# Patient Record
Sex: Female | Born: 1935 | Race: White | Hispanic: No | Smoking: Never smoker
Health system: Southern US, Community
[De-identification: ages and names within clinical notes are randomized; demographics above are authoritative.]

## PROBLEM LIST (undated history)

## (undated) DIAGNOSIS — C50919 Malignant neoplasm of unspecified site of unspecified female breast: Secondary | ICD-10-CM

## (undated) DIAGNOSIS — Z923 Personal history of irradiation: Secondary | ICD-10-CM

## (undated) DIAGNOSIS — L570 Actinic keratosis: Secondary | ICD-10-CM

## (undated) DIAGNOSIS — B0239 Other herpes zoster eye disease: Secondary | ICD-10-CM

## (undated) DIAGNOSIS — C801 Malignant (primary) neoplasm, unspecified: Secondary | ICD-10-CM

## (undated) DIAGNOSIS — I2699 Other pulmonary embolism without acute cor pulmonale: Secondary | ICD-10-CM

## (undated) DIAGNOSIS — E785 Hyperlipidemia, unspecified: Secondary | ICD-10-CM

## (undated) HISTORY — PX: COLPOSCOPY: SHX161

## (undated) HISTORY — DX: Other herpes zoster eye disease: B02.39

## (undated) HISTORY — DX: Actinic keratosis: L57.0

## (undated) HISTORY — DX: Hyperlipidemia, unspecified: E78.5

## (undated) HISTORY — DX: Malignant (primary) neoplasm, unspecified: C80.1

---
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
## 2000-09-08 DIAGNOSIS — C50919 Malignant neoplasm of unspecified site of unspecified female breast: Secondary | ICD-10-CM

## 2000-09-08 DIAGNOSIS — C801 Malignant (primary) neoplasm, unspecified: Secondary | ICD-10-CM

## 2000-09-08 DIAGNOSIS — Z923 Personal history of irradiation: Secondary | ICD-10-CM

## 2000-09-08 HISTORY — PX: BREAST BIOPSY: SHX20

## 2000-09-08 HISTORY — DX: Malignant neoplasm of unspecified site of unspecified female breast: C50.919

## 2000-09-08 HISTORY — DX: Personal history of irradiation: Z92.3

## 2000-09-08 HISTORY — PX: BREAST SURGERY: SHX581

## 2000-09-08 HISTORY — DX: Malignant (primary) neoplasm, unspecified: C80.1

## 2004-07-02 ENCOUNTER — Ambulatory Visit: Payer: Self-pay | Admitting: Oncology

## 2004-07-09 ENCOUNTER — Ambulatory Visit: Payer: Self-pay | Admitting: Oncology

## 2004-12-31 ENCOUNTER — Ambulatory Visit: Payer: Self-pay | Admitting: Oncology

## 2005-01-06 ENCOUNTER — Ambulatory Visit: Payer: Self-pay | Admitting: Oncology

## 2005-02-26 ENCOUNTER — Ambulatory Visit: Payer: Self-pay | Admitting: Oncology

## 2005-04-28 ENCOUNTER — Ambulatory Visit: Payer: Self-pay | Admitting: Radiation Oncology

## 2005-07-01 ENCOUNTER — Ambulatory Visit: Payer: Self-pay | Admitting: Oncology

## 2005-12-29 ENCOUNTER — Ambulatory Visit: Payer: Self-pay | Admitting: Oncology

## 2006-01-06 ENCOUNTER — Ambulatory Visit: Payer: Self-pay | Admitting: Oncology

## 2006-03-02 ENCOUNTER — Ambulatory Visit: Payer: Self-pay | Admitting: Oncology

## 2006-03-26 ENCOUNTER — Ambulatory Visit: Payer: Self-pay | Admitting: Oncology

## 2006-04-08 ENCOUNTER — Ambulatory Visit: Payer: Self-pay | Admitting: Oncology

## 2006-06-29 ENCOUNTER — Ambulatory Visit: Payer: Self-pay | Admitting: Oncology

## 2006-07-09 ENCOUNTER — Ambulatory Visit: Payer: Self-pay | Admitting: Oncology

## 2006-09-09 ENCOUNTER — Ambulatory Visit: Payer: Self-pay | Admitting: Oncology

## 2007-01-07 ENCOUNTER — Ambulatory Visit: Payer: Self-pay | Admitting: Oncology

## 2007-01-11 ENCOUNTER — Ambulatory Visit: Payer: Self-pay | Admitting: Oncology

## 2007-02-07 ENCOUNTER — Ambulatory Visit: Payer: Self-pay | Admitting: Oncology

## 2007-04-07 ENCOUNTER — Ambulatory Visit: Payer: Self-pay | Admitting: Oncology

## 2007-07-10 ENCOUNTER — Ambulatory Visit: Payer: Self-pay | Admitting: Oncology

## 2007-07-12 ENCOUNTER — Ambulatory Visit: Payer: Self-pay | Admitting: Oncology

## 2007-08-09 ENCOUNTER — Ambulatory Visit: Payer: Self-pay | Admitting: Oncology

## 2008-01-07 ENCOUNTER — Ambulatory Visit: Payer: Self-pay | Admitting: Oncology

## 2008-01-10 ENCOUNTER — Ambulatory Visit: Payer: Self-pay | Admitting: Oncology

## 2008-02-07 ENCOUNTER — Ambulatory Visit: Payer: Self-pay | Admitting: Oncology

## 2008-04-08 ENCOUNTER — Ambulatory Visit: Payer: Self-pay | Admitting: Oncology

## 2008-04-10 ENCOUNTER — Ambulatory Visit: Payer: Self-pay | Admitting: Oncology

## 2008-04-19 ENCOUNTER — Ambulatory Visit: Payer: Self-pay | Admitting: Oncology

## 2008-06-02 LAB — BASIC METABOLIC PANEL
BUN: 19 mg/dL (ref 4–21)
Creatinine: 1 mg/dL (ref 0.5–1.1)
GLUCOSE: 92 mg/dL
Potassium: 4.4 mmol/L (ref 3.4–5.3)
Sodium: 142 mmol/L (ref 137–147)

## 2008-06-02 LAB — CBC AND DIFFERENTIAL
HEMATOCRIT: 40 % (ref 36–46)
Hemoglobin: 13.3 g/dL (ref 12.0–16.0)
NEUTROS ABS: 4 /uL
Platelets: 221 10*3/uL (ref 150–399)
WBC: 5.9 10*3/mL

## 2008-06-02 LAB — HEPATIC FUNCTION PANEL
ALK PHOS: 68 U/L (ref 25–125)
ALT: 15 U/L (ref 7–35)
AST: 18 U/L (ref 13–35)
Bilirubin, Total: 0.5 mg/dL

## 2008-06-14 ENCOUNTER — Ambulatory Visit: Payer: Self-pay | Admitting: Oncology

## 2008-07-09 ENCOUNTER — Ambulatory Visit: Payer: Self-pay | Admitting: Oncology

## 2008-07-25 ENCOUNTER — Ambulatory Visit: Payer: Self-pay | Admitting: Oncology

## 2008-08-08 ENCOUNTER — Ambulatory Visit: Payer: Self-pay | Admitting: Oncology

## 2009-02-06 ENCOUNTER — Ambulatory Visit: Payer: Self-pay | Admitting: Oncology

## 2009-02-07 ENCOUNTER — Ambulatory Visit: Payer: Self-pay | Admitting: Oncology

## 2009-03-08 ENCOUNTER — Ambulatory Visit: Payer: Self-pay | Admitting: Oncology

## 2009-04-23 ENCOUNTER — Ambulatory Visit: Payer: Self-pay | Admitting: Oncology

## 2009-05-07 ENCOUNTER — Ambulatory Visit: Payer: Self-pay | Admitting: Oncology

## 2009-05-09 ENCOUNTER — Ambulatory Visit: Payer: Self-pay | Admitting: Oncology

## 2009-05-17 ENCOUNTER — Ambulatory Visit: Payer: Self-pay | Admitting: Oncology

## 2009-06-08 ENCOUNTER — Ambulatory Visit: Payer: Self-pay | Admitting: Oncology

## 2009-08-08 ENCOUNTER — Ambulatory Visit: Payer: Self-pay | Admitting: Oncology

## 2009-08-15 ENCOUNTER — Ambulatory Visit: Payer: Self-pay | Admitting: Oncology

## 2009-08-21 LAB — LIPID PANEL
CHOLESTEROL: 211 mg/dL — AB (ref 0–200)
HDL: 58 mg/dL (ref 35–70)
LDL Cholesterol: 141 mg/dL
LDl/HDL Ratio: 2.4
Triglycerides: 60 mg/dL (ref 40–160)

## 2009-09-08 ENCOUNTER — Ambulatory Visit: Payer: Self-pay | Admitting: Oncology

## 2009-11-06 ENCOUNTER — Ambulatory Visit: Payer: Self-pay | Admitting: General Surgery

## 2010-02-06 ENCOUNTER — Ambulatory Visit: Payer: Self-pay | Admitting: Oncology

## 2010-02-13 ENCOUNTER — Ambulatory Visit: Payer: Self-pay | Admitting: Oncology

## 2010-03-08 ENCOUNTER — Ambulatory Visit: Payer: Self-pay | Admitting: Oncology

## 2010-08-15 ENCOUNTER — Ambulatory Visit: Payer: Self-pay | Admitting: Oncology

## 2010-08-16 LAB — CANCER ANTIGEN 27.29: CA 27.29: 11.2 U/mL (ref 0.0–38.6)

## 2010-09-08 ENCOUNTER — Ambulatory Visit: Payer: Self-pay | Admitting: Oncology

## 2010-11-11 ENCOUNTER — Ambulatory Visit: Payer: Self-pay | Admitting: Oncology

## 2011-02-17 ENCOUNTER — Ambulatory Visit: Payer: Self-pay | Admitting: Oncology

## 2011-02-18 LAB — CANCER ANTIGEN 27.29: CA 27.29: 7.1 U/mL (ref 0.0–38.6)

## 2011-03-09 ENCOUNTER — Ambulatory Visit: Payer: Self-pay | Admitting: Oncology

## 2011-08-19 ENCOUNTER — Ambulatory Visit: Payer: Self-pay | Admitting: Oncology

## 2011-09-09 ENCOUNTER — Ambulatory Visit: Payer: Self-pay | Admitting: Oncology

## 2011-11-12 ENCOUNTER — Ambulatory Visit: Payer: Self-pay | Admitting: Oncology

## 2012-02-17 ENCOUNTER — Ambulatory Visit: Payer: Self-pay | Admitting: Oncology

## 2012-02-18 LAB — CANCER ANTIGEN 27.29: CA 27.29: 12.2 U/mL (ref 0.0–38.6)

## 2012-03-08 ENCOUNTER — Ambulatory Visit: Payer: Self-pay | Admitting: Oncology

## 2012-11-15 ENCOUNTER — Ambulatory Visit: Payer: Self-pay | Admitting: Oncology

## 2013-02-15 ENCOUNTER — Ambulatory Visit: Payer: Self-pay | Admitting: Oncology

## 2013-03-08 ENCOUNTER — Ambulatory Visit: Payer: Self-pay | Admitting: Oncology

## 2013-06-02 ENCOUNTER — Ambulatory Visit: Payer: Self-pay | Admitting: Ophthalmology

## 2013-06-02 DIAGNOSIS — I10 Essential (primary) hypertension: Secondary | ICD-10-CM

## 2013-06-15 ENCOUNTER — Ambulatory Visit: Payer: Self-pay | Admitting: Ophthalmology

## 2013-07-20 ENCOUNTER — Ambulatory Visit: Payer: Self-pay | Admitting: Ophthalmology

## 2013-11-16 ENCOUNTER — Ambulatory Visit: Payer: Self-pay | Admitting: Oncology

## 2014-02-20 ENCOUNTER — Ambulatory Visit: Payer: Self-pay | Admitting: Oncology

## 2014-02-21 LAB — CANCER ANTIGEN 27.29: CA 27.29: 13.5 U/mL (ref 0.0–38.6)

## 2014-03-08 ENCOUNTER — Ambulatory Visit: Payer: Self-pay | Admitting: Oncology

## 2014-11-21 ENCOUNTER — Ambulatory Visit: Payer: Self-pay | Admitting: Oncology

## 2014-12-11 ENCOUNTER — Encounter: Payer: Self-pay | Admitting: General Surgery

## 2014-12-13 ENCOUNTER — Ambulatory Visit (INDEPENDENT_AMBULATORY_CARE_PROVIDER_SITE_OTHER): Payer: Medicare Other | Admitting: General Surgery

## 2014-12-13 ENCOUNTER — Encounter: Payer: Self-pay | Admitting: General Surgery

## 2014-12-13 VITALS — BP 122/68 | HR 64 | Resp 14 | Ht 62.0 in | Wt 170.0 lb

## 2014-12-13 DIAGNOSIS — K6289 Other specified diseases of anus and rectum: Secondary | ICD-10-CM | POA: Insufficient documentation

## 2014-12-13 DIAGNOSIS — R198 Other specified symptoms and signs involving the digestive system and abdomen: Secondary | ICD-10-CM | POA: Diagnosis not present

## 2014-12-13 LAB — POC HEMOCCULT BLD/STL (OFFICE/1-CARD/DIAGNOSTIC): Fecal Occult Blood, POC: NEGATIVE

## 2014-12-13 MED ORDER — DOXYCYCLINE HYCLATE 100 MG PO CAPS: 100.0000 mg | ORAL_CAPSULE | Freq: Two times a day (BID) | ORAL | Status: DC

## 2014-12-13 NOTE — Patient Instructions (Signed)
The patient is aware to call back for any questions or concerns.  

## 2014-12-13 NOTE — Progress Notes (Signed)
Patient ID: JAVAEH Powell, female   DOB: 08-19-1934, 81 y.o.   MRN: [id number]  Chief Complaint  Patient presents with  . Other    hemorrhoids    HPI Renee Powell is a 81 y.o. female here today for a evaluation of possible hemorrhoids. Patient states she noticed this about a month ago. She states there is "something protruding from rectum". She states it has not changed in size. No bleeding. Bowels move generally every other day. She states she would have a BM and then about one hour later she would notice more BM that would seep out. She has been using some cream for about 2 weeks which has not helped.  She has never had a colonoscopy.   HPI  Past Medical History  Diagnosis Date  . Cancer 2002    breast    Past Surgical History  Procedure Laterality Date  . Breast surgery Right 2002    lumpectomy    Family History  Problem Relation Age of Onset  . Cancer Father     Social History History  Substance Use Topics  . Smoking status: Never Smoker   . Smokeless tobacco: Never Used  . Alcohol Use: No    No Known Allergies  Current Outpatient Prescriptions  Medication Sig Dispense Refill  . PROCTOSOL HC 2.5 % rectal cream   0  . SYNTHROID 50 MCG tablet   11  . doxycycline (VIBRAMYCIN) 100 MG capsule Take 1 capsule (100 mg total) by mouth 2 (two) times daily. 20 capsule 0   No current facility-administered medications for this visit.    Review of Systems Review of Systems  Constitutional: Negative.   Respiratory: Negative.   Cardiovascular: Negative.   Gastrointestinal: Negative.     Blood pressure 122/68, pulse 64, resp. rate 14, height 5\' 2"  (1.575 m), weight 170 lb (77.111 kg).  Physical Exam Physical Exam  Constitutional: She is oriented to person, place, and time. She appears well-developed and well-nourished.  Cardiovascular: Normal rate, regular rhythm and normal heart sounds.   Pulses:      Femoral pulses are 2+ on the right side, and 2+ on the left  side. Pulmonary/Chest: Effort normal and breath sounds normal.  Abdominal: Soft. Normal appearance. There is no tenderness.  Genitourinary: Rectal exam shows no external hemorrhoid, no fissure, no mass and anal tone normal. Guaiac negative stool.     Tiny skin cyst at left anterior perianal skin.  With vigorous straining no prolapsing tissue through the sphincter. Normal sphincter tone. No spasm. No rectal tenderness. No rectal masses within reach of the index finger. Stool was brown, Hemoccult negative.  Anoscopy lower rectal mucosa. No internal hemorrhoids. No fissure.  Neurological: She is alert and oriented to person, place, and time.  Skin: Skin is warm and dry.    Data Reviewed Face-to-face discussion with PCP.  Assessment    Possible dermal skin cyst with local inflammation. No explanation for reported intermittent stool seepage.    Plan    The patient will be placed on doxycycline 100 mg by mouth twice a day for a ten-day course. We'll reassess in 2 weeks. She'll continue to use her hydrocortisone cream as previously instructed.     PCP:  Cranford Mon, Federico Flake 12/13/2014, 4:40 PM

## 2014-12-20 ENCOUNTER — Ambulatory Visit: Payer: Self-pay | Admitting: General Surgery

## 2014-12-26 ENCOUNTER — Encounter: Payer: Self-pay | Admitting: General Surgery

## 2014-12-26 ENCOUNTER — Ambulatory Visit (INDEPENDENT_AMBULATORY_CARE_PROVIDER_SITE_OTHER): Payer: Medicare Other | Admitting: General Surgery

## 2014-12-26 VITALS — BP 110/64 | HR 64 | Resp 12 | Ht 62.0 in | Wt 170.0 lb

## 2014-12-26 DIAGNOSIS — R198 Other specified symptoms and signs involving the digestive system and abdomen: Secondary | ICD-10-CM | POA: Diagnosis not present

## 2014-12-26 DIAGNOSIS — K6289 Other specified diseases of anus and rectum: Secondary | ICD-10-CM

## 2014-12-26 NOTE — Progress Notes (Signed)
Patient ID: Renee Powell, female   DOB: [birth year], 81 y.o.   MRN: [id number]  Chief Complaint  Patient presents with  . Follow-up    perianal cyst    HPI Renee Powell is a 81 y.o. female.  Patient here for two week follow up perianal cyst. Patient states she is doing well. She tolerated the trial of doxycycline with only mild nausea, no vomiting. She was able to complete the prescription. She reports no longer sensing anything protruding from the anus and much less local anal discomfort. HPI  Past Medical History  Diagnosis Date  . Cancer 2002    breast    Past Surgical History  Procedure Laterality Date  . Breast surgery Right 2002    lumpectomy    Family History  Problem Relation Age of Onset  . Cancer Father     Social History History  Substance Use Topics  . Smoking status: Never Smoker   . Smokeless tobacco: Never Used  . Alcohol Use: No    No Known Allergies  Current Outpatient Prescriptions  Medication Sig Dispense Refill  . PROCTOSOL HC 2.5 % rectal cream   0  . SYNTHROID 50 MCG tablet   11   No current facility-administered medications for this visit.    Review of Systems Review of Systems  Constitutional: Negative.   Respiratory: Negative.   Cardiovascular: Negative.     Blood pressure 110/64, pulse 64, resp. rate 12, height 5\' 2"  (1.575 m), weight 170 lb (77.111 kg).  Physical Exam Physical Exam  Constitutional: She is oriented to person, place, and time. She appears well-developed and well-nourished.  Eyes: Conjunctivae are normal. Pupils are equal, round, and reactive to light.  Cardiovascular: Normal rate, regular rhythm and normal heart sounds.   Pulmonary/Chest: Effort normal and breath sounds normal.  Genitourinary:     Neurological: She is alert and oriented to person, place, and time.  Skin: Skin is warm and dry.      Assessment    Resolution of perianal pain.    Plan    The patient will report if she has any  recurrent symptoms. Follow up otherwise will be on an as-needed basis.  PCP: Philemon Kingdom 12/27/2014, 7:30 AM

## 2014-12-29 NOTE — Op Note (Signed)
PATIENT NAME:  Renee Powell, Renee Powell MR#:  [id number] DATE OF BIRTH:  01/23/1934  DATE OF PROCEDURE:  07/20/2013  PREOPERATIVE DIAGNOSIS:  Senile cataract left eye.  POSTOPERATIVE DIAGNOSIS:  Senile cataract left eye.  PROCEDURE:  Phacoemulsification with posterior chamber intraocular lens implantation of the left eye.  LENS IMPLANT:  ZCBOO 12.5-diopter posterior chamber intraocular lens.  ULTRASOUND TIME:  13% of  55 seconds.  CDE 7.0.  SURGEON:  Mali Deontrey Massi, MD  ANESTHESIA:  Topical with tetracaine drops and 2% Xylocaine jelly.  COMPLICATIONS:  None.  DESCRIPTION OF PROCEDURE:  The patient was identified in the holding room and transported to the operating room and placed in the supine position under the operating microscope.  The left eye was identified as the operative eye and it was prepped and draped in the usual sterile ophthalmic fashion.  A 1 millimeter clear-corneal paracentesis was made at the 1:30 position.  The anterior chamber was filled with Viscoat viscoelastic.  A 2.4 millimeter keratome was used to make a near-clear corneal incision at the 10:30 position.  A curvilinear capsulorrhexis was made with a cystotome and capsulorrhexis forceps.  Balanced salt solution was used to hydrodissect and hydrodelineate the nucleus.  Phacoemulsification was then used in stop and chop fashion to remove the lens nucleus and epinucleus.  The remaining cortex was then removed using the irrigation and aspiration handpiece. Provisc was then placed into the capsular bag to distend it for lens placement.  A ZCBOO 12.5-diopter lens was then injected into the capsular bag.  The remaining viscoelastic was aspirated.  Wounds were hydrated with balanced salt solution.  The anterior chamber was inflated to a physiologic pressure with balanced salt solution.  0.1 mL of cefuroxime 10 mg/mL were injected into the anterior chamber for a dose of 1 mg of intracameral antibiotic at the completion of the case.  Miostat was placed into the anterior chamber to constrict the pupil.  No wound leaks were noted.  Topical Vigamox drops and Maxitrol ointment were applied to the eye.    The patient was taken to the recovery room in stable condition without complications of anesthesia or surgery.   ____________________________ Wyonia Hough, MD crb:cs D: 07/20/2013 14:45:09 ET T: 07/20/2013 16:06:38 ET JOB#: [id number]  cc: Wyonia Hough, MD, <Dictator> Leandrew Koyanagi MD ELECTRONICALLY SIGNED 07/27/2013 12:22

## 2014-12-29 NOTE — Op Note (Signed)
PATIENT NAME:  Renee Powell, GAL MR#:  [id number] DATE OF BIRTH:  [birth year]  DATE OF PROCEDURE:  06/15/2013  PREOPERATIVE DIAGNOSIS:  Senile cataract, right eye.  POSTOPERATIVE DIAGNOSIS:  Senile cataract, right eye.  PROCEDURE:  Phacoemulsification with posterior chamber intraocular lens implantation of the right eye.  LENS:  ZCB00 13.0-diopter posterior chamber intraocular lens.  ULTRASOUND TIME:  13% of 1 minute, 12 seconds.  CDE 9.2.  SURGEON:  Mali Angila Wombles, MD  ANESTHESIA:  Topical with tetracaine drops and 2% Xylocaine jelly.  COMPLICATIONS:  None.  DESCRIPTION OF PROCEDURE:  The patient was identified in the holding room and transported to the operating room and placed in the supine position under the operating microscope.  The right eye was identified as the operative eye and it was prepped and draped in the usual sterile ophthalmic fashion.  A 1 millimeter clear-corneal paracentesis was made at the 12 o'clock  position.  The anterior chamber was filled with Viscoat viscoelastic.  A 2.4 millimeter keratome was used to make a near-clear corneal incision at the 9 o'clock  position.  A curvilinear capsulorrhexis was made with a cystotome and capsulorrhexis forceps.  Balanced salt solution was used to hydrodissect and hydrodelineate the nucleus.  Phacoemulsification was then used in stop and chop fashion to remove the lens nucleus and epinucleus.  The remaining cortex was then removed using the irrigation and aspiration handpiece. Provisc was then placed into the capsular bag to distend it for lens placement.  A ZCB00 13.5-diopter lens was then injected into the capsular bag.  The remaining viscoelastic was aspirated.  Wounds were hydrated with balanced salt solution.  The anterior chamber was inflated to a physiologic pressure with balanced salt solution.  0.1 mL of cefuroxime 10 mg/mL were injected into the anterior chamber for a dose of 1 mg of intracameral antibiotic at the  completion of the case. Miostat was placed into the anterior chamber to constrict the pupil.  No wound leaks were noted.  Topical Vigamox drops and Maxitrol ointment were applied to the eye.  The patient was taken to the recovery room in stable condition without complications of anesthesia or surgery.    ____________________________ Wyonia Hough, MD crb:dmm D: 06/15/2013 15:24:00 ET T: 06/15/2013 16:30:21 ET JOB#: [id number]  cc: Wyonia Hough, MD, <Dictator>   Leandrew Koyanagi MD ELECTRONICALLY SIGNED 06/22/2013 12:48

## 2015-02-22 ENCOUNTER — Ambulatory Visit: Payer: Self-pay | Admitting: Oncology

## 2015-03-22 ENCOUNTER — Inpatient Hospital Stay: Payer: Medicare Other | Attending: Oncology | Admitting: Oncology

## 2015-03-22 ENCOUNTER — Encounter: Payer: Self-pay | Admitting: Oncology

## 2015-03-22 VITALS — BP 146/85 | HR 61 | Temp 95.6°F | Resp 18 | Ht 65.0 in | Wt 164.2 lb

## 2015-03-22 DIAGNOSIS — Z79899 Other long term (current) drug therapy: Secondary | ICD-10-CM | POA: Insufficient documentation

## 2015-03-22 DIAGNOSIS — Z853 Personal history of malignant neoplasm of breast: Secondary | ICD-10-CM | POA: Diagnosis present

## 2015-03-22 DIAGNOSIS — M8580 Other specified disorders of bone density and structure, unspecified site: Secondary | ICD-10-CM | POA: Diagnosis not present

## 2015-03-22 DIAGNOSIS — Z809 Family history of malignant neoplasm, unspecified: Secondary | ICD-10-CM | POA: Diagnosis not present

## 2015-03-22 DIAGNOSIS — C50911 Malignant neoplasm of unspecified site of right female breast: Secondary | ICD-10-CM

## 2015-04-07 DIAGNOSIS — C50911 Malignant neoplasm of unspecified site of right female breast: Secondary | ICD-10-CM | POA: Insufficient documentation

## 2015-04-07 NOTE — Progress Notes (Signed)
Renee Powell  Telephone:([phone]) [phone] Fax:(336) [phone]  ID: Renee Powell OB: [birth year]  MR#: [id number]  SEG#:[id number]  Patient Care Team: Jerrol Banana., MD as PCP - General (Family Medicine) Jerrol Banana., MD (Family Medicine) Robert Bellow, MD (General Surgery)  CHIEF COMPLAINT:  Chief Complaint  Patient presents with  . Follow-up    Breast Cancer    INTERVAL HISTORY: Patient returns to clinic today for routine yearly followup and evaluation.  She continues to feel well and remains asymptomatic.  She denies any neurologic complaints.  She has no fevers, chills, night sweats, or weight loss. She has a good appetite. She denies any chest pain, shortness of breath, or cough.  She denies any nausea, vomiting, constipation, or diarrhea.  She has no bony pain.  Patient feels at her baseline today and offers no specific complaints today.  REVIEW OF SYSTEMS:   Review of Systems  Constitutional: Negative.   Respiratory: Negative.   Cardiovascular: Negative.   Musculoskeletal: Negative.     As per HPI. Otherwise, a complete review of systems is negatve.  PAST MEDICAL HISTORY: Past Medical History  Diagnosis Date  . Cancer 2002    breast    PAST SURGICAL HISTORY: Past Surgical History  Procedure Laterality Date  . Breast surgery Right 2002    lumpectomy    FAMILY HISTORY Family History  Problem Relation Age of Onset  . Cancer Father   . Cancer Maternal Uncle   . Cancer Paternal Aunt     Breast  . Cancer Paternal Uncle     Kidney  . Cancer Paternal Grandmother     Breast  . Cancer Paternal Uncle     Lung       ADVANCED DIRECTIVES:    HEALTH MAINTENANCE: History  Substance Use Topics  . Smoking status: Never Smoker   . Smokeless tobacco: Never Used  . Alcohol Use: No     Colonoscopy:  PAP:  Bone density:  Lipid panel:  No Known Allergies  Current Outpatient Prescriptions  Medication Sig Dispense Refill    . SYNTHROID 50 MCG tablet   11   No current facility-administered medications for this visit.    OBJECTIVE: Filed Vitals:   03/22/15 1124  BP: 146/85  Pulse: 61  Temp: 95.6 F (35.3 C)  Resp: 18     Body mass index is 27.33 kg/(m^2).    ECOG FS:0 - Asymptomatic  General: Well-developed, well-nourished, no acute distress. Eyes: Pink conjunctiva, anicteric sclera. Breasts: Bilateral breast and axilla without lumps or masses. Lungs: Clear to auscultation bilaterally. Heart: Regular rate and rhythm. No rubs, murmurs, or gallops. Abdomen: Soft, nontender, nondistended. No organomegaly noted, normoactive bowel sounds. Musculoskeletal: No edema, cyanosis, or clubbing. Neuro: Alert, answering all questions appropriately. Cranial nerves grossly intact. Skin: No rashes or petechiae noted. Psych: Normal affect.   LAB RESULTS:  No results found for: NA, K, CL, CO2, GLUCOSE, BUN, CREATININE, CALCIUM, PROT, ALBUMIN, AST, ALT, ALKPHOS, BILITOT, GFRNONAA, GFRAA  No results found for: WBC, NEUTROABS, HGB, HCT, MCV, PLT   STUDIES: No results found.  ASSESSMENT: Stage IIa adenocarcinoma the right breast status post lumpectomy and XRT.  PLAN:    1. Breast cancer: No evidence of disease. Patient's most recent mammogram on November 21, 2014 was reported as BI-RADS 2.  Repeat in March 2017. Return to clinic in one year for routine evaluation.  Patient has now completed her NSABP protocol and will be in yearly followup until  2017, at which time she can be discharged from clinic.  She understands she can to return to clinic at any time if she has any questions, concerns, or complaints. 2.  Osteopenia: Previously, patient discontinued her Fosamax and does not wish to restart.  Continue calcium and vitamin D. Patient refuses any further bone mineral density examinations.   Patient expressed understanding and was in agreement with this plan. She also understands that She can call clinic at any time  with any questions, concerns, or complaints.   Breast cancer   Staging form: Breast, AJCC 7th Edition     Clinical stage from 04/07/2015: Stage IIA (T2, N0, M0) - Signed by Lloyd Huger, MD on 04/07/2015   Lloyd Huger, MD   04/07/2015 2:39 PM

## 2015-06-05 ENCOUNTER — Ambulatory Visit: Payer: Self-pay | Admitting: Family Medicine

## 2015-07-04 DIAGNOSIS — E785 Hyperlipidemia, unspecified: Secondary | ICD-10-CM | POA: Insufficient documentation

## 2015-07-04 DIAGNOSIS — L720 Epidermal cyst: Secondary | ICD-10-CM | POA: Insufficient documentation

## 2015-07-04 DIAGNOSIS — E079 Disorder of thyroid, unspecified: Secondary | ICD-10-CM | POA: Insufficient documentation

## 2015-07-11 ENCOUNTER — Encounter: Payer: Self-pay | Admitting: Family Medicine

## 2015-07-11 ENCOUNTER — Ambulatory Visit (INDEPENDENT_AMBULATORY_CARE_PROVIDER_SITE_OTHER): Payer: Medicare Other | Admitting: Family Medicine

## 2015-07-11 VITALS — BP 122/62 | HR 68 | Temp 97.5°F | Resp 16 | Wt 165.0 lb

## 2015-07-11 DIAGNOSIS — E079 Disorder of thyroid, unspecified: Secondary | ICD-10-CM

## 2015-07-11 DIAGNOSIS — E785 Hyperlipidemia, unspecified: Secondary | ICD-10-CM

## 2015-07-11 NOTE — Progress Notes (Signed)
Patient ID: Renee Powell, female   DOB: November 16, 1933, 81 y.o.   MRN: [id number]    Subjective:  HPI Pt is here for a 6 month follow up on her thyroid. According to our records she has not had labs since 2012. She will need labs today. Pt reports that she is feeling fine and is doing well.   Pt declines flu vaccine today.  Prior to Admission medications   Medication Sig Start Date End Date Taking? Authorizing Provider  SYNTHROID 50 MCG tablet  10/09/14  Yes Historical Provider, MD  fenofibrate micronized (LOFIBRA) 134 MG capsule Take by mouth.    Historical Provider, MD    Patient Active Problem List   Diagnosis Date Noted  . HLD (hyperlipidemia) 07/04/2015  . Dermoid inclusion cyst 07/04/2015  . Disease of thyroid gland 07/04/2015  . Breast cancer ([place]) 04/07/2015  . Perianal cyst 12/13/2014    Past Medical History  Diagnosis Date  . Cancer [facility]) 2002    breast    Social History   Social History  . Marital Status: Married    Spouse Name: N/A  . Number of Children: N/A  . Years of Education: N/A   Occupational History  . Not on file.   Social History Main Topics  . Smoking status: Never Smoker   . Smokeless tobacco: Never Used  . Alcohol Use: No  . Drug Use: No  . Sexual Activity: Not on file   Other Topics Concern  . Not on file   Social History Narrative    No Known Allergies  Review of Systems  Constitutional: Negative.   HENT: Negative.   Eyes: Negative.   Respiratory: Negative.   Cardiovascular: Negative.   Gastrointestinal: Negative.   Genitourinary: Negative.   Musculoskeletal: Negative.   Skin: Negative.   Neurological: Negative.   Endo/Heme/Allergies: Negative.   Psychiatric/Behavioral: Negative.      There is no immunization history on file for this patient. Objective:  BP 122/62 mmHg  Pulse 68  Temp(Src) 97.5 F (36.4 C) (Oral)  Resp 16  Wt 165 lb (74.844 kg)  Physical Exam  Constitutional: She is oriented to person, place, and  time and well-developed, well-nourished, and in no distress.  HENT:  Head: Normocephalic and atraumatic.  Right Ear: External ear normal.  Nose: Nose normal.  Eyes: Conjunctivae and EOM are normal. Pupils are equal, round, and reactive to light.  Neck: Normal range of motion. Neck supple.  Cardiovascular: Normal rate, regular rhythm, normal heart sounds and intact distal pulses.   Pulmonary/Chest: Effort normal and breath sounds normal.  Abdominal: Soft. Bowel sounds are normal.  Musculoskeletal: Normal range of motion.  Neurological: She is alert and oriented to person, place, and time. She has normal reflexes. Gait normal. GCS score is 15.  Skin: Skin is warm and dry.  Psychiatric: Mood, memory, affect and judgment normal.    Lab Results  Component Value Date   WBC 5.9 06/02/2008   HGB 13.3 06/02/2008   HCT 40 06/02/2008   PLT 221 06/02/2008   CHOL 211* 08/21/2009   TRIG 60 08/21/2009   HDL 58 08/21/2009   LDLCALC 141 08/21/2009    CMP     Component Value Date/Time   NA 142 06/02/2008   K 4.4 06/02/2008   BUN 19 06/02/2008   CREATININE 1.0 06/02/2008   AST 18 06/02/2008   ALT 15 06/02/2008   ALKPHOS 68 06/02/2008    Assessment and Plan :  1. Disease of thyroid gland  Followed by Endocrinology.   2. HLD (hyperlipidemia)  - Comprehensive metabolic panel - CBC with Differential/Platelet  Declines Prevnar and Flu vaccine 3. Health maintenance Patient has declined health maintenance for years I have done the exam and reviewed the above chart and it is accurate to the best of my knowledge.  Miguel Aschoff MD [facility] 07/11/2015 1:38 PM

## 2015-07-12 LAB — COMPREHENSIVE METABOLIC PANEL
A/G RATIO: 2 (ref 1.1–2.5)
ALT: 10 IU/L (ref 0–32)
AST: 10 IU/L (ref 0–40)
Albumin: 4.3 g/dL (ref 3.5–4.8)
Alkaline Phosphatase: 81 IU/L (ref 39–117)
BILIRUBIN TOTAL: 0.4 mg/dL (ref 0.0–1.2)
BUN / CREAT RATIO: 19 (ref 11–26)
BUN: 16 mg/dL (ref 8–27)
CHLORIDE: 103 mmol/L (ref 97–106)
CO2: 26 mmol/L (ref 18–29)
Calcium: 9.4 mg/dL (ref 8.7–10.3)
Creatinine, Ser: 0.85 mg/dL (ref 0.57–1.00)
GFR calc Af Amer: 75 mL/min/{1.73_m2} (ref >59)
GFR, EST NON AFRICAN AMERICAN: 65 mL/min/{1.73_m2} (ref >59)
Globulin, Total: 2.1 g/dL (ref 1.5–4.5)
Glucose: 83 mg/dL (ref 65–99)
Potassium: 4 mmol/L (ref 3.5–5.2)
Sodium: 144 mmol/L (ref 136–144)
TOTAL PROTEIN: 6.4 g/dL (ref 6.0–8.5)

## 2015-07-12 LAB — CBC WITH DIFFERENTIAL/PLATELET
BASOS: 1 %
Basophils Absolute: 0.1 10*3/uL (ref 0.0–0.2)
EOS (ABSOLUTE): 0.2 10*3/uL (ref 0.0–0.4)
Eos: 2 %
Hematocrit: 40.9 % (ref 34.0–46.6)
Hemoglobin: 13.6 g/dL (ref 11.1–15.9)
IMMATURE GRANS (ABS): 0 10*3/uL (ref 0.0–0.1)
IMMATURE GRANULOCYTES: 0 %
LYMPHS ABS: 2.1 10*3/uL (ref 0.7–3.1)
Lymphs: 28 %
MCH: 29.6 pg (ref 26.6–33.0)
MCHC: 33.3 g/dL (ref 31.5–35.7)
MCV: 89 fL (ref 79–97)
Monocytes Absolute: 0.7 10*3/uL (ref 0.1–0.9)
Monocytes: 9 %
Neutrophils Absolute: 4.5 10*3/uL (ref 1.4–7.0)
Neutrophils: 60 %
PLATELETS: 235 10*3/uL (ref 150–379)
RBC: 4.6 x10E6/uL (ref 3.77–5.28)
RDW: 13.9 % (ref 12.3–15.4)
WBC: 7.6 10*3/uL (ref 3.4–10.8)

## 2015-11-22 ENCOUNTER — Other Ambulatory Visit: Payer: Self-pay | Admitting: Oncology

## 2015-11-22 ENCOUNTER — Ambulatory Visit
Admission: RE | Admit: 2015-11-22 | Discharge: 2015-11-22 | Disposition: A | Discharge status: Home / Self Care | Payer: Medicare Other | Source: Ambulatory Visit | Attending: Oncology | Admitting: Oncology

## 2015-11-22 DIAGNOSIS — Z1239 Encounter for other screening for malignant neoplasm of breast: Secondary | ICD-10-CM

## 2015-11-22 DIAGNOSIS — Z1231 Encounter for screening mammogram for malignant neoplasm of breast: Secondary | ICD-10-CM | POA: Diagnosis not present

## 2015-11-22 DIAGNOSIS — C50911 Malignant neoplasm of unspecified site of right female breast: Secondary | ICD-10-CM

## 2015-11-22 HISTORY — DX: Malignant neoplasm of unspecified site of unspecified female breast: C50.919

## 2016-01-15 ENCOUNTER — Encounter: Payer: Medicare Other | Admitting: Family Medicine

## 2016-02-13 ENCOUNTER — Ambulatory Visit (INDEPENDENT_AMBULATORY_CARE_PROVIDER_SITE_OTHER): Payer: Medicare Other | Admitting: Family Medicine

## 2016-02-13 ENCOUNTER — Encounter: Payer: Self-pay | Admitting: Family Medicine

## 2016-02-13 VITALS — BP 118/64 | HR 62 | Temp 97.8°F | Resp 18 | Ht 62.0 in | Wt 165.0 lb

## 2016-02-13 DIAGNOSIS — Z0000 Encounter for general adult medical examination without abnormal findings: Secondary | ICD-10-CM

## 2016-02-13 NOTE — Progress Notes (Signed)
Patient ID: Renee Powell, female   DOB: 1934/06/25, 82 y.o.   MRN: [id number] Visit Date: 02/13/2016  Today's Provider: Wilhemena Durie, MD   Chief Complaint  Patient presents with  . Medicare Wellness   Subjective:   Renee Powell is a 82 y.o. female who presents today for her Subsequent Annual Wellness Visit. She feels well. She reports she is not exercising. She reports she is sleeping well.  Colonsocopy- Never, pt declines BMD- 10/14/00 ok Pap- 04/07/07 normal Mammogram- 11/22/15 normal, has another scheduled for 03/21/16 due to breast cancer  Pt declines pneumonia vaccines.   Review of Systems  Constitutional: Negative.   HENT: Positive for drooling and hearing loss.   Eyes: Negative.   Respiratory: Negative.   Cardiovascular: Negative.   Gastrointestinal: Negative.   Endocrine: Negative.   Genitourinary: Negative.   Musculoskeletal: Negative.   Skin: Negative.   Allergic/Immunologic: Negative.   Neurological: Negative.   Hematological: Negative.   Psychiatric/Behavioral: Negative.     Patient Active Problem List   Diagnosis Date Noted  . HLD (hyperlipidemia) 07/04/2015  . Dermoid inclusion cyst 07/04/2015  . Disease of thyroid gland 07/04/2015  . Breast cancer ([place]) 04/07/2015  . Perianal cyst 12/13/2014    Social History   Social History  . Marital Status: Married    Spouse Name: N/A  . Number of Children: N/A  . Years of Education: N/A   Occupational History  . Not on file.   Social History Main Topics  . Smoking status: Never Smoker   . Smokeless tobacco: Never Used  . Alcohol Use: No  . Drug Use: No  . Sexual Activity: Not on file   Other Topics Concern  . Not on file   Social History Narrative    Past Surgical History  Procedure Laterality Date  . Breast surgery Right 2002    lumpectomy    Her family history includes Breast cancer (age of onset: 71) in her maternal aunt and maternal grandmother; Cancer in her father, maternal uncle,  paternal aunt, paternal grandmother, paternal uncle, and paternal uncle; Heart attack in her mother.    Outpatient Prescriptions Prior to Visit  Medication Sig Dispense Refill  . SYNTHROID 50 MCG tablet   11  . fenofibrate micronized (LOFIBRA) 134 MG capsule Take by mouth. Reported on 02/13/2016     No facility-administered medications prior to visit.    No Known Allergies  Patient Care Team: Jerrol Banana., MD as PCP - General (Family Medicine) Jerrol Banana., MD (Family Medicine) Robert Bellow, MD (General Surgery)  Objective:   Vitals:  Filed Vitals:   02/13/16 0956  BP: 118/64  Pulse: 62  Temp: 97.8 F (36.6 C)  TempSrc: Oral  Resp: 18  Height: 5\' 2"  (1.575 m)  Weight: 165 lb (74.844 kg)    Physical Exam  Constitutional: She is oriented to person, place, and time. She appears well-developed and well-nourished.  HENT:  Head: Normocephalic and atraumatic.  Right Ear: External ear normal.  Left Ear: External ear normal.  Nose: Nose normal.  Mouth/Throat: Oropharynx is clear and moist.  Eyes: Conjunctivae and EOM are normal. Pupils are equal, round, and reactive to light.  Neck: Normal range of motion. Neck supple.  Cardiovascular: Normal rate, regular rhythm, normal heart sounds and intact distal pulses.   Pulmonary/Chest: Effort normal and breath sounds normal.  Abdominal: Soft. Bowel sounds are normal.  Musculoskeletal: Normal range of motion.  Neurological: She is alert and oriented  to person, place, and time. She has normal reflexes.  Skin: Skin is warm and dry.  Psychiatric: She has a normal mood and affect. Her behavior is normal. Judgment and thought content normal.    Activities of Daily Living In your present state of health, do you have any difficulty performing the following activities: 02/13/2016 02/13/2016  Hearing? Y N  Vision? N N  Difficulty concentrating or making decisions? N N  Walking or climbing stairs? N N  Dressing or  bathing? N N  Doing errands, shopping? N N    Fall Risk Assessment Fall Risk  02/13/2016 02/13/2016 07/11/2015  Falls in the past year? Yes No No  Number falls in past yr: 1 - -  Injury with Fall? No - -     Depression Screen PHQ 2/9 Scores 02/13/2016 02/13/2016 07/11/2015  PHQ - 2 Score 0 0 0    Cognitive Testing - 6-CIT    Year: 0 points  Month: 0 points  Memorize "Pia Mau, [address] [place]"  Time (within 1 hour:) 0 points  Count backwards from 20: 0 points  Name months of year: 0 points  Repeat Address: 6 points   Total Score: 6/28  Interpretation : Normal (0-7) Abnormal (8-28)    Assessment & Plan:     Annual Wellness Visit  Reviewed patient's Family Medical History Reviewed and updated list of patient's medical providers Assessment of cognitive impairment was done Assessed patient's functional ability Established a written schedule for health screening [place] Completed and Reviewed  Exercise Activities and Dietary recommendations Goals    None       There is no immunization history on file for this patient.  Health Maintenance  Topic Date Due  . TETANUS/TDAP  12/13/1954  . ZOSTAVAX  12/13/1995  . PNA vac Low Risk Adult (1 of 2 - PCV13) 12/12/2000  . INFLUENZA VACCINE  07/10/2016 (Originally 04/08/2016)  . DEXA SCAN  Completed     patient has not been in for several years. She is very clear that she wishes to come in very infrequently. She says she will be back when she needs her services. Discussed health benefits of physical activity, and encouraged her to engage in regular exercise appropriate for her age and condition.  Breast Cancer Pt sees Dr Maryjane Hurter yearly and feels well. Hypothyroid She has been followed by Dr. Carmela Rima  for years. Miguel Aschoff MD [facility] 02/13/2016 10:00  AM  ------------------------------------------------------------------------------------------------------------

## 2016-03-24 ENCOUNTER — Other Ambulatory Visit: Payer: Self-pay | Admitting: Oncology

## 2016-03-24 ENCOUNTER — Inpatient Hospital Stay: Payer: Medicare Other | Attending: Oncology | Admitting: Oncology

## 2016-03-24 VITALS — BP 140/94 | HR 60 | Temp 96.2°F | Wt 164.0 lb

## 2016-03-24 DIAGNOSIS — Z8051 Family history of malignant neoplasm of kidney: Secondary | ICD-10-CM | POA: Insufficient documentation

## 2016-03-24 DIAGNOSIS — Z1231 Encounter for screening mammogram for malignant neoplasm of breast: Secondary | ICD-10-CM

## 2016-03-24 DIAGNOSIS — Z801 Family history of malignant neoplasm of trachea, bronchus and lung: Secondary | ICD-10-CM

## 2016-03-24 DIAGNOSIS — Z803 Family history of malignant neoplasm of breast: Secondary | ICD-10-CM | POA: Insufficient documentation

## 2016-03-24 DIAGNOSIS — Z923 Personal history of irradiation: Secondary | ICD-10-CM | POA: Diagnosis not present

## 2016-03-24 DIAGNOSIS — M8580 Other specified disorders of bone density and structure, unspecified site: Secondary | ICD-10-CM | POA: Diagnosis not present

## 2016-03-24 DIAGNOSIS — Z853 Personal history of malignant neoplasm of breast: Secondary | ICD-10-CM | POA: Diagnosis not present

## 2016-03-24 DIAGNOSIS — C50911 Malignant neoplasm of unspecified site of right female breast: Secondary | ICD-10-CM

## 2016-03-24 NOTE — Progress Notes (Signed)
States is feeling well.

## 2016-03-29 NOTE — Progress Notes (Signed)
[place]  Telephone:(336) [phone] Fax:(336) [phone]  ID: Renee Powell OB: [birth year]  MR#: [id number]  [id number]  Patient Care Team: Jerrol Banana., MD as PCP - General (Family Medicine) Jerrol Banana., MD (Family Medicine) Robert Bellow, MD (General Surgery)  CHIEF COMPLAINT: Stage IIa adenocarcinoma the right breast status post lumpectomy and XRT.  INTERVAL HISTORY: Patient returns to clinic today for routine yearly followup and evaluation.  She continues to feel well and remains asymptomatic. She denies any neurologic complaints.  She has no fevers, chills, night sweats, or weight loss. She has a good appetite. She denies any chest pain, shortness of breath, or cough.  She denies any nausea, vomiting, constipation, or diarrhea.  She has no bony pain.  Patient feels at her baseline today and offers no specific complaints today.  REVIEW OF SYSTEMS:   Review of Systems  Constitutional: Negative.  Negative for fever, weight loss and malaise/fatigue.  Respiratory: Negative.  Negative for cough and shortness of breath.   Cardiovascular: Negative.  Negative for chest pain.  Gastrointestinal: Negative.  Negative for abdominal pain.  Genitourinary: Negative.   Musculoskeletal: Negative.   Neurological: Negative.  Negative for sensory change and weakness.  Psychiatric/Behavioral: Negative.     As per HPI. Otherwise, a complete review of systems is negatve.  PAST MEDICAL HISTORY: Past Medical History  Diagnosis Date  . Cancer [facility]) 2002    breast  . Breast cancer [facility]) 2002    right lumpectomy with radiation    PAST SURGICAL HISTORY: Past Surgical History  Procedure Laterality Date  . Breast surgery Right 2002    lumpectomy    FAMILY HISTORY Family History  Problem Relation Age of Onset  . Cancer Father     lung cancer  . Cancer Maternal Uncle   . Cancer Paternal Aunt     Breast  . Cancer Paternal Uncle     Kidney  .  Cancer Paternal Grandmother     Breast  . Cancer Paternal Uncle     Lung  . Heart attack Mother   . Breast cancer Maternal Aunt 60  . Breast cancer Maternal Grandmother 30       ADVANCED DIRECTIVES:    HEALTH MAINTENANCE: Social History  Substance Use Topics  . Smoking status: Never Smoker   . Smokeless tobacco: Never Used  . Alcohol Use: No     Colonoscopy:  PAP:  Bone density:  Lipid panel:  No Known Allergies  Current Outpatient Prescriptions  Medication Sig Dispense Refill  . SYNTHROID 50 MCG tablet Take 50 mcg by mouth daily before breakfast.   11   No current facility-administered medications for this visit.    OBJECTIVE: Filed Vitals:   03/24/16 1053  BP: 140/94  Pulse: 60  Temp: 96.2 F (35.7 C)     Body mass index is 29.99 kg/(m^2).    ECOG FS:0 - Asymptomatic  General: Well-developed, well-nourished, no acute distress. Eyes: Pink conjunctiva, anicteric sclera. Breasts: Bilateral breast and axilla without lumps or masses. Lungs: Clear to auscultation bilaterally. Heart: Regular rate and rhythm. No rubs, murmurs, or gallops. Abdomen: Soft, nontender, nondistended. No organomegaly noted, normoactive bowel sounds. Musculoskeletal: No edema, cyanosis, or clubbing. Neuro: Alert, answering all questions appropriately. Cranial nerves grossly intact. Skin: No rashes or petechiae noted. Psych: Normal affect.   LAB RESULTS:  Lab Results  Component Value Date   NA 144 07/11/2015   K 4.0 07/11/2015   CL 103 07/11/2015  CO2 26 07/11/2015   GLUCOSE 83 07/11/2015   BUN 16 07/11/2015   CREATININE 0.85 07/11/2015   CALCIUM 9.4 07/11/2015   PROT 6.4 07/11/2015   ALBUMIN 4.3 07/11/2015   AST 10 07/11/2015   ALT 10 07/11/2015   ALKPHOS 81 07/11/2015   BILITOT 0.4 07/11/2015   GFRNONAA 65 07/11/2015   GFRAA 75 07/11/2015    Lab Results  Component Value Date   WBC 7.6 07/11/2015   NEUTROABS 4.5 07/11/2015   HGB 13.3 06/02/2008   HCT 40.9  07/11/2015   MCV 89 07/11/2015   PLT 235 07/11/2015     STUDIES: No results found.  ASSESSMENT: Stage IIa adenocarcinoma the right breast status post lumpectomy and XRT.  PLAN:    1. Stage IIa adenocarcinoma the right breast status post lumpectomy and XRT: No evidence of disease. Patient's most recent mammogram on November 22, 2015 was reported as BI-RADS 2.  Repeat in March 2018. Discussed with patient discharge for clinic and continued monitoring by her PCP, but she has refused. Return to clinic in one year for routine evaluation.   2.  Osteopenia: Previously, patient discontinued her Fosamax and does not wish to restart. Continue calcium and vitamin D. Patient refuses any further bone mineral density examinations.  Patient expressed understanding and was in agreement with this plan. She also understands that She can call clinic at any time with any questions, concerns, or complaints.   Breast cancer   Staging form: Breast, AJCC 7th Edition     Clinical stage from 04/07/2015: Stage IIA (T2, N0, M0) - Signed by Lloyd Huger, MD on 04/07/2015   Lloyd Huger, MD   03/29/2016 9:43 AM

## 2016-06-24 ENCOUNTER — Telehealth: Payer: Self-pay | Admitting: Family Medicine

## 2016-06-24 NOTE — Telephone Encounter (Signed)
LM for pt to call and sched AWV Oct 26 if convenient, js

## 2016-08-14 ENCOUNTER — Ambulatory Visit: Payer: Medicare Other | Admitting: Family Medicine

## 2016-11-01 IMAGING — MG MM SCREENING BREAST TOMO BILATERAL
9 of 12 series · 9 of 28 positions shown · non-contrast
Comparison: Previous exam(s).

CLINICAL DATA: Screening. History of right breast cancer and right
lumpectomy in 8118.

EXAM:
DIGITAL SCREENING BILATERAL MAMMOGRAM WITH 3D TOMO WITH CAD

[L CC]
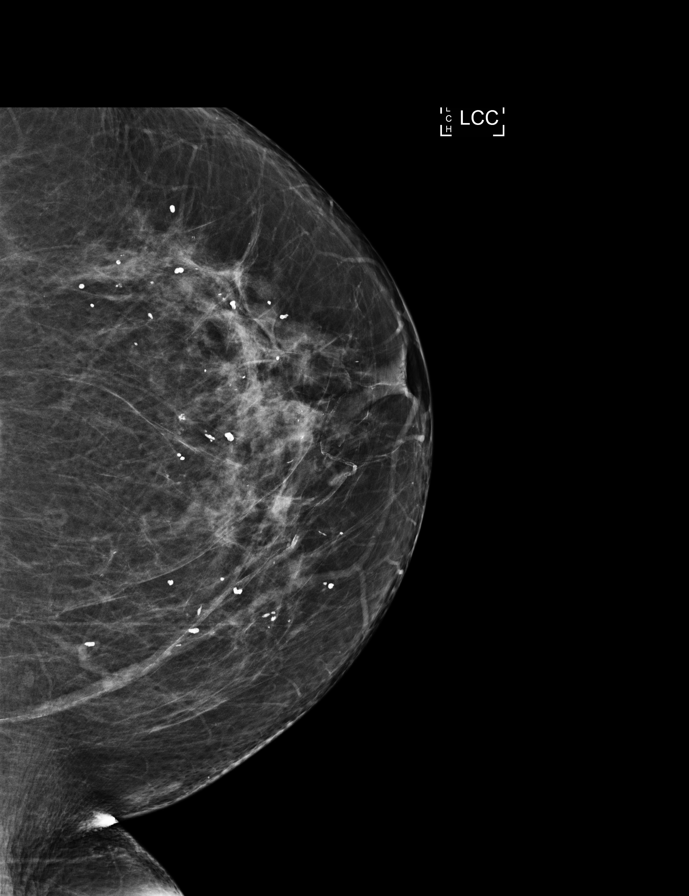

[R MLO]
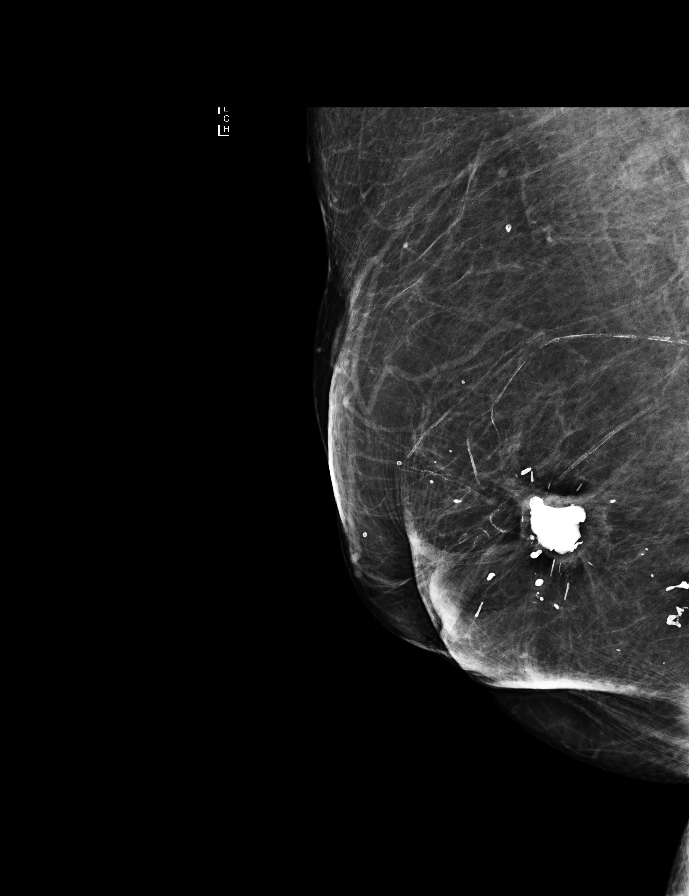

[R MLO synth-2D]
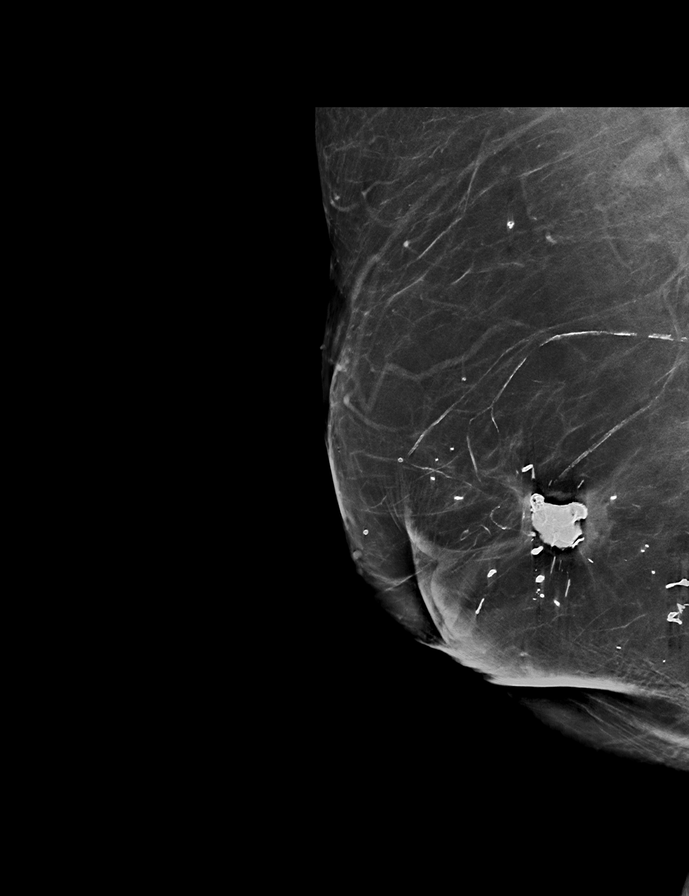

[L MLO]
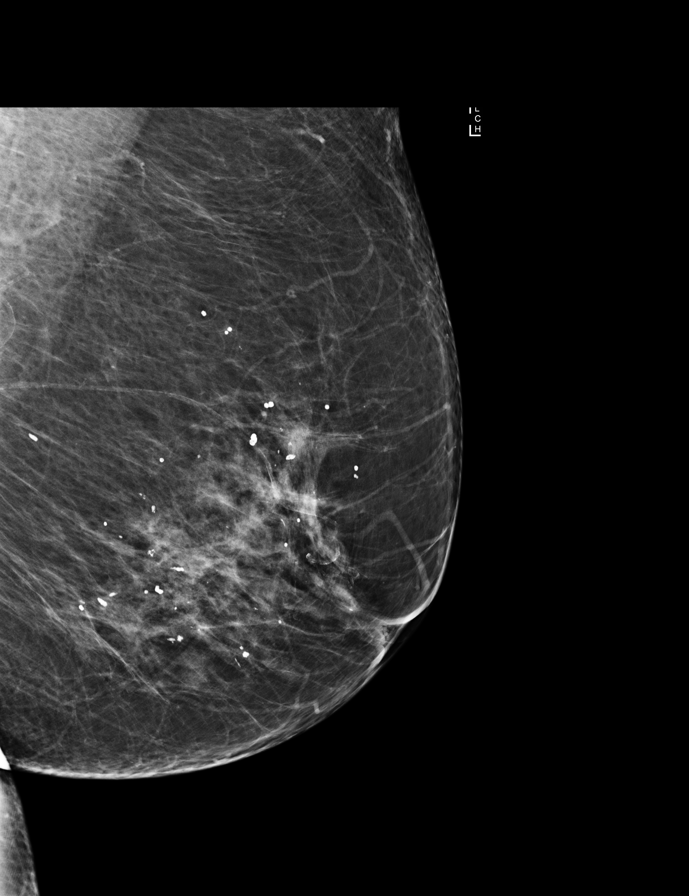

[R CC synth-2D]
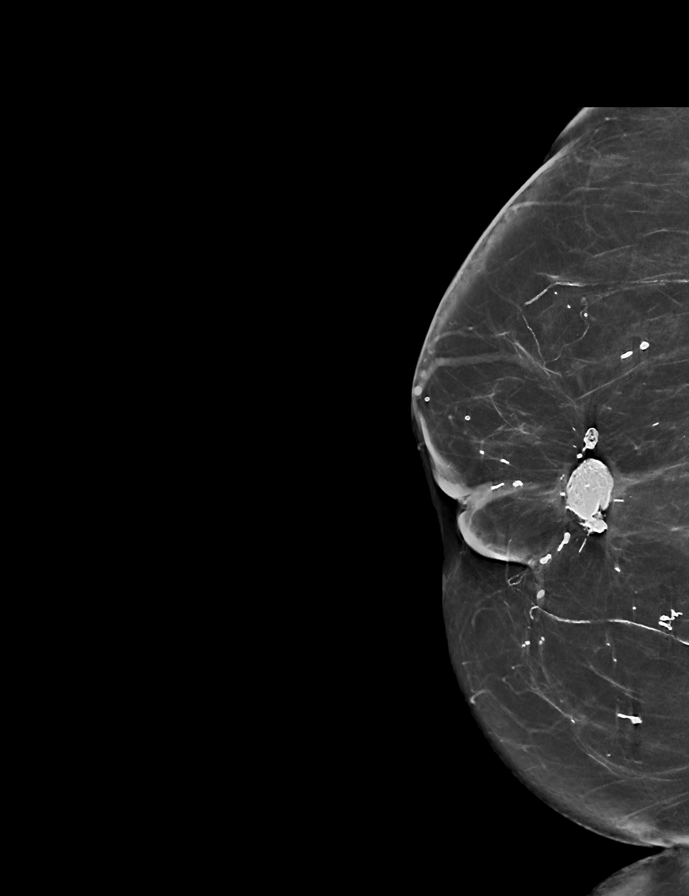

[L CC synth-2D]
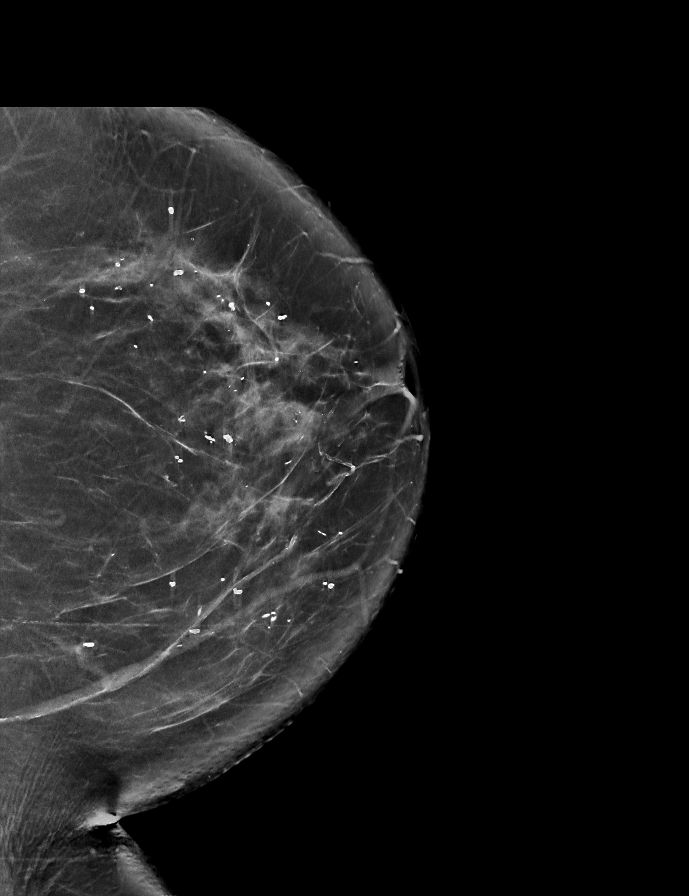

[L MLO synth-2D]
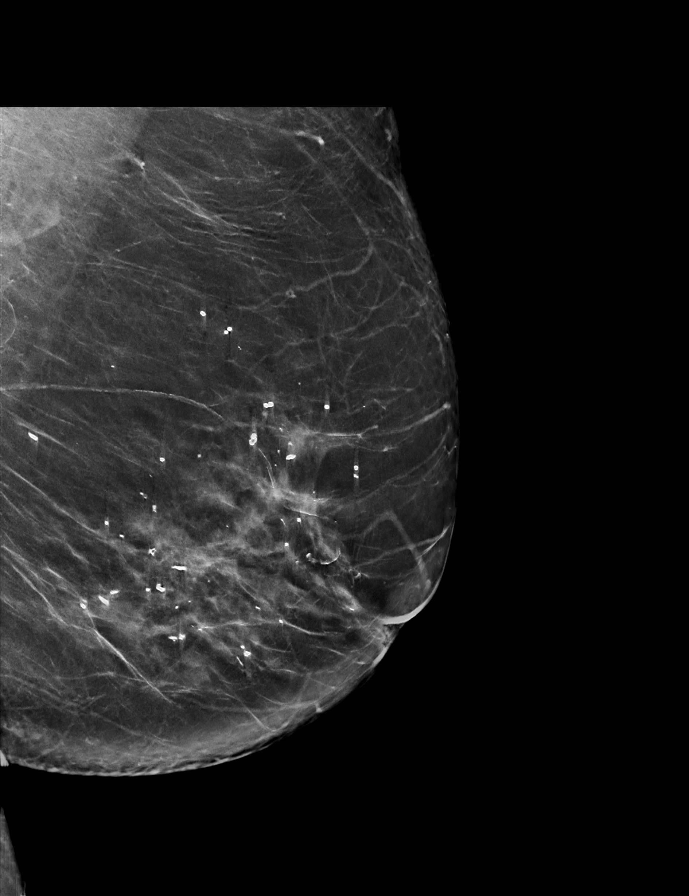

[R CC]
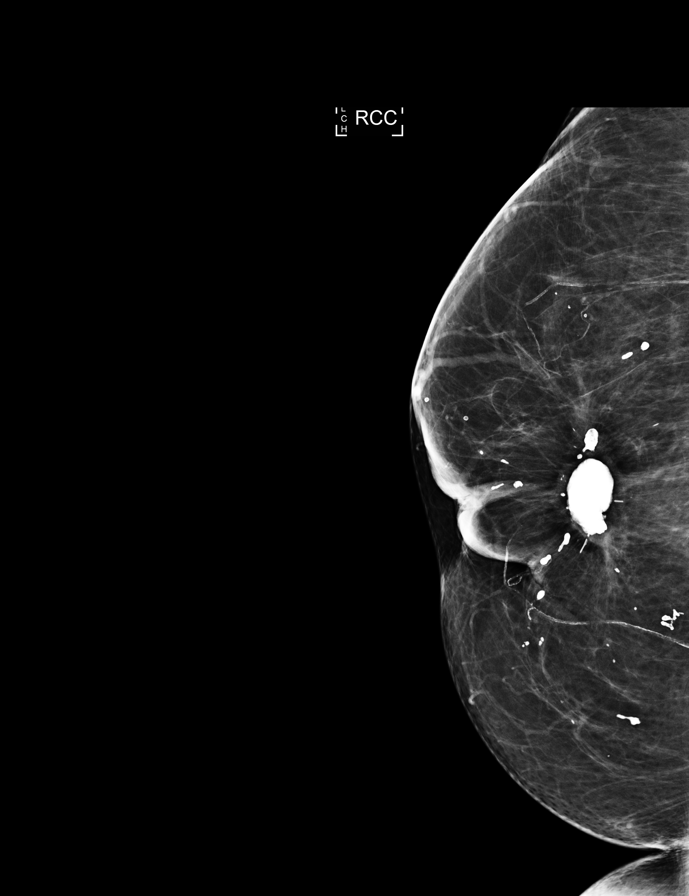

[L MLO tomo · tomo slice 40/79.0]
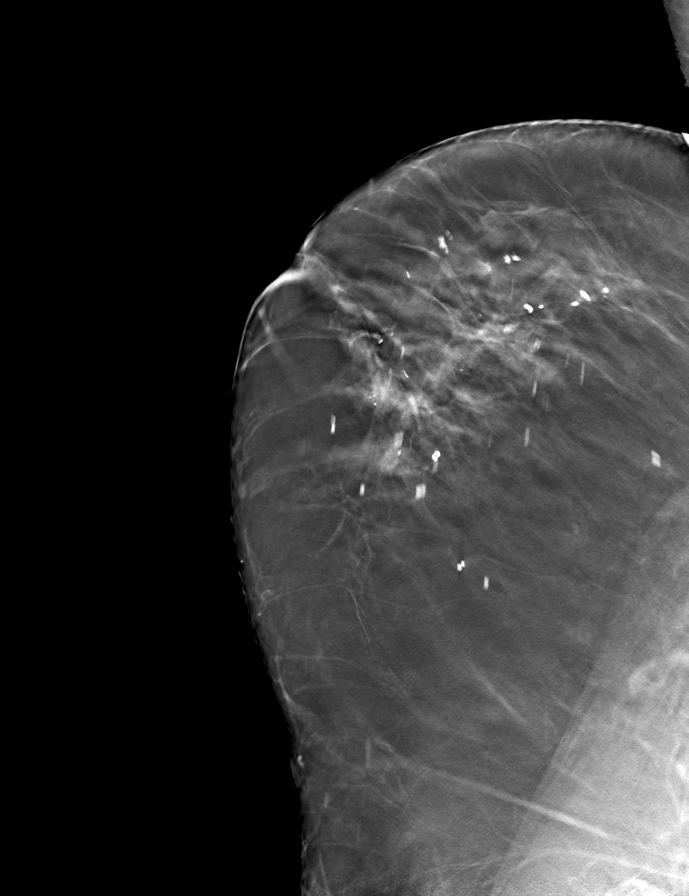

[9 of 28 positions shown; findings below may reference images not displayed]

ACR Breast Density Category b: There are scattered areas of
fibroglandular density.
FINDINGS: There are no findings suspicious for malignancy.

Right breast scarring is again identified.

Images were processed with CAD.
IMPRESSION: No mammographic evidence of malignancy. A result letter of this
screening mammogram will be mailed directly to the patient.

RECOMMENDATION:
Screening mammogram in one year. (Code:91-1-ZTL)

BI-RADS CATEGORY  2: Benign.

## 2016-11-24 ENCOUNTER — Ambulatory Visit: Payer: Medicare Other

## 2016-11-25 ENCOUNTER — Telehealth: Payer: Self-pay

## 2016-11-25 ENCOUNTER — Ambulatory Visit
Admission: RE | Admit: 2016-11-25 | Discharge: 2016-11-25 | Disposition: A | Discharge status: Home / Self Care | Payer: Medicare Other | Source: Ambulatory Visit | Attending: Physician Assistant | Admitting: Physician Assistant

## 2016-11-25 ENCOUNTER — Ambulatory Visit (INDEPENDENT_AMBULATORY_CARE_PROVIDER_SITE_OTHER): Payer: Medicare Other | Admitting: Physician Assistant

## 2016-11-25 ENCOUNTER — Encounter: Payer: Self-pay | Admitting: Physician Assistant

## 2016-11-25 VITALS — BP 124/70 | HR 68 | Temp 98.5°F | Resp 16 | Wt 167.0 lb

## 2016-11-25 DIAGNOSIS — M7989 Other specified soft tissue disorders: Secondary | ICD-10-CM | POA: Diagnosis not present

## 2016-11-25 DIAGNOSIS — M79662 Pain in left lower leg: Secondary | ICD-10-CM | POA: Diagnosis not present

## 2016-11-25 DIAGNOSIS — I8392 Asymptomatic varicose veins of left lower extremity: Secondary | ICD-10-CM | POA: Diagnosis not present

## 2016-11-25 DIAGNOSIS — I8002 Phlebitis and thrombophlebitis of superficial vessels of left lower extremity: Secondary | ICD-10-CM | POA: Insufficient documentation

## 2016-11-25 NOTE — Telephone Encounter (Signed)
Pt advised. Agrees with treatment plan. Refuses vascular referral for now. Renaldo Fiddler, CMA

## 2016-11-25 NOTE — Patient Instructions (Signed)
Phlebitis Phlebitis is soreness and puffiness (swelling) in a vein. Follow these instructions at home:  Only take medicine as told by your doctor.  Raise (elevate) the affected limb on a pillow as told by your doctor.  Keep a warm [place] on the affected vein as told by your doctor. Do not sleep with a heating pad.  Use special stockings or bandages around the area of the affected vein as told by your doctor. These will speed healing and keep the condition from coming back.  Talk to your doctor about all the medicines you take.  Get follow-up blood tests as told by your doctor.  If the phlebitis is in your legs: ? Avoid standing or resting for long periods. ? Keep your legs moving. Raise your legs when you sit or lie.  Do not smoke.  Follow-up with your doctor as told. Contact a doctor if:  You have strange bruises or bleeding.  Your puffiness or pain in the affected area is not getting better.  You are taking medicine to lessen puffiness (anti-inflammatory medicine), and you get belly pain.  You have a fever. Get help right away if:  The phlebitis gets worse and you have more pain, puffiness (swelling), or redness.  You have trouble breathing or have chest pain. This information is not intended to replace advice given to you by your health care provider. Make sure you discuss any questions you have with your health care provider. Document Released: 08/13/2009 Document Revised: 01/31/2016 Document Reviewed: 05/02/2013 Elsevier Interactive Patient Education  2017 Elsevier Inc.  

## 2016-11-25 NOTE — Progress Notes (Signed)
Patient: Renee Powell Female    DOB: [birth year]   83 y.o.   MRN: [id number] Visit Date: 11/25/2016  Today's Provider: Trinna Post, PA-C   Chief Complaint  Patient presents with  . Leg Swelling    Pain and redness.  Pain started about a month ago; redness in the last few days.    Subjective:    HPI  Pt is an 83 year old woman with hypothryoidism on Synthroid and history if breast cancer who comes in today complaining of left leg pain/burning, swelling.  She states the pain started about a month ago.  She says the pain is worse especially in the last few days.  (Now with "Burning"). She has also noticed redness as well.  She has not tired anything to treat it she states "the pain is not to that point yet".  She has no chest pain, SOB, nausea, vomiting, fevers, chills.    No Known Allergies   Current Outpatient Prescriptions:  .  SYNTHROID 100 MCG tablet, Take 100 mcg by mouth daily before breakfast., Disp: , Rfl:   Review of Systems  Constitutional: Negative.   Respiratory: Negative.   Cardiovascular: Positive for leg swelling. Negative for chest pain and palpitations.  Gastrointestinal: Negative.   Musculoskeletal: Positive for gait problem and myalgias. Negative for arthralgias, back pain, joint swelling, neck pain and neck stiffness.       Bilateral leg pain, swelling, redness.     Neurological: Negative for dizziness, tremors, weakness, light-headedness, numbness and headaches.    Social History  Substance Use Topics  . Smoking status: Never Smoker  . Smokeless tobacco: Never Used  . Alcohol use No   Objective:   BP 124/70 (BP Location: Right Arm, Patient Position: Sitting, Cuff Size: Large)   Pulse 68   Temp 98.5 F (36.9 C) (Oral)   Resp 16   Wt 167 lb (75.8 kg)   BMI 30.54 kg/m  Vitals:   11/25/16 0911  BP: 124/70  Pulse: 68  Resp: 16  Temp: 98.5 F (36.9 C)  TempSrc: Oral  Weight: 167 lb (75.8 kg)     Physical Exam  Constitutional:  She appears well-developed and well-nourished.  Cardiovascular: Normal rate, regular rhythm and normal heart sounds.   Pulmonary/Chest: Effort normal and breath sounds normal.  Skin: Skin is warm and dry. No rash noted. There is erythema.     Extensive varicose veins on bilateral lower extremities. Distal pulses in tact. On her left lower extremity, there is palpable nodularity in the popliteal fossa. There is visible erythema and slight edema. Area is tender to palpation. There is also another area of erythema on her anterior thigh, just superior to knee. No edema of lower extremity.         Assessment & Plan:     1. Left leg swelling  Do suspect superficial thrombophlebitis but with patient's age and history of malignancy, will get ultrasound to further evaluate for concmittant DVT.  Ultrasound revealed superficial thrombophlebitis. Will advise treatment with NSAIDs, warm compression, elevation when possible, and maintaining ambulation.  - US Venous Img Lower Unilateral Left; Future  2. Pain and swelling of left lower leg  - US Venous Img Lower Unilateral Left; Future  The entirety of the information documented in the History of Present Illness, Review of Systems and Physical Exam were personally obtained by me. Portions of this information were initially documented by Ashley Royalty, CMA and reviewed by me for thoroughness  and accuracy.   Return if symptoms worsen or fail to improve.        Trinna Post, PA-C  [facility]

## 2016-11-25 NOTE — Telephone Encounter (Signed)
-----   Message from Trinna Post, [place] sent at 11/25/2016 12:09 PM EDT ----- Ultrasound revealed superficial thrombophlebitis and NOT a DVT. Can treat this with NSAIDS, like 200 mg ibuprofen every 6 hours for one week, and warm compression therapy. If she wishes, can refer to vascular surgery to address varicose veins. Call back if not improving. Thanks.

## 2016-12-19 ENCOUNTER — Ambulatory Visit
Admission: RE | Admit: 2016-12-19 | Discharge: 2016-12-19 | Disposition: A | Discharge status: Home / Self Care | Payer: Medicare Other | Source: Ambulatory Visit | Attending: Oncology | Admitting: Oncology

## 2016-12-19 DIAGNOSIS — Z1231 Encounter for screening mammogram for malignant neoplasm of breast: Secondary | ICD-10-CM | POA: Diagnosis not present

## 2017-01-28 ENCOUNTER — Telehealth: Payer: Self-pay | Admitting: Family Medicine

## 2017-01-28 NOTE — Telephone Encounter (Signed)
Patient contacted to schedule AWV

## 2017-03-24 ENCOUNTER — Ambulatory Visit: Payer: Medicare Other | Admitting: Oncology

## 2017-03-29 NOTE — Progress Notes (Signed)
[place]  Telephone:(336) [phone] Fax:(336) [phone]  ID: Renee Powell OB: 1934/04/17  MR#: [id number]  CWC#:[id number]  Patient Care Team: Jerrol Banana., MD as PCP - General (Family Medicine) Jerrol Banana., MD (Family Medicine) Bary Castilla, Forest Gleason, MD (General Surgery)  CHIEF COMPLAINT: Stage IIa adenocarcinoma the right breast status post lumpectomy and XRT.  INTERVAL HISTORY: Patient returns to clinic today for routine yearly follow up and evaluation.  She continues to feel well and remains asymptomatic. She denies any neurologic complaints.  She has no fevers, chills, night sweats, or weight loss. She has a good appetite. She denies any chest pain, shortness of breath, or cough.  She denies any nausea, vomiting, constipation, or diarrhea.  She has no bony pain.  Patient feels at her baseline today and offers no specific complaints today.  REVIEW OF SYSTEMS:   Review of Systems  Constitutional: Negative.  Negative for fever, malaise/fatigue and weight loss.  Respiratory: Negative.  Negative for cough and shortness of breath.   Cardiovascular: Negative.  Negative for chest pain.  Gastrointestinal: Negative.  Negative for abdominal pain.  Genitourinary: Negative.   Musculoskeletal: Negative.   Neurological: Negative.  Negative for sensory change and weakness.  Psychiatric/Behavioral: Negative.     As per HPI. Otherwise, a complete review of systems is negatve.  PAST MEDICAL HISTORY: Past Medical History:  Diagnosis Date  . Breast cancer [facility]) 2002   right lumpectomy with radiation  . Cancer [facility]) 2002   breast    PAST SURGICAL HISTORY: Past Surgical History:  Procedure Laterality Date  . BREAST SURGERY Right 2002   lumpectomy    FAMILY HISTORY Family History  Problem Relation Age of Onset  . Cancer Father        lung cancer  . Cancer Maternal Uncle   . Cancer Paternal Aunt        Breast  . Cancer Paternal Uncle    Kidney  . Cancer Paternal Uncle        Lung  . Heart attack Mother   . Cancer Paternal Grandmother        Breast  . Breast cancer Maternal Aunt 60  . Breast cancer Maternal Grandmother 16       ADVANCED DIRECTIVES:    HEALTH MAINTENANCE: Social History  Substance Use Topics  . Smoking status: Never Smoker  . Smokeless tobacco: Never Used  . Alcohol use No     Colonoscopy:  PAP:  Bone density:  Lipid panel:  No Known Allergies  Current Outpatient Prescriptions  Medication Sig Dispense Refill  . SYNTHROID 75 MCG tablet Take 75 mcg by mouth daily.  11   No current facility-administered medications for this visit.     OBJECTIVE: Vitals:   03/31/17 0913  BP: 131/70  Pulse: 63  Resp: 18  Temp: (!) 96.3 F (35.7 C)     Body mass index is 30.12 kg/m.    ECOG FS:0 - Asymptomatic  General: Well-developed, well-nourished, no acute distress. Eyes: Pink conjunctiva, anicteric sclera. Breasts: Bilateral breast and axilla without lumps or masses. Lungs: Clear to auscultation bilaterally. Heart: Regular rate and rhythm. No rubs, murmurs, or gallops. Abdomen: Soft, nontender, nondistended. No organomegaly noted, normoactive bowel sounds. Musculoskeletal: No edema, cyanosis, or clubbing. Neuro: Alert, answering all questions appropriately. Cranial nerves grossly intact. Skin: No rashes or petechiae noted. Psych: Normal affect.   LAB RESULTS:  Lab Results  Component Value Date   NA 144 07/11/2015   K  4.0 07/11/2015   CL 103 07/11/2015   CO2 26 07/11/2015   GLUCOSE 83 07/11/2015   BUN 16 07/11/2015   CREATININE 0.85 07/11/2015   CALCIUM 9.4 07/11/2015   PROT 6.4 07/11/2015   ALBUMIN 4.3 07/11/2015   AST 10 07/11/2015   ALT 10 07/11/2015   ALKPHOS 81 07/11/2015   BILITOT 0.4 07/11/2015   GFRNONAA 65 07/11/2015   GFRAA 75 07/11/2015    Lab Results  Component Value Date   WBC 7.6 07/11/2015   NEUTROABS 4.5 07/11/2015   HGB 13.6 07/11/2015   HCT 40.9  07/11/2015   MCV 89 07/11/2015   PLT 235 07/11/2015     STUDIES: No results found.  ASSESSMENT: Stage IIa adenocarcinoma the right breast status post lumpectomy and XRT.  PLAN:    1. Stage IIa adenocarcinoma the right breast status post lumpectomy and XRT: No evidence of disease. Patient's most recent mammogram on December 19, 2016 was reported as BI-RADS 1. Repeat in April 2019 which can be ordered by her PCP. No further follow up is necessary.   2. Osteopenia: Previously, patient discontinued her Fosamax and does not wish to restart. Continue calcium and vitamin D. Patient refuses any further bone mineral density examinations.  Patient expressed understanding and was in agreement with this plan. She also understands that She can call clinic at any time with any questions, concerns, or complaints.   Breast cancer   Staging form: Breast, AJCC 7th Edition     Clinical stage from 04/07/2015: Stage IIA (T2, N0, M0) - Signed by Lloyd Huger, MD on 04/07/2015   Lloyd Huger, MD   03/31/2017 9:58 PM

## 2017-03-31 ENCOUNTER — Inpatient Hospital Stay: Payer: Medicare Other | Attending: Oncology | Admitting: Oncology

## 2017-03-31 VITALS — BP 131/70 | HR 63 | Temp 96.3°F | Resp 18 | Wt 164.7 lb

## 2017-03-31 DIAGNOSIS — M8580 Other specified disorders of bone density and structure, unspecified site: Secondary | ICD-10-CM | POA: Diagnosis not present

## 2017-03-31 DIAGNOSIS — C50911 Malignant neoplasm of unspecified site of right female breast: Secondary | ICD-10-CM

## 2017-03-31 DIAGNOSIS — Z923 Personal history of irradiation: Secondary | ICD-10-CM | POA: Insufficient documentation

## 2017-03-31 DIAGNOSIS — Z803 Family history of malignant neoplasm of breast: Secondary | ICD-10-CM | POA: Insufficient documentation

## 2017-03-31 DIAGNOSIS — Z8051 Family history of malignant neoplasm of kidney: Secondary | ICD-10-CM

## 2017-03-31 DIAGNOSIS — Z170 Estrogen receptor positive status [ER+]: Secondary | ICD-10-CM

## 2017-03-31 DIAGNOSIS — Z801 Family history of malignant neoplasm of trachea, bronchus and lung: Secondary | ICD-10-CM | POA: Diagnosis not present

## 2017-03-31 DIAGNOSIS — Z79899 Other long term (current) drug therapy: Secondary | ICD-10-CM | POA: Insufficient documentation

## 2017-03-31 DIAGNOSIS — Z853 Personal history of malignant neoplasm of breast: Secondary | ICD-10-CM | POA: Insufficient documentation

## 2017-03-31 NOTE — Progress Notes (Signed)
Patient denies any concerns today.  

## 2017-05-01 ENCOUNTER — Emergency Department: Payer: Medicare Other

## 2017-05-01 ENCOUNTER — Encounter: Payer: Self-pay | Admitting: Emergency Medicine

## 2017-05-01 ENCOUNTER — Emergency Department
Admission: EM | Admit: 2017-05-01 | Discharge: 2017-05-01 | Disposition: A | Discharge status: Home / Self Care | Payer: Medicare Other | Attending: Emergency Medicine | Admitting: Emergency Medicine

## 2017-05-01 DIAGNOSIS — Z853 Personal history of malignant neoplasm of breast: Secondary | ICD-10-CM | POA: Diagnosis not present

## 2017-05-01 DIAGNOSIS — R197 Diarrhea, unspecified: Secondary | ICD-10-CM | POA: Diagnosis present

## 2017-05-01 DIAGNOSIS — Z79899 Other long term (current) drug therapy: Secondary | ICD-10-CM | POA: Diagnosis not present

## 2017-05-01 LAB — URINALYSIS, COMPLETE (UACMP) WITH MICROSCOPIC
Bacteria, UA: NONE SEEN
Bilirubin Urine: NEGATIVE
GLUCOSE, UA: NEGATIVE mg/dL
Hgb urine dipstick: NEGATIVE
KETONES UR: 5 mg/dL — AB
Leukocytes, UA: NEGATIVE
Nitrite: NEGATIVE
PH: 5 (ref 5.0–8.0)
Protein, ur: NEGATIVE mg/dL
RBC / HPF: NONE SEEN RBC/hpf (ref 0–5)
SPECIFIC GRAVITY, URINE: 1.026 (ref 1.005–1.030)

## 2017-05-01 LAB — COMPREHENSIVE METABOLIC PANEL
ALK PHOS: 73 U/L (ref 38–126)
ALT: 13 U/L — AB (ref 14–54)
AST: 19 U/L (ref 15–41)
Albumin: 4.4 g/dL (ref 3.5–5.0)
Anion gap: 8 (ref 5–15)
BUN: 23 mg/dL — ABNORMAL HIGH (ref 6–20)
CALCIUM: 9.2 mg/dL (ref 8.9–10.3)
CO2: 26 mmol/L (ref 22–32)
CREATININE: 1.08 mg/dL — AB (ref 0.44–1.00)
Chloride: 104 mmol/L (ref 101–111)
GFR, EST AFRICAN AMERICAN: 54 mL/min — AB (ref >60)
GFR, EST NON AFRICAN AMERICAN: 47 mL/min — AB (ref >60)
Glucose, Bld: 97 mg/dL (ref 65–99)
Potassium: 3.6 mmol/L (ref 3.5–5.1)
Sodium: 138 mmol/L (ref 135–145)
Total Bilirubin: 1.6 mg/dL — ABNORMAL HIGH (ref 0.3–1.2)
Total Protein: 7.4 g/dL (ref 6.5–8.1)

## 2017-05-01 LAB — GASTROINTESTINAL PANEL BY PCR, STOOL (REPLACES STOOL CULTURE)

## 2017-05-01 LAB — CBC
HCT: 39.8 % (ref 35.0–47.0)
Hemoglobin: 13.3 g/dL (ref 12.0–16.0)
MCH: 29.2 pg (ref 26.0–34.0)
MCHC: 33.4 g/dL (ref 32.0–36.0)
MCV: 87.2 fL (ref 80.0–100.0)
PLATELETS: 216 10*3/uL (ref 150–440)
RBC: 4.56 MIL/uL (ref 3.80–5.20)
RDW: 13.8 % (ref 11.5–14.5)
WBC: 10.7 10*3/uL (ref 3.6–11.0)

## 2017-05-01 LAB — LIPASE, BLOOD: Lipase: 19 U/L (ref 11–51)

## 2017-05-01 LAB — C DIFFICILE QUICK SCREEN W PCR REFLEX
C DIFFICILE (CDIFF) INTERP: NOT DETECTED
C DIFFICLE (CDIFF) ANTIGEN: NEGATIVE
C Diff toxin: NEGATIVE

## 2017-05-01 MED ORDER — DICYCLOMINE HCL 10 MG PO CAPS
10.0000 mg | ORAL_CAPSULE | Freq: Once | ORAL | Status: AC
  Administered 2017-05-01: 10 mg via ORAL
  Filled 2017-05-01: qty 1

## 2017-05-01 MED ORDER — LOPERAMIDE HCL 2 MG PO CAPS
4.0000 mg | ORAL_CAPSULE | Freq: Once | ORAL | Status: AC
  Administered 2017-05-01: 4 mg via ORAL
  Filled 2017-05-01: qty 2

## 2017-05-01 NOTE — ED Notes (Signed)
Patient reports an episode of diarrhea where she was incontinent of stool.  Patient given wipes and was able to clean herself and put on new pants.

## 2017-05-01 NOTE — ED Triage Notes (Signed)
C/O diarrhea x 4 days.

## 2017-05-01 NOTE — ED Notes (Signed)
Patient transported to X-ray 

## 2017-05-01 NOTE — ED Notes (Signed)
Pt provided cup of water upon request for fluid challenge.

## 2017-05-01 NOTE — ED Notes (Signed)
Pt with episode of bowel incontinence.  Pt cleaned up per this RN and ED tech.  Full linen change and brief placed on pt at this time.

## 2017-05-01 NOTE — ED Provider Notes (Signed)
[facility] Emergency Department Provider Note  ____________________________________________  Time seen: Approximately 10:21 PM  I have reviewed the triage vital signs and the nursing notes.   HISTORY  Chief Complaint Diarrhea    HPI Renee Powell is a 83 y.o. female who complains of diarrhea for the past 4 days. Denies nausea vomiting or abdominal pain. No urinary symptoms. No issues with chronic diarrhea. No sick contacts that she knows of. No abnormal food or other ingestion. Stool was not black or bloody just watery and brown. When she eats it provokes a bowel movement quickly. Better with Imodium, but then it comes back again several hours later.     Past Medical History:  Diagnosis Date  . Breast cancer [facility]) 2002   right lumpectomy with radiation  . Cancer [facility]) 2002   breast     Patient Active Problem List   Diagnosis Date Noted  . HLD (hyperlipidemia) 07/04/2015  . Dermoid inclusion cyst 07/04/2015  . Disease of thyroid gland 07/04/2015  . Cancer of right female breast ([place]) 04/07/2015  . Perianal cyst 12/13/2014     Past Surgical History:  Procedure Laterality Date  . BREAST SURGERY Right 2002   lumpectomy     Prior to Admission medications   Medication Sig Start Date End Date Taking? Authorizing Provider  SYNTHROID 75 MCG tablet Take 75 mcg by mouth daily. 01/24/17   [provider]     Allergies Patient has no known allergies.   Family History  Problem Relation Age of Onset  . Cancer Father        lung cancer  . Cancer Maternal Uncle   . Cancer Paternal Aunt        Breast  . Cancer Paternal Uncle        Kidney  . Cancer Paternal Uncle        Lung  . Heart attack Mother   . Cancer Paternal Grandmother        Breast  . Breast cancer Maternal Aunt 60  . Breast cancer Maternal Grandmother 60    Social History Social History  Substance Use Topics  . Smoking status: Never Smoker  . Smokeless  tobacco: Never Used  . Alcohol use No    Review of Systems  Constitutional:   No fever or chills.  ENT:   No sore throat. No rhinorrhea. Cardiovascular:   No chest pain or syncope. Respiratory:   No dyspnea or cough. Gastrointestinal:   Negative for abdominal pain Or vomiting. Positive as above diarrhea Musculoskeletal:   Negative for focal pain or swelling All other systems reviewed and are negative except as documented above in ROS and HPI.  ____________________________________________   PHYSICAL EXAM:  VITAL SIGNS: ED Triage Vitals  Enc Vitals Group     BP 05/01/17 1459 (!) 122/97     Pulse Rate 05/01/17 1459 90     Resp 05/01/17 1459 16     Temp 05/01/17 1459 97.9 F (36.6 C)     Temp Source 05/01/17 1459 Oral     SpO2 05/01/17 1459 98 %     Weight 05/01/17 1457 158 lb (71.7 kg)     Height 05/01/17 1457 5\' 5"  (1.651 m)     Head Circumference --      Peak Flow --      Pain Score 05/01/17 1927 3     Pain Loc --      Pain Edu? --      Excl. in  [place]? --     Vital signs reviewed, nursing assessments reviewed.   Constitutional:   Alert and oriented. Well appearing and in no distress. Eyes:   No scleral icterus.  EOMI. No nystagmus. No conjunctival pallor. PERRL. ENT   Head:   Normocephalic and atraumatic.   Nose:   No congestion/rhinnorhea.    Mouth/Throat:   MMM, no pharyngeal erythema. No peritonsillar mass.    Neck:   No meningismus. Full ROM Hematological/Lymphatic/Immunilogical:   No cervical lymphadenopathy. Cardiovascular:   RRR. Symmetric bilateral radial and DP pulses.  No murmurs.  Respiratory:   Normal respiratory effort without tachypnea/retractions. Breath sounds are clear and equal bilaterally. No wheezes/rales/rhonchi. Gastrointestinal:   Soft and nontender. Non distended. There is no CVA tenderness.  No rebound, rigidity, or guarding. Genitourinary:   deferred Musculoskeletal:   Normal range of motion in all extremities. No joint  effusions.  No lower extremity tenderness.  No edema. Neurologic:   Normal speech and language.  Motor grossly intact. No gross focal neurologic deficits are appreciated.  Skin:    Skin is warm, dry and intact. No rash noted.  No petechiae, purpura, or bullae.  ____________________________________________    LABS (pertinent positives/negatives) (all labs ordered are listed, but only abnormal results are displayed) Labs Reviewed  COMPREHENSIVE METABOLIC PANEL - Abnormal; Notable for the following:       Result Value   BUN 23 (*)    Creatinine, Ser 1.08 (*)    ALT 13 (*)    Total Bilirubin 1.6 (*)    GFR calc non Af Amer 47 (*)    GFR calc Af Amer 54 (*)    All other components within normal limits  URINALYSIS, COMPLETE (UACMP) WITH MICROSCOPIC - Abnormal; Notable for the following:    Color, Urine AMBER (*)    APPearance HAZY (*)    Ketones, ur 5 (*)    Squamous Epithelial / LPF 6-30 (*)    All other components within normal limits  GASTROINTESTINAL PANEL BY PCR, STOOL (REPLACES STOOL CULTURE)  C DIFFICILE QUICK SCREEN W PCR REFLEX  LIPASE, BLOOD  CBC   ____________________________________________   EKG    ____________________________________________    RADIOLOGY  Dg Abdomen Acute W/chest  Result Date: 05/01/2017 CLINICAL DATA:  Abdominal distention, diarrhea EXAM: DG ABDOMEN ACUTE W/ 1V CHEST COMPARISON:  None. FINDINGS: Lungs are clear.  No pleural effusion or pneumothorax. The heart is normal in size. Small hiatal hernia. Nonobstructive bowel gas pattern. No evidence of free air under the diaphragm on the upright view. Visualized osseous structures are within normal limits. IMPRESSION: No evidence of acute cardiopulmonary disease. No evidence of small bowel obstruction or free air. Small hiatal hernia. Electronically Signed   By: Julian Hy M.D.   On: 05/01/2017 20:20     ____________________________________________   PROCEDURES Procedures  ____________________________________________   INITIAL IMPRESSION / ASSESSMENT AND PLAN / ED COURSE  Pertinent labs & imaging results that were available during my care of the patient were reviewed by me and considered in my medical decision making (see chart for details).  Patient well appearing no acute distress, normal vital signs, calm and comfortable, presents with isolated diarrhea. C. difficile and GI pathogen panel all negative. Labs negative. X-rays negative. Patient tolerating oral intake.Considering the patient's symptoms, medical history, and physical examination today, I have low suspicion for cholecystitis or biliary pathology, pancreatitis, perforation or bowel obstruction, hernia, intra-abdominal abscess, AAA or dissection, volvulus or intussusception, mesenteric ischemia, or appendicitis.  Advanced imaging not warranted at this time. Continue loperamide as needed, follow up with primary care.     ____________________________________________   FINAL CLINICAL IMPRESSION(S) / ED DIAGNOSES  Final diagnoses:  Diarrhea, unspecified type      New Prescriptions   No medications on file     Portions of this note were generated with dragon dictation software. Dictation errors may occur despite best attempts at proofreading.    Carrie Mew, MD 05/01/17 2226

## 2017-05-01 NOTE — Discharge Instructions (Signed)
Results for orders placed or performed during the hospital encounter of 05/01/17  Gastrointestinal Panel by PCR , Stool  Result Value Ref Range   Campylobacter species NOT DETECTED NOT DETECTED   Plesimonas shigelloides NOT DETECTED NOT DETECTED   Salmonella species NOT DETECTED NOT DETECTED   Yersinia enterocolitica NOT DETECTED NOT DETECTED   Vibrio species NOT DETECTED NOT DETECTED   Vibrio cholerae NOT DETECTED NOT DETECTED   Enteroaggregative E coli (EAEC) NOT DETECTED NOT DETECTED   Enteropathogenic E coli (EPEC) NOT DETECTED NOT DETECTED   Enterotoxigenic E coli (ETEC) NOT DETECTED NOT DETECTED   Shiga like toxin producing E coli (STEC) NOT DETECTED NOT DETECTED   Shigella/Enteroinvasive E coli (EIEC) NOT DETECTED NOT DETECTED   Cryptosporidium NOT DETECTED NOT DETECTED   Cyclospora cayetanensis NOT DETECTED NOT DETECTED   Entamoeba histolytica NOT DETECTED NOT DETECTED   Giardia lamblia NOT DETECTED NOT DETECTED   Adenovirus F40/41 NOT DETECTED NOT DETECTED   Astrovirus NOT DETECTED NOT DETECTED   Norovirus GI/GII NOT DETECTED NOT DETECTED   Rotavirus A NOT DETECTED NOT DETECTED   Sapovirus (I, II, IV, and V) NOT DETECTED NOT DETECTED  C difficile quick scan w PCR reflex  Result Value Ref Range   C Diff antigen NEGATIVE NEGATIVE   C Diff toxin NEGATIVE NEGATIVE   C Diff interpretation No C. difficile detected.   Lipase, blood  Result Value Ref Range   Lipase 19 11 - 51 U/L  Comprehensive metabolic panel  Result Value Ref Range   Sodium 138 135 - 145 mmol/L   Potassium 3.6 3.5 - 5.1 mmol/L   Chloride 104 101 - 111 mmol/L   CO2 26 22 - 32 mmol/L   Glucose, Bld 97 65 - 99 mg/dL   BUN 23 (H) 6 - 20 mg/dL   Creatinine, Ser 1.08 (H) 0.44 - 1.00 mg/dL   Calcium 9.2 8.9 - 10.3 mg/dL   Total Protein 7.4 6.5 - 8.1 g/dL   Albumin 4.4 3.5 - 5.0 g/dL   AST 19 15 - 41 U/L   ALT 13 (L) 14 - 54 U/L   Alkaline Phosphatase 73 38 - 126 U/L   Total Bilirubin 1.6 (H) 0.3 - 1.2 mg/dL     GFR calc non Af Amer 47 (L) >60 mL/min   GFR calc Af Amer 54 (L) >60 mL/min   Anion gap 8 5 - 15  CBC  Result Value Ref Range   WBC 10.7 3.6 - 11.0 K/uL   RBC 4.56 3.80 - 5.20 MIL/uL   Hemoglobin 13.3 12.0 - 16.0 g/dL   HCT 39.8 35.0 - 47.0 %   MCV 87.2 80.0 - 100.0 fL   MCH 29.2 26.0 - 34.0 pg   MCHC 33.4 32.0 - 36.0 g/dL   RDW 13.8 11.5 - 14.5 %   Platelets 216 150 - 440 K/uL  Urinalysis, Complete w Microscopic  Result Value Ref Range   Color, Urine AMBER (A) YELLOW   APPearance HAZY (A) CLEAR   Specific Gravity, Urine 1.026 1.005 - 1.030   pH 5.0 5.0 - 8.0   Glucose, UA NEGATIVE NEGATIVE mg/dL   Hgb urine dipstick NEGATIVE NEGATIVE   Bilirubin Urine NEGATIVE NEGATIVE   Ketones, ur 5 (A) NEGATIVE mg/dL   Protein, ur NEGATIVE NEGATIVE mg/dL   Nitrite NEGATIVE NEGATIVE   Leukocytes, UA NEGATIVE NEGATIVE   RBC / HPF NONE SEEN 0 - 5 RBC/hpf   WBC, UA 0-5 0 - 5 WBC/hpf   Bacteria,  UA NONE SEEN NONE SEEN   Squamous Epithelial / LPF 6-30 (A) NONE SEEN   Mucus PRESENT    Dg Abdomen Acute W/chest  Result Date: 05/01/2017 CLINICAL DATA:  Abdominal distention, diarrhea EXAM: DG ABDOMEN ACUTE W/ 1V CHEST COMPARISON:  None. FINDINGS: Lungs are clear.  No pleural effusion or pneumothorax. The heart is normal in size. Small hiatal hernia. Nonobstructive bowel gas pattern. No evidence of free air under the diaphragm on the upright view. Visualized osseous structures are within normal limits. IMPRESSION: No evidence of acute cardiopulmonary disease. No evidence of small bowel obstruction or free air. Small hiatal hernia. Electronically Signed   By: Julian Hy M.D.   On: 05/01/2017 20:20

## 2017-05-03 ENCOUNTER — Emergency Department: Payer: Medicare Other

## 2017-05-03 ENCOUNTER — Observation Stay
Admission: EM | Admit: 2017-05-03 | Discharge: 2017-05-07 | Disposition: A | Discharge status: Home / Self Care | Payer: Medicare Other | Attending: Internal Medicine | Admitting: Internal Medicine

## 2017-05-03 ENCOUNTER — Encounter: Payer: Self-pay | Admitting: Emergency Medicine

## 2017-05-03 DIAGNOSIS — Z8249 Family history of ischemic heart disease and other diseases of the circulatory system: Secondary | ICD-10-CM | POA: Insufficient documentation

## 2017-05-03 DIAGNOSIS — K5750 Diverticulosis of both small and large intestine without perforation or abscess without bleeding: Secondary | ICD-10-CM | POA: Insufficient documentation

## 2017-05-03 DIAGNOSIS — E876 Hypokalemia: Secondary | ICD-10-CM | POA: Diagnosis not present

## 2017-05-03 DIAGNOSIS — Z8051 Family history of malignant neoplasm of kidney: Secondary | ICD-10-CM | POA: Diagnosis not present

## 2017-05-03 DIAGNOSIS — E039 Hypothyroidism, unspecified: Secondary | ICD-10-CM | POA: Diagnosis not present

## 2017-05-03 DIAGNOSIS — Z853 Personal history of malignant neoplasm of breast: Secondary | ICD-10-CM | POA: Insufficient documentation

## 2017-05-03 DIAGNOSIS — Z923 Personal history of irradiation: Secondary | ICD-10-CM | POA: Insufficient documentation

## 2017-05-03 DIAGNOSIS — K6389 Other specified diseases of intestine: Secondary | ICD-10-CM | POA: Insufficient documentation

## 2017-05-03 DIAGNOSIS — K529 Noninfective gastroenteritis and colitis, unspecified: Secondary | ICD-10-CM | POA: Diagnosis not present

## 2017-05-03 DIAGNOSIS — K8020 Calculus of gallbladder without cholecystitis without obstruction: Secondary | ICD-10-CM | POA: Diagnosis not present

## 2017-05-03 DIAGNOSIS — Z809 Family history of malignant neoplasm, unspecified: Secondary | ICD-10-CM | POA: Diagnosis not present

## 2017-05-03 DIAGNOSIS — I700 Atherosclerosis of aorta: Secondary | ICD-10-CM | POA: Diagnosis not present

## 2017-05-03 DIAGNOSIS — Z803 Family history of malignant neoplasm of breast: Secondary | ICD-10-CM | POA: Insufficient documentation

## 2017-05-03 DIAGNOSIS — K449 Diaphragmatic hernia without obstruction or gangrene: Secondary | ICD-10-CM | POA: Diagnosis not present

## 2017-05-03 DIAGNOSIS — D125 Benign neoplasm of sigmoid colon: Secondary | ICD-10-CM | POA: Diagnosis not present

## 2017-05-03 DIAGNOSIS — Z801 Family history of malignant neoplasm of trachea, bronchus and lung: Secondary | ICD-10-CM | POA: Diagnosis not present

## 2017-05-03 DIAGNOSIS — R197 Diarrhea, unspecified: Secondary | ICD-10-CM

## 2017-05-03 DIAGNOSIS — E785 Hyperlipidemia, unspecified: Secondary | ICD-10-CM | POA: Insufficient documentation

## 2017-05-03 DIAGNOSIS — Z79899 Other long term (current) drug therapy: Secondary | ICD-10-CM | POA: Insufficient documentation

## 2017-05-03 DIAGNOSIS — E860 Dehydration: Secondary | ICD-10-CM | POA: Insufficient documentation

## 2017-05-03 LAB — COMPREHENSIVE METABOLIC PANEL
ALT: 15 U/L (ref 14–54)
AST: 22 U/L (ref 15–41)
Albumin: 3.8 g/dL (ref 3.5–5.0)
Alkaline Phosphatase: 75 U/L (ref 38–126)
Anion gap: 10 (ref 5–15)
BILIRUBIN TOTAL: 1.4 mg/dL — AB (ref 0.3–1.2)
BUN: 22 mg/dL — AB (ref 6–20)
CO2: 22 mmol/L (ref 22–32)
Calcium: 9 mg/dL (ref 8.9–10.3)
Chloride: 104 mmol/L (ref 101–111)
Creatinine, Ser: 1.12 mg/dL — ABNORMAL HIGH (ref 0.44–1.00)
GFR calc Af Amer: 52 mL/min — ABNORMAL LOW (ref >60)
GFR calc non Af Amer: 45 mL/min — ABNORMAL LOW (ref >60)
Glucose, Bld: 115 mg/dL — ABNORMAL HIGH (ref 65–99)
POTASSIUM: 3.2 mmol/L — AB (ref 3.5–5.1)
Sodium: 136 mmol/L (ref 135–145)
Total Protein: 7 g/dL (ref 6.5–8.1)

## 2017-05-03 LAB — CBC WITH DIFFERENTIAL/PLATELET
BASOS ABS: 0.1 10*3/uL (ref 0–0.1)
BASOS PCT: 1 %
Eosinophils Absolute: 0.2 10*3/uL (ref 0–0.7)
Eosinophils Relative: 3 %
HEMATOCRIT: 37.1 % (ref 35.0–47.0)
HEMOGLOBIN: 12.7 g/dL (ref 12.0–16.0)
Lymphocytes Relative: 12 %
Lymphs Abs: 1 10*3/uL (ref 1.0–3.6)
MCH: 29.4 pg (ref 26.0–34.0)
MCHC: 34.1 g/dL (ref 32.0–36.0)
MCV: 86.1 fL (ref 80.0–100.0)
MONOS PCT: 14 %
Monocytes Absolute: 1.1 10*3/uL — ABNORMAL HIGH (ref 0.2–0.9)
NEUTROS ABS: 5.5 10*3/uL (ref 1.4–6.5)
Neutrophils Relative %: 70 %
Platelets: 201 10*3/uL (ref 150–440)
RBC: 4.31 MIL/uL (ref 3.80–5.20)
RDW: 13.8 % (ref 11.5–14.5)
WBC: 7.7 10*3/uL (ref 3.6–11.0)

## 2017-05-03 LAB — LIPASE, BLOOD: LIPASE: 21 U/L (ref 11–51)

## 2017-05-03 LAB — URINALYSIS, COMPLETE (UACMP) WITH MICROSCOPIC
Bilirubin Urine: NEGATIVE
GLUCOSE, UA: NEGATIVE mg/dL
HGB URINE DIPSTICK: NEGATIVE
KETONES UR: 20 mg/dL — AB
LEUKOCYTES UA: NEGATIVE
NITRITE: NEGATIVE
Protein, ur: NEGATIVE mg/dL
Specific Gravity, Urine: 1.024 (ref 1.005–1.030)
pH: 5 (ref 5.0–8.0)

## 2017-05-03 LAB — GASTROINTESTINAL PANEL BY PCR, STOOL (REPLACES STOOL CULTURE)

## 2017-05-03 LAB — C DIFFICILE QUICK SCREEN W PCR REFLEX
C DIFFICILE (CDIFF) TOXIN: NEGATIVE
C DIFFICLE (CDIFF) ANTIGEN: NEGATIVE
C Diff interpretation: NOT DETECTED

## 2017-05-03 MED ORDER — ACETAMINOPHEN 325 MG PO TABS: 650.0000 mg | ORAL_TABLET | Freq: Four times a day (QID) | ORAL | Status: DC | PRN

## 2017-05-03 MED ORDER — ENOXAPARIN SODIUM 30 MG/0.3ML ~~LOC~~ SOLN: 30.0000 mg | SUBCUTANEOUS | Status: DC

## 2017-05-03 MED ORDER — ENOXAPARIN SODIUM 40 MG/0.4ML ~~LOC~~ SOLN
40.0000 mg | SUBCUTANEOUS | Status: DC
  Administered 2017-05-03 – 2017-05-06 (×3): 40 mg via SUBCUTANEOUS
  Filled 2017-05-03 (×2): qty 0.4

## 2017-05-03 MED ORDER — ONDANSETRON HCL 4 MG/2ML IJ SOLN: 4.0000 mg | Freq: Four times a day (QID) | INTRAMUSCULAR | Status: DC | PRN

## 2017-05-03 MED ORDER — IOPAMIDOL (ISOVUE-300) INJECTION 61%
80.0000 mL | Freq: Once | INTRAVENOUS | Status: AC | PRN
  Administered 2017-05-03: 80 mL via INTRAVENOUS

## 2017-05-03 MED ORDER — ACETAMINOPHEN 650 MG RE SUPP: 650.0000 mg | Freq: Four times a day (QID) | RECTAL | Status: DC | PRN

## 2017-05-03 MED ORDER — TRAMADOL HCL 50 MG PO TABS: 50.0000 mg | ORAL_TABLET | Freq: Four times a day (QID) | ORAL | Status: DC | PRN

## 2017-05-03 MED ORDER — IOPAMIDOL (ISOVUE-300) INJECTION 61%
30.0000 mL | Freq: Once | INTRAVENOUS | Status: AC | PRN
  Administered 2017-05-03: 30 mL via ORAL

## 2017-05-03 MED ORDER — ONDANSETRON HCL 4 MG PO TABS: 4.0000 mg | ORAL_TABLET | Freq: Four times a day (QID) | ORAL | Status: DC | PRN

## 2017-05-03 MED ORDER — LOPERAMIDE HCL 2 MG PO CAPS: 2.0000 mg | ORAL_CAPSULE | Freq: Four times a day (QID) | ORAL | Status: DC | PRN

## 2017-05-03 MED ORDER — LEVOTHYROXINE SODIUM 50 MCG PO TABS
75.0000 ug | ORAL_TABLET | Freq: Every day | ORAL | Status: DC
  Administered 2017-05-04 – 2017-05-07 (×3): 75 ug via ORAL
  Filled 2017-05-03 (×3): qty 1

## 2017-05-03 MED ORDER — POTASSIUM CHLORIDE IN NACL 20-0.9 MEQ/L-% IV SOLN
INTRAVENOUS | Status: DC
  Administered 2017-05-03 – 2017-05-05 (×3): via INTRAVENOUS
  Filled 2017-05-03 (×6): qty 1000

## 2017-05-03 NOTE — ED Notes (Signed)
Lab called to inquire about GI panel results; lab tech stated that testing should be complete in 15-20 minutes.

## 2017-05-03 NOTE — ED Triage Notes (Addendum)
Pt was seen here yesterday with c/o diarrhea; pt was treated and released; pt says diarrhea continues and has had multiple watery stools since midnight; c/o generalized abd pain; vomited once in the middle of the night; pt reports some weakness; ambulatory with steady gait; talking in complete coherent sentences; taking Imodium as directed with no relief of symptoms

## 2017-05-03 NOTE — ED Provider Notes (Addendum)
[facility] Emergency Department Provider Note   ____________________________________________   First MD Initiated Contact with Patient 05/03/17 [phone]     (approximate)  I have reviewed the triage vital signs and the nursing notes.   HISTORY  Chief Complaint Abdominal Pain and Diarrhea    HPI Renee Powell is a 83 y.o. female patient reports she developed diarrhea last Wednesday. She was seen on Friday given Imodium but is now just as bad as she was initially. She is having stools every time she tries to eat or drink anything.  Liquidy and runny and dark. She's having pain all around her abdomen. It is running to her back. Pain is moderate and achy and crampy she vomited once last night. She doesn't believe she is running a fever. She has no other complaints. The diarrhea itself is quite severe she went at least 7 times last night.   Past Medical History:  Diagnosis Date  . Breast cancer [facility]) 2002   right lumpectomy with radiation  . Cancer [facility]) 2002   breast    Patient Active Problem List   Diagnosis Date Noted  . HLD (hyperlipidemia) 07/04/2015  . Dermoid inclusion cyst 07/04/2015  . Disease of thyroid gland 07/04/2015  . Cancer of right female breast ([place]) 04/07/2015  . Perianal cyst 12/13/2014    Past Surgical History:  Procedure Laterality Date  . BREAST SURGERY Right 2002   lumpectomy    Prior to Admission medications   Medication Sig Start Date End Date Taking? Authorizing Provider  SYNTHROID 75 MCG tablet Take 75 mcg by mouth daily. 01/24/17   [provider]    Allergies Patient has no known allergies.  Family History  Problem Relation Age of Onset  . Cancer Father        lung cancer  . Cancer Maternal Uncle   . Cancer Paternal Aunt        Breast  . Cancer Paternal Uncle        Kidney  . Cancer Paternal Uncle        Lung  . Heart attack Mother   . Cancer Paternal Grandmother        Breast  . Breast  cancer Maternal Aunt 60  . Breast cancer Maternal Grandmother 60    Social History Social History  Substance Use Topics  . Smoking status: Never Smoker  . Smokeless tobacco: Never Used  . Alcohol use No    Review of Systems  Constitutional: No fever/chills Eyes: No visual changes. ENT: No sore throat. Cardiovascular: Denies chest pain. Respiratory: Denies shortness of breath. Gastrointestinal: See history of present illness Genitourinary: Negative for dysuria. Musculoskeletal: Negative for back pain. Skin: Negative for rash. Neurological: Negative for headaches, focal weakness   ____________________________________________   PHYSICAL EXAM:  VITAL SIGNS: ED Triage Vitals  Enc Vitals Group     BP 05/03/17 0631 129/70     Pulse Rate 05/03/17 0631 81     Resp 05/03/17 0631 18     Temp 05/03/17 0631 98.2 F (36.8 C)     Temp Source 05/03/17 0631 Oral     SpO2 05/03/17 0631 97 %     Weight 05/03/17 0633 158 lb (71.7 kg)     Height 05/03/17 0633 5\' 5"  (1.651 m)     Head Circumference --      Peak Flow --      Pain Score 05/03/17 0631 3     Pain Loc --  Pain Edu? --      Excl. in [place]? --     Constitutional: Alert and oriented. Well appearing and in no acute distress. Eyes: Conjunctivae are normal.  Head: Atraumatic. Nose: No congestion/rhinnorhea. Mouth/Throat: Mucous membranes are moist.  Oropharynx non-erythematous. Neck: No stridor.  Cardiovascular: Normal rate, regular rhythm. Grossly normal heart sounds.  Good peripheral circulation. Respiratory: Normal respiratory effort.  No retractions. Lungs CTAB. Gastrointestinal: Soft Mildly diffusely tender hyperactive bowel sounds. Mild distention. No abdominal bruits. No CVA tenderness. Rectal: Stool was watery greenish Hemoccult-negative  Musculoskeletal: No lower extremity tenderness nor edema.  No joint effusions. Neurologic:  Normal speech and language. No gross focal neurologic deficits are  appreciated. Skin:  Skin is warm, dry and intact. No rash noted. Psychiatric: Mood and affect are normal. Speech and behavior are normal.  ____________________________________________   LABS (all labs ordered are listed, but only abnormal results are displayed)  Labs Reviewed  COMPREHENSIVE METABOLIC PANEL - Abnormal; Notable for the following:       Result Value   Potassium 3.2 (*)    Glucose, Bld 115 (*)    BUN 22 (*)    Creatinine, Ser 1.12 (*)    Total Bilirubin 1.4 (*)    GFR calc non Af Amer 45 (*)    GFR calc Af Amer 52 (*)    All other components within normal limits  CBC WITH DIFFERENTIAL/PLATELET - Abnormal; Notable for the following:    Monocytes Absolute 1.1 (*)    All other components within normal limits  URINALYSIS, COMPLETE (UACMP) WITH MICROSCOPIC - Abnormal; Notable for the following:    Color, Urine AMBER (*)    APPearance HAZY (*)    Ketones, ur 20 (*)    Bacteria, UA RARE (*)    Squamous Epithelial / LPF 6-30 (*)    All other components within normal limits  GASTROINTESTINAL PANEL BY PCR, STOOL (REPLACES STOOL CULTURE)  C DIFFICILE QUICK SCREEN W PCR REFLEX  LIPASE, BLOOD   ____________________________________________  EKG   ____________________________________________  RADIOLOGY  IMPRESSION: 1. No acute findings in the abdomen or pelvis. No small or large bowel wall thickening in this patient with a reported history of diarrhea. 2. 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 No features on today's study to suggest diverticulitis. 3.  Aortic Atherosclerois (ICD10-170.0) 4. Cholelithiasis.   Electronically Signed   By: Misty Stanley M.D.   On: 05/03/2017 09:32 ____________________________________________   PROCEDURES  Procedure(s) performed:   Procedures  Critical Care performed:   ____________________________________________   INITIAL IMPRESSION / ASSESSMENT AND PLAN / ED  COURSE  Pertinent labs & imaging results that were available during my care of the patient were reviewed by me and considered in my medical decision making (see chart for details).  Discussed patient with Dr. Michail Sermon GI on-call he recommends the patient in the hospital for IV fluids and they will see her tomorrow     ____________________________________________   FINAL CLINICAL IMPRESSION(S) / ED DIAGNOSES  Final diagnoses:  Diarrhea, unspecified type      NEW MEDICATIONS STARTED DURING THIS VISIT:  New Prescriptions   No medications on file     Note:  This document was prepared using Dragon voice recognition software and may include unintentional dictation errors.      Nena Polio, MD 05/03/17 1151    Nena Polio, MD 05/03/17 [phone]

## 2017-05-03 NOTE — H&P (Signed)
[facility] NAME: Renee Powell    MR#:  [id number]  DATE OF BIRTH:  Feb 05, 1934  DATE OF ADMISSION:  05/03/2017  PRIMARY CARE PHYSICIAN: Jerrol Banana., MD   REQUESTING/REFERRING PHYSICIAN: Rip Harbour  CHIEF COMPLAINT:  diarrhea  HISTORY OF PRESENT ILLNESS:  Renee Powell  is a 83 y.o. 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 Patient is reporting generali abdominal pain but denies any blood in her stool. No recent travel . Had one episode of vomiting today Patient is unable to tolerate any diet as she is nauseous, feeling weak and tired. Denies any fever. No sick contacts  PAST MEDICAL HISTORY:   Past Medical History:  Diagnosis Date  . Breast cancer [facility]) 2002   right lumpectomy with radiation  . Cancer [facility]) 2002   breast   Hypothyroidism, hyperlipidemia PAST SURGICAL HISTOIRY:   Past Surgical History:  Procedure Laterality Date  . BREAST SURGERY Right 2002   lumpectomy    SOCIAL HISTORY:   Social History  Substance Use Topics  . Smoking status: Never Smoker  . Smokeless tobacco: Never Used  . Alcohol use No    FAMILY HISTORY:   Family History  Problem Relation Age of Onset  . Cancer Father        lung cancer  . Cancer Maternal Uncle   . Cancer Paternal Aunt        Breast  . Cancer Paternal Uncle        Kidney  . Cancer Paternal Uncle        Lung  . Heart attack Mother   . Cancer Paternal Grandmother        Breast  . Breast cancer Maternal Aunt 60  . Breast cancer Maternal Grandmother 60    DRUG ALLERGIES:  No Known Allergies  REVIEW OF SYSTEMS:  CONSTITUTIONAL: No fever, fatigue or weakness.  EYES: No blurred or double vision.  EARS, NOSE, AND THROAT: No tinnitus or ear pain.  RESPIRATORY: No cough, shortness of breath, wheezing or hemoptysis.   CARDIOVASCULAR: No chest pain, orthopnea, edema.  GASTROINTESTINAL: reportingnausea, vomiting from today morning, Reporting diarrhea from last Tuesday and generalized abdominal pain GENITOURINARY: No dysuria, hematuria.  ENDOCRINE: No polyuria, nocturia,  HEMATOLOGY: No anemia, easy bruising or bleeding SKIN: No rash or lesion. MUSCULOSKELETAL: No joint pain or arthritis.   NEUROLOGIC: No tingling, numbness, weakness.  PSYCHIATRY: No anxiety or depression.   MEDICATIONS AT HOME:   Prior to Admission medications   Medication Sig Start Date End Date Taking? Authorizing Provider  SYNTHROID 75 MCG tablet Take 75 mcg by mouth daily. 01/24/17  Yes [provider]      VITAL SIGNS:  Blood pressure 116/62, pulse 75, temperature 98.2 F (36.8 C), temperature source Oral, resp. rate 17, height 5\' 5"  (1.651 m), weight 71.7 kg (158 lb), SpO2 97 %.  PHYSICAL EXAMINATION:  GENERAL:  83 y.o.-year-old patient lying in the bed with no acute distress.  EYES: Pupils equal, round, reactive to light and accommodation. No scleral icterus. Extraocular muscles intact.  HEENT: Head atraumatic, normocephalic. Oropharynx and nasopharynx clear. Dry mucous membranes NECK:  Supple, no jugular venous distention. No thyroid enlargement, no tenderness.  LUNGS: Normal breath sounds bilaterally, no wheezing, rales,rhonchi or crepitation. No use of accessory muscles of respiration.  CARDIOVASCULAR: S1, S2 normal. No murmurs, rubs, or  gallops.  ABDOMEN: Soft, minimal diffuse discomfort is present ,nontender nondistended. Bowel sounds present. No organomegaly or mass.  EXTREMITIES: No pedal edema, cyanosis, or clubbing.  NEUROLOGIC: Cranial nerves II through XII are intact. Muscle strength 5/5 in all extremities. Sensation intact. Gait not checked.  PSYCHIATRIC: The patient is alert and oriented x 3.  SKIN: No obvious rash, lesion, or ulcer.   LABORATORY PANEL:   CBC  Recent Labs Lab 05/03/17 0707   WBC 7.7  HGB 12.7  HCT 37.1  PLT 201   ------------------------------------------------------------------------------------------------------------------  Chemistries   Recent Labs Lab 05/03/17 0707  NA 136  K 3.2*  CL 104  CO2 22  GLUCOSE 115*  BUN 22*  CREATININE 1.12*  CALCIUM 9.0  AST 22  ALT 15  ALKPHOS 75  BILITOT 1.4*   ------------------------------------------------------------------------------------------------------------------  Cardiac Enzymes No results for input(s): TROPONINI in the last 168 hours. ------------------------------------------------------------------------------------------------------------------  RADIOLOGY:  Ct Abdomen Pelvis W Contrast  Result Date: 05/03/2017 CLINICAL DATA:  Diarrhea. EXAM: CT ABDOMEN AND PELVIS WITH CONTRAST TECHNIQUE: Multidetector CT imaging of the abdomen and pelvis was performed using the standard protocol following bolus administration of intravenous contrast. CONTRAST:  14mL ISOVUE-300 IOPAMIDOL (ISOVUE-300) INJECTION 61% COMPARISON:  None. FINDINGS: Lower chest:  Unremarkable. Hepatobiliary: Tiny hypodensities in the liver parenchyma are too small to characterize but likely cysts. 13 mm noncalcified gallstone evident. No intrahepatic or extrahepatic biliary dilation. Pancreas: No focal mass lesion. No dilatation of the main duct. No intraparenchymal cyst. No peripancreatic edema. Spleen: No splenomegaly. No focal mass lesion. Adrenals/Urinary Tract: No adrenal nodule or mass. Central sinus cysts noted in both kidneys. No evidence for hydroureter. The urinary bladder appears normal for the degree of distention. Stomach/Bowel: Small to moderate hiatal hernia. Extremely large duodenal diverticulum evident. Duodenum is normally positioned as is the ligament of Treitz. No small bowel wall thickening. No small bowel dilatation. Numerous small bowel diverticuli are identified. The terminal ileum is normal. The appendix is normal.  Diverticular changes are noted in the left colon without evidence of diverticulitis. Vascular/Lymphatic: There is abdominal aortic atherosclerosis without aneurysm. Portal vein and superior mesenteric vein are patent. Celiac axis and SMA are opacified. There is no gastrohepatic or hepatoduodenal ligament lymphadenopathy. No intraperitoneal or retroperitoneal lymphadenopathy. No pelvic sidewall lymphadenopathy. Reproductive: The uterus has normal CT imaging appearance. There is no adnexal mass. Other: No intraperitoneal free fluid. Musculoskeletal: Pelvic floor laxity evident. Bone windows reveal no worrisome lytic or sclerotic osseous lesions. IMPRESSION: 1. No acute findings in the abdomen or pelvis. No small or large bowel wall thickening in this patient with a reported history of diarrhea. 2. 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 No features on today's study to suggest diverticulitis. 3.  Aortic Atherosclerois (ICD10-170.0) 4. Cholelithiasis. Electronically Signed   By: Misty Stanley M.D.   On: 05/03/2017 09:32   Dg Abdomen Acute W/chest  Result Date: 05/01/2017 CLINICAL DATA:  Abdominal distention, diarrhea EXAM: DG ABDOMEN ACUTE W/ 1V CHEST COMPARISON:  None. FINDINGS: Lungs are clear.  No pleural effusion or pneumothorax. The heart is normal in size. Small hiatal hernia. Nonobstructive bowel gas pattern. No evidence of free air under the diaphragm on the upright view. Visualized osseous structures are within normal limits. IMPRESSION: No evidence of acute cardiopulmonary disease. No evidence of small bowel obstruction or free air. Small hiatal hernia. Electronically Signed   By: Julian Hy M.D.   On: 05/01/2017 20:20    EKG:  No orders found for this  or any previous visit.  IMPRESSION AND PLAN:   Renee Powell  is a 83 y.o. female with a known history of hypothyroidism, hyperlipidemia, breast cancer is presenting to the ED with a chef complaint of  intractable diarrhea starting from last Tuesday. Patient is reporting generali abdominal pain but denies any blood in her stool. No recent travel .   # Acute gastroenteritis Admit patient to MedSurg unit Low-fat diet as tolerated Stool for C. Difficile toxin is negative, GI panel is negative Hydrate with IV fluids Antiemetics, supportive treatment Pepcid  #Dehydration secondary to acute gastroenteritis Hydrate with IV fluids and monitor vital signs closely  #Hypokalemia Replete and recheck. Check magnesium also  #hypothyroidism continue Synthroid check TSH in a.m.  #Hyperlipidemia patient is not on any medications check fasting  Lipid panel  GI prophylaxis Pepcid DVT prophylaxis Lovenox subcutaneous  All the records are reviewed and case discussed with ED provider. Management plans discussed with the patient, family and they are in agreement.  CODE STATUS: FC  TOTAL TIME TAKING CARE OF THIS PATIENT: 42 minutes.   Note: This dictation was prepared with Dragon dictation along with smaller phrase technology. Any transcriptional errors that result from this process are unintentional.  Nicholes Mango M.D on 05/03/2017 at 1:14 PM  Between 7am to 6pm - Pager - [phone]  After 6pm go to [email or web address] - password [email or web address] [facility]  [facility]  Office  [phone]  CC: Primary care physician; Jerrol Banana., MD

## 2017-05-03 NOTE — ED Notes (Signed)
Pt. States diarrhea that started on Wednesday.  Pt. States she was seen here on Friday for same symptoms.  Pt. Was given imodium.  Pt. States she has been taking as directed with no relief.  Pt. States diarrhea started again tonight around midnight.  Pt. States diarrhea multiple times.  Pt. States watery diarrhea that is dark.

## 2017-05-03 NOTE — Care Management Obs Status (Signed)
[place] NOTIFICATION   Patient Details  Name: JAVANNA PATIN MRN: [id number] Date of Birth: August 17, 1934   Medicare Observation Status Notification Given:  Yes    Harwood Nall A, RN 05/03/2017, 2:45 PM

## 2017-05-03 NOTE — Consult Note (Signed)
Referring Provider: Dr. Cinda Quest Primary Care Physician:  Jerrol Banana., MD Primary Gastroenterologist:  Althia Forts  Reason for Consultation:  Diarrhea  HPI: Renee Powell is a 83 y.o. female with acute onset of profuse watery diarrhea that started this past Wednesday and has persisted. Continues to have profuse nonbloody diarrhea with crampy abdominal pain. Vomited once yesterday with nausea. Feels weak. Denies dizziness or lightheadedness. Denies rectal bleeding, hematochezia, or melena. C. Diff and GI pathogen panel negative. CT negative for inflammation. In ER 2 days ago and sent home but came back due to ongoing diarrhea. Has diarrhea everytime she eats or drinks anything. Denies any Abx prior to the onset of the diarrhea. Has never had a colonoscopy.  Past Medical History:  Diagnosis Date  . Breast cancer [facility]) 2002   right lumpectomy with radiation  . Cancer [facility]) 2002   breast    Past Surgical History:  Procedure Laterality Date  . BREAST SURGERY Right 2002   lumpectomy    Prior to Admission medications   Medication Sig Start Date End Date Taking? Authorizing Provider  SYNTHROID 75 MCG tablet Take 75 mcg by mouth daily. 01/24/17  Yes [provider]    Scheduled Meds: . enoxaparin (LOVENOX) injection  40 mg Subcutaneous Q24H  . [START ON 05/04/2017] levothyroxine  75 mcg Oral QAC breakfast   Continuous Infusions: . 0.9 % NaCl with KCl 20 mEq / L 75 mL/hr at 05/03/17 1518   PRN Meds:.acetaminophen **OR** acetaminophen, loperamide, ondansetron **OR** ondansetron (ZOFRAN) IV, traMADol  Allergies as of 05/03/2017  . (No Known Allergies)    Family History  Problem Relation Age of Onset  . Cancer Father        lung cancer  . Cancer Maternal Uncle   . Cancer Paternal Aunt        Breast  . Cancer Paternal Uncle        Kidney  . Cancer Paternal Uncle        Lung  . Heart attack Mother   . Cancer Paternal Grandmother        Breast  . Breast cancer  Maternal Aunt 60  . Breast cancer Maternal Grandmother 60    Social History   Social History  . Marital status: Married    Spouse name: N/A  . Number of children: N/A  . Years of education: N/A   Occupational History  . Not on file.   Social History Main Topics  . Smoking status: Never Smoker  . Smokeless tobacco: Never Used  . Alcohol use No  . Drug use: No  . Sexual activity: Not on file   Other Topics Concern  . Not on file   Social History Narrative  . No narrative on file    Review of Systems: All negative except as stated above in HPI.  Physical Exam: Vital signs: Vitals:   05/03/17 1258 05/03/17 1359  BP: 116/62 (!) 130/51  Pulse: 75 78  Resp: 17 18  Temp:  98 F (36.7 C)  SpO2: 97% 98%   Last BM Date: 05/03/17 General:  Lethargic, elderly, thin, pleasant and cooperative in NAD HEENT: anicteric sclera, oropharynx clear Neck: supple, nontender Lungs:  Clear throughout to auscultation.   No wheezes, crackles, or rhonchi. No acute distress. Heart:  Regular rate and rhythm; no murmurs, clicks, rubs,  or gallops. Abdomen: diffuse tenderness with minimal guarding, soft, nondistended, +BS  Rectal:  Deferred Ext: no edema  GI:  Lab Results:  Recent Labs  05/01/17 1501 05/03/17 0707  WBC 10.7 7.7  HGB 13.3 12.7  HCT 39.8 37.1  PLT 216 201   BMET  Recent Labs  05/01/17 1501 05/03/17 0707  NA 138 136  K 3.6 3.2*  CL 104 104  CO2 26 22  GLUCOSE 97 115*  BUN 23* 22*  CREATININE 1.08* 1.12*  CALCIUM 9.2 9.0   LFT  Recent Labs  05/03/17 0707  PROT 7.0  ALBUMIN 3.8  AST 22  ALT 15  ALKPHOS 75  BILITOT 1.4*   PT/INR No results for input(s): LABPROT, INR in the last 72 hours.   Studies/Results: Ct Abdomen Pelvis W Contrast  Result Date: 05/03/2017 CLINICAL DATA:  Diarrhea. EXAM: CT ABDOMEN AND PELVIS WITH CONTRAST TECHNIQUE: Multidetector CT imaging of the abdomen and pelvis was performed using the standard protocol following  bolus administration of intravenous contrast. CONTRAST:  43mL ISOVUE-300 IOPAMIDOL (ISOVUE-300) INJECTION 61% COMPARISON:  None. FINDINGS: Lower chest:  Unremarkable. Hepatobiliary: Tiny hypodensities in the liver parenchyma are too small to characterize but likely cysts. 13 mm noncalcified gallstone evident. No intrahepatic or extrahepatic biliary dilation. Pancreas: No focal mass lesion. No dilatation of the main duct. No intraparenchymal cyst. No peripancreatic edema. Spleen: No splenomegaly. No focal mass lesion. Adrenals/Urinary Tract: No adrenal nodule or mass. Central sinus cysts noted in both kidneys. No evidence for hydroureter. The urinary bladder appears normal for the degree of distention. Stomach/Bowel: Small to moderate hiatal hernia. Extremely large duodenal diverticulum evident. Duodenum is normally positioned as is the ligament of Treitz. No small bowel wall thickening. No small bowel dilatation. Numerous small bowel diverticuli are identified. The terminal ileum is normal. The appendix is normal. Diverticular changes are noted in the left colon without evidence of diverticulitis. Vascular/Lymphatic: There is abdominal aortic atherosclerosis without aneurysm. Portal vein and superior mesenteric vein are patent. Celiac axis and SMA are opacified. There is no gastrohepatic or hepatoduodenal ligament lymphadenopathy. No intraperitoneal or retroperitoneal lymphadenopathy. No pelvic sidewall lymphadenopathy. Reproductive: The uterus has normal CT imaging appearance. There is no adnexal mass. Other: No intraperitoneal free fluid. Musculoskeletal: Pelvic floor laxity evident. Bone windows reveal no worrisome lytic or sclerotic osseous lesions. IMPRESSION: 1. No acute findings in the abdomen or pelvis. No small or large bowel wall thickening in this patient with a reported history of diarrhea. 2. 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 No features on today's study to  suggest diverticulitis. 3.  Aortic Atherosclerois (ICD10-170.0) 4. Cholelithiasis. Electronically Signed   By: Misty Stanley M.D.   On: 05/03/2017 09:32   Dg Abdomen Acute W/chest  Result Date: 05/01/2017 CLINICAL DATA:  Abdominal distention, diarrhea EXAM: DG ABDOMEN ACUTE W/ 1V CHEST COMPARISON:  None. FINDINGS: Lungs are clear.  No pleural effusion or pneumothorax. The heart is normal in size. Small hiatal hernia. Nonobstructive bowel gas pattern. No evidence of free air under the diaphragm on the upright view. Visualized osseous structures are within normal limits. IMPRESSION: No evidence of acute cardiopulmonary disease. No evidence of small bowel obstruction or free air. Small hiatal hernia. Electronically Signed   By: Julian Hy M.D.   On: 05/01/2017 20:20    Impression/Plan: 83 yo with watery diarrhea with negative C. Diff and GI pathogen panel. Admitted for IVFs and supportive care. Changed to clear liquid diet and increased IVFs to 100 cc/hr. If diarrhea persists, then may need an unprepped flexible sigmoidoscopy tomorrow to further evaluate. Despite negative stool studies, she still could have an infectious etiology and  would avoid Imodium and other anti-motility agents at this time. Dr. Vicente Males to f/u tomorrow and will decide whether a flex sig is needed.     LOS: 0 days   [place] C.  05/03/2017, 4:06 PM

## 2017-05-03 NOTE — ED Notes (Signed)
Pt returned to ED Rm 9 from CT at this time.

## 2017-05-03 NOTE — ED Notes (Signed)
Attempted to call report for pt's admission. Was told that the pt was being reassigned to another bed and to call back in 10 minutes.

## 2017-05-03 NOTE — ED Notes (Signed)
Patient transported to CT at this time. 

## 2017-05-03 NOTE — Progress Notes (Signed)
Prophylactic enoxaparin 30 mg SQ q24h ordered. Patient's CrCl >30 ml/min (39.1 ml/min). Will change to enoxaparin 40mg  SQ q24h and continue to follow.   [place] Resident

## 2017-05-04 DIAGNOSIS — R197 Diarrhea, unspecified: Secondary | ICD-10-CM

## 2017-05-04 LAB — CBC
HEMATOCRIT: 34.5 % — AB (ref 35.0–47.0)
HEMOGLOBIN: 11.8 g/dL — AB (ref 12.0–16.0)
MCH: 29.5 pg (ref 26.0–34.0)
MCHC: 34.1 g/dL (ref 32.0–36.0)
MCV: 86.4 fL (ref 80.0–100.0)
Platelets: 186 10*3/uL (ref 150–440)
RBC: 3.99 MIL/uL (ref 3.80–5.20)
RDW: 13.8 % (ref 11.5–14.5)
WBC: 7.2 10*3/uL (ref 3.6–11.0)

## 2017-05-04 LAB — COMPREHENSIVE METABOLIC PANEL
ALBUMIN: 3.1 g/dL — AB (ref 3.5–5.0)
ALT: 13 U/L — ABNORMAL LOW (ref 14–54)
ANION GAP: 7 (ref 5–15)
AST: 18 U/L (ref 15–41)
Alkaline Phosphatase: 61 U/L (ref 38–126)
BILIRUBIN TOTAL: 1.1 mg/dL (ref 0.3–1.2)
BUN: 14 mg/dL (ref 6–20)
CO2: 23 mmol/L (ref 22–32)
Calcium: 8.3 mg/dL — ABNORMAL LOW (ref 8.9–10.3)
Chloride: 107 mmol/L (ref 101–111)
Creatinine, Ser: 0.84 mg/dL (ref 0.44–1.00)
Glucose, Bld: 100 mg/dL — ABNORMAL HIGH (ref 65–99)
POTASSIUM: 3.1 mmol/L — AB (ref 3.5–5.1)
Sodium: 137 mmol/L (ref 135–145)
TOTAL PROTEIN: 5.7 g/dL — AB (ref 6.5–8.1)

## 2017-05-04 LAB — MAGNESIUM: Magnesium: 1.6 mg/dL — ABNORMAL LOW (ref 1.7–2.4)

## 2017-05-04 LAB — LIPID PANEL
CHOL/HDL RATIO: 5.5 ratio
CHOLESTEROL: 166 mg/dL (ref 0–200)
HDL: 30 mg/dL — AB (ref >40)
LDL Cholesterol: 119 mg/dL — ABNORMAL HIGH (ref 0–99)
Triglycerides: 84 mg/dL (ref <150)
VLDL: 17 mg/dL (ref 0–40)

## 2017-05-04 LAB — TSH: TSH: 2.382 u[IU]/mL (ref 0.350–4.500)

## 2017-05-04 MED ORDER — MAGNESIUM SULFATE 2 GM/50ML IV SOLN
2.0000 g | Freq: Once | INTRAVENOUS | Status: AC
  Administered 2017-05-04: 2 g via INTRAVENOUS
  Filled 2017-05-04: qty 50

## 2017-05-04 MED ORDER — ATORVASTATIN CALCIUM 20 MG PO TABS
40.0000 mg | ORAL_TABLET | Freq: Every day | ORAL | Status: DC
  Filled 2017-05-04 (×2): qty 2

## 2017-05-04 MED ORDER — POTASSIUM CHLORIDE 20 MEQ PO PACK
40.0000 meq | PACK | Freq: Once | ORAL | Status: AC
  Administered 2017-05-04: 40 meq via ORAL
  Filled 2017-05-04: qty 2

## 2017-05-04 NOTE — Progress Notes (Signed)
Renee Bellows MD, MRCP(U.K) [address]  [place], [place] [address]  Main: [place] is being followed for diarrhea  Day 1 of follow up   Subjective: She states she is better, still having diarrhea but improved.    Objective: Vital signs in last 24 hours: Vitals:   05/03/17 1400 05/03/17 2035 05/04/17 0744 05/04/17 0747  BP:  (!) 136/57 (!) 126/52   Pulse:  90 78 76  Resp:  18  18  Temp:  99.3 F (37.4 C)  98.3 F (36.8 C)  TempSrc:  Oral  Oral  SpO2:  97% 96% 96%  Weight: 147 lb 9.6 oz (67 kg)     Height: 5\' 5"  (1.651 m)      Weight change: -10 lb 6.4 oz (-4.717 kg)  Intake/Output Summary (Last 24 hours) at 05/04/17 0840 Last data filed at 05/03/17 1943  Gross per 24 hour  Intake           895.42 ml  Output                0 ml  Net           895.42 ml     Exam: Heart:: Regular rate and rhythm, S1S2 present or without murmur or extra heart sounds Lungs: normal, clear to auscultation and clear to auscultation and percussion Abdomen: soft, nontender, normal bowel sounds   Lab Results: @LABTEST2 @ Micro Results: Recent Results (from the past 240 hour(s))  Gastrointestinal Panel by PCR , Stool     Status: None   Collection Time: 05/01/17  7:37 PM  Result Value Ref Range Status   Campylobacter species NOT DETECTED NOT DETECTED Final   Plesimonas shigelloides NOT DETECTED NOT DETECTED Final   Salmonella species NOT DETECTED NOT DETECTED Final   Yersinia enterocolitica NOT DETECTED NOT DETECTED Final   Vibrio species NOT DETECTED NOT DETECTED Final   Vibrio cholerae NOT DETECTED NOT DETECTED Final   Enteroaggregative E coli (EAEC) NOT DETECTED NOT DETECTED Final   Enteropathogenic E coli (EPEC) NOT DETECTED NOT DETECTED Final   Enterotoxigenic E coli (ETEC) NOT DETECTED NOT DETECTED Final   Shiga like toxin producing E coli (STEC) NOT DETECTED NOT DETECTED Final   Shigella/Enteroinvasive E coli (EIEC) NOT DETECTED NOT  DETECTED Final   Cryptosporidium NOT DETECTED NOT DETECTED Final   Cyclospora cayetanensis NOT DETECTED NOT DETECTED Final   Entamoeba histolytica NOT DETECTED NOT DETECTED Final   Giardia lamblia NOT DETECTED NOT DETECTED Final   Adenovirus F40/41 NOT DETECTED NOT DETECTED Final   Astrovirus NOT DETECTED NOT DETECTED Final   Norovirus GI/GII NOT DETECTED NOT DETECTED Final   Rotavirus A NOT DETECTED NOT DETECTED Final   Sapovirus (I, II, IV, and V) NOT DETECTED NOT DETECTED Final  C difficile quick scan w PCR reflex     Status: None   Collection Time: 05/01/17  7:37 PM  Result Value Ref Range Status   C Diff antigen NEGATIVE NEGATIVE Final   C Diff toxin NEGATIVE NEGATIVE Final   C Diff interpretation No C. difficile detected.  Final  Gastrointestinal Panel by PCR , Stool     Status: None   Collection Time: 05/03/17  7:46 AM  Result Value Ref Range Status   Campylobacter species NOT DETECTED NOT DETECTED Final   Plesimonas shigelloides NOT DETECTED NOT DETECTED Final   Salmonella species NOT DETECTED NOT DETECTED Final   Yersinia enterocolitica NOT DETECTED NOT  DETECTED Final   Vibrio species NOT DETECTED NOT DETECTED Final   Vibrio cholerae NOT DETECTED NOT DETECTED Final   Enteroaggregative E coli (EAEC) NOT DETECTED NOT DETECTED Final   Enteropathogenic E coli (EPEC) NOT DETECTED NOT DETECTED Final   Enterotoxigenic E coli (ETEC) NOT DETECTED NOT DETECTED Final   Shiga like toxin producing E coli (STEC) NOT DETECTED NOT DETECTED Final   Shigella/Enteroinvasive E coli (EIEC) NOT DETECTED NOT DETECTED Final   Cryptosporidium NOT DETECTED NOT DETECTED Final   Cyclospora cayetanensis NOT DETECTED NOT DETECTED Final   Entamoeba histolytica NOT DETECTED NOT DETECTED Final   Giardia lamblia NOT DETECTED NOT DETECTED Final   Adenovirus F40/41 NOT DETECTED NOT DETECTED Final   Astrovirus NOT DETECTED NOT DETECTED Final   Norovirus GI/GII NOT DETECTED NOT DETECTED Final   Rotavirus A  NOT DETECTED NOT DETECTED Final   Sapovirus (I, II, IV, and V) NOT DETECTED NOT DETECTED Final  C difficile quick scan w PCR reflex     Status: None   Collection Time: 05/03/17  7:46 AM  Result Value Ref Range Status   C Diff antigen NEGATIVE NEGATIVE Final   C Diff toxin NEGATIVE NEGATIVE Final   C Diff interpretation No C. difficile detected.  Final   Studies/Results: Ct Abdomen Pelvis W Contrast  Result Date: 05/03/2017 CLINICAL DATA:  Diarrhea. EXAM: CT ABDOMEN AND PELVIS WITH CONTRAST TECHNIQUE: Multidetector CT imaging of the abdomen and pelvis was performed using the standard protocol following bolus administration of intravenous contrast. CONTRAST:  52mL ISOVUE-300 IOPAMIDOL (ISOVUE-300) INJECTION 61% COMPARISON:  None. FINDINGS: Lower chest:  Unremarkable. Hepatobiliary: Tiny hypodensities in the liver parenchyma are too small to characterize but likely cysts. 13 mm noncalcified gallstone evident. No intrahepatic or extrahepatic biliary dilation. Pancreas: No focal mass lesion. No dilatation of the main duct. No intraparenchymal cyst. No peripancreatic edema. Spleen: No splenomegaly. No focal mass lesion. Adrenals/Urinary Tract: No adrenal nodule or mass. Central sinus cysts noted in both kidneys. No evidence for hydroureter. The urinary bladder appears normal for the degree of distention. Stomach/Bowel: Small to moderate hiatal hernia. Extremely large duodenal diverticulum evident. Duodenum is normally positioned as is the ligament of Treitz. No small bowel wall thickening. No small bowel dilatation. Numerous small bowel diverticuli are identified. The terminal ileum is normal. The appendix is normal. Diverticular changes are noted in the left colon without evidence of diverticulitis. Vascular/Lymphatic: There is abdominal aortic atherosclerosis without aneurysm. Portal vein and superior mesenteric vein are patent. Celiac axis and SMA are opacified. There is no gastrohepatic or hepatoduodenal  ligament lymphadenopathy. No intraperitoneal or retroperitoneal lymphadenopathy. No pelvic sidewall lymphadenopathy. Reproductive: The uterus has normal CT imaging appearance. There is no adnexal mass. Other: No intraperitoneal free fluid. Musculoskeletal: Pelvic floor laxity evident. Bone windows reveal no worrisome lytic or sclerotic osseous lesions. IMPRESSION: 1. No acute findings in the abdomen or pelvis. No small or large bowel wall thickening in this patient with a reported history of diarrhea. 2. 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 No features on today's study to suggest diverticulitis. 3.  Aortic Atherosclerois (ICD10-170.0) 4. Cholelithiasis. Electronically Signed   By: Misty Stanley M.D.   On: 05/03/2017 09:32   Medications: I have reviewed the patient's current medications. Scheduled Meds: . enoxaparin (LOVENOX) injection  40 mg Subcutaneous Q24H  . levothyroxine  75 mcg Oral QAC breakfast   Continuous Infusions: . 0.9 % NaCl with KCl 20 mEq / L 100 mL/hr at 05/03/17 1621  PRN Meds:.acetaminophen **OR** acetaminophen, ondansetron **OR** ondansetron (ZOFRAN) IV, traMADol   Assessment: Active Problems:   Acute gastroenteritis   Renee Powell 83 y.o. female admitted with acute diarrhea for less than a week , non bloody , never had a colonoscopy. TSH, GI PCR and c diff panel -negative . Better today per patient but still has diarrhea   Plan: 1. Noted her food tray today had ginger ale which contains fructose syrup, jello which has artificial sugars both can cause diarrhea . She possibly could have a viral GE. 2. Suggest a BRAT diet with no artificial sugars or high fructose corn syrup. If no better tomorrow then will need full colonoscopy with TI biopsies on Wednesday to r/o microscopic colitis   LOS: 0 days   Renee Powell 05/04/2017, 8:40 AM

## 2017-05-04 NOTE — Progress Notes (Signed)
[place] at [place] NAME: Renee Powell    MR#:  [id number]  DATE OF BIRTH:  1933-09-25  SUBJECTIVE:   Came in after having repeated increasing number of diarrheal stools. Mild nausea. Out in the chair. Tolerating liquid diet. Diarrhea decreasing in frequency. REVIEW OF SYSTEMS:   Review of Systems  Constitutional: Negative for chills, fever and weight loss.  HENT: Negative for ear discharge, ear pain and nosebleeds.   Eyes: Negative for blurred vision, pain and discharge.  Respiratory: Negative for sputum production, shortness of breath, wheezing and stridor.   Cardiovascular: Negative for chest pain, palpitations, orthopnea and PND.  Gastrointestinal: Positive for diarrhea. Negative for abdominal pain, nausea and vomiting.  Genitourinary: Negative for frequency and urgency.  Musculoskeletal: Negative for back pain and joint pain.  Neurological: Negative for sensory change, speech change, focal weakness and weakness.  Psychiatric/Behavioral: Negative for depression and hallucinations. The patient is not nervous/anxious.    Tolerating Diet:yes Tolerating PT: ambulatory  DRUG ALLERGIES:   Allergies  Allergen Reactions  . Latex     VITALS:  Blood pressure (!) 126/52, pulse 76, temperature 98.3 F (36.8 C), temperature source Oral, resp. rate 18, height 5\' 5"  (1.651 m), weight 67 kg (147 lb 9.6 oz), SpO2 96 %.  PHYSICAL EXAMINATION:   Physical Exam  GENERAL:  83 y.o.-year-old patient lying in the bed with no acute distress.  EYES: Pupils equal, round, reactive to light and accommodation. No scleral icterus. Extraocular muscles intact.  HEENT: Head atraumatic, normocephalic. Oropharynx and nasopharynx clear.  NECK:  Supple, no jugular venous distention. No thyroid enlargement, no tenderness.  LUNGS: Normal breath sounds bilaterally, no wheezing, rales, rhonchi. No use of accessory muscles of respiration.  CARDIOVASCULAR: S1,  S2 normal. No murmurs, rubs, or gallops.  ABDOMEN: Soft, nontender, nondistended. Bowel sounds present. No organomegaly or mass.  EXTREMITIES: No cyanosis, clubbing or edema b/l.    NEUROLOGIC: Cranial nerves II through XII are intact. No focal Motor or sensory deficits b/l.   PSYCHIATRIC:  patient is alert and oriented x 3.  SKIN: No obvious rash, lesion, or ulcer.   LABORATORY PANEL:  CBC  Recent Labs Lab 05/04/17 0313  WBC 7.2  HGB 11.8*  HCT 34.5*  PLT 186    Chemistries   Recent Labs Lab 05/04/17 0313  NA 137  K 3.1*  CL 107  CO2 23  GLUCOSE 100*  BUN 14  CREATININE 0.84  CALCIUM 8.3*  MG 1.6*  AST 18  ALT 13*  ALKPHOS 61  BILITOT 1.1   Cardiac Enzymes No results for input(s): TROPONINI in the last 168 hours. RADIOLOGY:  Ct Abdomen Pelvis W Contrast  Result Date: 05/03/2017 CLINICAL DATA:  Diarrhea. EXAM: CT ABDOMEN AND PELVIS WITH CONTRAST TECHNIQUE: Multidetector CT imaging of the abdomen and pelvis was performed using the standard protocol following bolus administration of intravenous contrast. CONTRAST:  39mL ISOVUE-300 IOPAMIDOL (ISOVUE-300) INJECTION 61% COMPARISON:  None. FINDINGS: Lower chest:  Unremarkable. Hepatobiliary: Tiny hypodensities in the liver parenchyma are too small to characterize but likely cysts. 13 mm noncalcified gallstone evident. No intrahepatic or extrahepatic biliary dilation. Pancreas: No focal mass lesion. No dilatation of the main duct. No intraparenchymal cyst. No peripancreatic edema. Spleen: No splenomegaly. No focal mass lesion. Adrenals/Urinary Tract: No adrenal nodule or mass. Central sinus cysts noted in both kidneys. No evidence for hydroureter. The urinary bladder appears normal for the degree of distention. Stomach/Bowel: Small to moderate hiatal hernia. Extremely  large duodenal diverticulum evident. Duodenum is normally positioned as is the ligament of Treitz. No small bowel wall thickening. No small bowel dilatation.  Numerous small bowel diverticuli are identified. The terminal ileum is normal. The appendix is normal. Diverticular changes are noted in the left colon without evidence of diverticulitis. Vascular/Lymphatic: There is abdominal aortic atherosclerosis without aneurysm. Portal vein and superior mesenteric vein are patent. Celiac axis and SMA are opacified. There is no gastrohepatic or hepatoduodenal ligament lymphadenopathy. No intraperitoneal or retroperitoneal lymphadenopathy. No pelvic sidewall lymphadenopathy. Reproductive: The uterus has normal CT imaging appearance. There is no adnexal mass. Other: No intraperitoneal free fluid. Musculoskeletal: Pelvic floor laxity evident. Bone windows reveal no worrisome lytic or sclerotic osseous lesions. IMPRESSION: 1. No acute findings in the abdomen or pelvis. No small or large bowel wall thickening in this patient with a reported history of diarrhea. 2. 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 No features on today's study to suggest diverticulitis. 3.  Aortic Atherosclerois (ICD10-170.0) 4. Cholelithiasis. Electronically Signed   By: Misty Stanley M.D.   On: 05/03/2017 09:32   ASSESSMENT AND PLAN:   Renee Powell  is a 83 y.o. 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 Patient is reporting generali abdominal pain but denies any blood in her stool. No recent travel .   # Acute gastroenteritis appear viral vs food related BRAT diet per GI Stool for C. Difficile toxin is negative, GI panel is negative Hydrate with IV fluids Antiemetics, supportive treatment Pepcid Seen by GI  #Dehydration secondary to acute gastroenteritis Hydrate with IV fluids and monitor vital signs closely  #Hypokalemia Replete and recheck.   #hypothyroidism continue Synthroid   #Hyperlipidemia patient is not on  any medications  -start po statins  GI prophylaxis Pepcid DVT prophylaxis Lovenox subcutaneous   Case discussed with Care Management/Social Worker. Management plans discussed with the patient, family and they are in agreement.  CODE STATUS: FULL  DVT Prophylaxis: lovenox  TOTAL TIME TAKING CARE OF THIS PATIENT: *30* minutes.  >50% time spent on counselling and coordination of care  POSSIBLE D/C IN *1-2* DAYS, DEPENDING ON CLINICAL CONDITION.  Note: This dictation was prepared with Dragon dictation along with smaller phrase technology. Any transcriptional errors that result from this process are unintentional.  Renee Powell M.D on 05/04/2017 at 1:44 PM  Between 7am to 6pm - Pager - [phone]  After 6pm go to [email or web address] - password [email or web address] [facility]  Office  [phone]  CC: Primary care physician; Jerrol Banana., MD

## 2017-05-05 DIAGNOSIS — R197 Diarrhea, unspecified: Secondary | ICD-10-CM | POA: Diagnosis not present

## 2017-05-05 LAB — POTASSIUM: POTASSIUM: 3.3 mmol/L — AB (ref 3.5–5.1)

## 2017-05-05 LAB — MAGNESIUM: MAGNESIUM: 2 mg/dL (ref 1.7–2.4)

## 2017-05-05 MED ORDER — LOPERAMIDE HCL 2 MG PO CAPS
2.0000 mg | ORAL_CAPSULE | Freq: Four times a day (QID) | ORAL | Status: DC | PRN
  Administered 2017-05-05 – 2017-05-07 (×2): 2 mg via ORAL
  Filled 2017-05-05 (×2): qty 1

## 2017-05-05 MED ORDER — FAMOTIDINE 20 MG PO TABS
20.0000 mg | ORAL_TABLET | Freq: Every day | ORAL | Status: DC
  Administered 2017-05-05 – 2017-05-07 (×2): 20 mg via ORAL
  Filled 2017-05-05 (×2): qty 1

## 2017-05-05 MED ORDER — SODIUM CHLORIDE 0.9 % IV SOLN
INTRAVENOUS | Status: DC
  Administered 2017-05-05: 20 mL/h via INTRAVENOUS

## 2017-05-05 MED ORDER — PEG 3350-KCL-NA BICARB-NACL 420 G PO SOLR
4000.0000 mL | Freq: Once | ORAL | Status: AC
  Administered 2017-05-05: 4000 mL via ORAL
  Filled 2017-05-05: qty 4000

## 2017-05-05 MED ORDER — POTASSIUM CHLORIDE 20 MEQ PO PACK
40.0000 meq | PACK | Freq: Once | ORAL | Status: AC
  Administered 2017-05-05: 40 meq via ORAL
  Filled 2017-05-05: qty 2

## 2017-05-05 NOTE — Progress Notes (Signed)
[name] MD, MRCP(U.K) [address]  [place]  [place], [place] [address]  Main: [phone]    Renee Powell is being followed for diarrhea    Subjective: Still having diarrhea 9 episodes last night    Objective: Vital signs in last 24 hours: Vitals:   05/04/17 0744 05/04/17 0747 05/04/17 1936 05/05/17 0759  BP: (!) 126/52  (!) 121/58 (!) 141/56  Pulse: 78 76 86 92  Resp:  18 18 18   Temp:  98.3 F (36.8 C) 99 F (37.2 C) (!) 97.5 F (36.4 C)  TempSrc:  Oral Oral Oral  SpO2: 96% 96% 97% 93%  Weight:      Height:       Weight change:   Intake/Output Summary (Last 24 hours) at 05/05/17 1136 Last data filed at 05/05/17 0902  Gross per 24 hour  Intake          4036.67 ml  Output                0 ml  Net          4036.67 ml     Exam: Heart:: Regular rate and rhythm, S1S2 present or without murmur or extra heart sounds Lungs: normal, clear to auscultation and clear to auscultation and percussion Abdomen: soft, nontender, normal bowel sounds   Lab Results: @LABTEST2 @ Micro Results: Recent Results (from the past 240 hour(s))  Gastrointestinal Panel by PCR , Stool     Status: None   Collection Time: 05/01/17  7:37 PM  Result Value Ref Range Status   Campylobacter species NOT DETECTED NOT DETECTED Final   Plesimonas shigelloides NOT DETECTED NOT DETECTED Final   Salmonella species NOT DETECTED NOT DETECTED Final   Yersinia enterocolitica NOT DETECTED NOT DETECTED Final   Vibrio species NOT DETECTED NOT DETECTED Final   Vibrio cholerae NOT DETECTED NOT DETECTED Final   Enteroaggregative E coli (EAEC) NOT DETECTED NOT DETECTED Final   Enteropathogenic E coli (EPEC) NOT DETECTED NOT DETECTED Final   Enterotoxigenic E coli (ETEC) NOT DETECTED NOT DETECTED Final   Shiga like toxin producing E coli (STEC) NOT DETECTED NOT DETECTED Final   Shigella/Enteroinvasive E coli (EIEC) NOT DETECTED NOT DETECTED Final   Cryptosporidium NOT DETECTED NOT DETECTED  Final   Cyclospora cayetanensis NOT DETECTED NOT DETECTED Final   Entamoeba histolytica NOT DETECTED NOT DETECTED Final   Giardia lamblia NOT DETECTED NOT DETECTED Final   Adenovirus F40/41 NOT DETECTED NOT DETECTED Final   Astrovirus NOT DETECTED NOT DETECTED Final   Norovirus GI/GII NOT DETECTED NOT DETECTED Final   Rotavirus A NOT DETECTED NOT DETECTED Final   Sapovirus (I, II, IV, and V) NOT DETECTED NOT DETECTED Final  C difficile quick scan w PCR reflex     Status: None   Collection Time: 05/01/17  7:37 PM  Result Value Ref Range Status   C Diff antigen NEGATIVE NEGATIVE Final   C Diff toxin NEGATIVE NEGATIVE Final   C Diff interpretation No C. difficile detected.  Final  Gastrointestinal Panel by PCR , Stool     Status: None   Collection Time: 05/03/17  7:46 AM  Result Value Ref Range Status   Campylobacter species NOT DETECTED NOT DETECTED Final   Plesimonas shigelloides NOT DETECTED NOT DETECTED Final   Salmonella species NOT DETECTED NOT DETECTED Final   Yersinia enterocolitica NOT DETECTED NOT DETECTED Final   Vibrio species NOT DETECTED NOT DETECTED Final   Vibrio cholerae NOT DETECTED NOT  DETECTED Final   Enteroaggregative E coli (EAEC) NOT DETECTED NOT DETECTED Final   Enteropathogenic E coli (EPEC) NOT DETECTED NOT DETECTED Final   Enterotoxigenic E coli (ETEC) NOT DETECTED NOT DETECTED Final   Shiga like toxin producing E coli (STEC) NOT DETECTED NOT DETECTED Final   Shigella/Enteroinvasive E coli (EIEC) NOT DETECTED NOT DETECTED Final   Cryptosporidium NOT DETECTED NOT DETECTED Final   Cyclospora cayetanensis NOT DETECTED NOT DETECTED Final   Entamoeba histolytica NOT DETECTED NOT DETECTED Final   Giardia lamblia NOT DETECTED NOT DETECTED Final   Adenovirus F40/41 NOT DETECTED NOT DETECTED Final   Astrovirus NOT DETECTED NOT DETECTED Final   Norovirus GI/GII NOT DETECTED NOT DETECTED Final   Rotavirus A NOT DETECTED NOT DETECTED Final   Sapovirus (I, II, IV, and  V) NOT DETECTED NOT DETECTED Final  C difficile quick scan w PCR reflex     Status: None   Collection Time: 05/03/17  7:46 AM  Result Value Ref Range Status   C Diff antigen NEGATIVE NEGATIVE Final   C Diff toxin NEGATIVE NEGATIVE Final   C Diff interpretation No C. difficile detected.  Final   Studies/Results: No results found. Medications: I have reviewed the patient's current medications. Scheduled Meds: . atorvastatin  40 mg Oral q1800  . enoxaparin (LOVENOX) injection  40 mg Subcutaneous Q24H  . levothyroxine  75 mcg Oral QAC breakfast   Continuous Infusions: PRN Meds:.acetaminophen **OR** acetaminophen, loperamide, ondansetron **OR** ondansetron (ZOFRAN) IV, traMADol   Assessment: Active Problems:   Acute gastroenteritis  Renee Powell 83 y.o. female admitted with acute diarrhea for less than a week , non bloody , never had a colonoscopy. TSH, GI PCR and c diff panel -negative . Still has diarrhea   Plan: 1. Colonoscopy tomorrow to evaluate for colitis   I have discussed alternative options, risks & benefits,  which include, but are not limited to, bleeding, infection, perforation,respiratory complication & drug reaction.  The patient agrees with this plan & written consent will be obtained.      LOS: 0 days   [name] 05/05/2017, 11:36 AM

## 2017-05-05 NOTE — Care Management (Signed)
Barriers to discharge: Continues to have loose stools. GI consulted and will do colonoscopy tomorrow to r/o micro colitis.

## 2017-05-05 NOTE — Progress Notes (Signed)
Renee Powell at [place] NAME: Renee Powell    MR#:  [id number]  DATE OF BIRTH:  07/03/1934  SUBJECTIVE:   Came in after having repeated increasing number of diarrheal stools. Mild nausea. Patient continued to have several bowel movements lose throughout the night She does not like the current BRAT diet Husband in the room REVIEW OF SYSTEMS:   Review of Systems  Constitutional: Negative for chills, fever and weight loss.  HENT: Negative for ear discharge, ear pain and nosebleeds.   Eyes: Negative for blurred vision, pain and discharge.  Respiratory: Negative for sputum production, shortness of breath, wheezing and stridor.   Cardiovascular: Negative for chest pain, palpitations, orthopnea and PND.  Gastrointestinal: Positive for diarrhea. Negative for abdominal pain, nausea and vomiting.  Genitourinary: Negative for frequency and urgency.  Musculoskeletal: Negative for back pain and joint pain.  Neurological: Negative for sensory change, speech change, focal weakness and weakness.  Psychiatric/Behavioral: Negative for depression and hallucinations. The patient is not nervous/anxious.    Tolerating Diet:yes Tolerating PT: ambulatory  DRUG ALLERGIES:   Allergies  Allergen Reactions  . Latex     VITALS:  Blood pressure (!) 141/56, pulse 92, temperature (!) 97.5 F (36.4 C), temperature source Oral, resp. rate 18, height 5\' 5"  (1.651 m), weight 67 kg (147 lb 9.6 oz), SpO2 93 %.  PHYSICAL EXAMINATION:   Physical Exam  GENERAL:  83 y.o.-year-old patient lying in the bed with no acute distress.  EYES: Pupils equal, round, reactive to light and accommodation. No scleral icterus. Extraocular muscles intact.  HEENT: Head atraumatic, normocephalic. Oropharynx and nasopharynx clear.  NECK:  Supple, no jugular venous distention. No thyroid enlargement, no tenderness.  LUNGS: Normal breath sounds bilaterally, no wheezing, rales, rhonchi.  No use of accessory muscles of respiration.  CARDIOVASCULAR: S1, S2 normal. No murmurs, rubs, or gallops.  ABDOMEN: Soft, nontender, nondistended. Bowel sounds present. No organomegaly or mass.  EXTREMITIES: No cyanosis, clubbing or edema b/l.    NEUROLOGIC: Cranial nerves II through XII are intact. No focal Motor or sensory deficits b/l.   PSYCHIATRIC:  patient is alert and oriented x 3.  SKIN: No obvious rash, lesion, or ulcer.   LABORATORY PANEL:  CBC  Recent Labs Lab 05/04/17 0313  WBC 7.2  HGB 11.8*  HCT 34.5*  PLT 186    Chemistries   Recent Labs Lab 05/04/17 0313  NA 137  K 3.1*  CL 107  CO2 23  GLUCOSE 100*  BUN 14  CREATININE 0.84  CALCIUM 8.3*  MG 1.6*  AST 18  ALT 13*  ALKPHOS 61  BILITOT 1.1   Cardiac Enzymes No results for input(s): TROPONINI in the last 168 hours. RADIOLOGY:  No results found. ASSESSMENT AND PLAN:   [name]  is a 83 y.o. 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 Patient is reporting generali abdominal pain but denies any blood in her stool. No recent travel .   # Acute gastroenteritis appear viral vs food related BRAT diet per GI Stool for C. Difficile toxin is negative, GI panel is negative received IV fluids---encouraging oral fluids Antiemetics, supportive treatment Pepcid Seen by GI----colonoscopy tomorrow to rule out microscopic colitis We'll start her on when necessary Imodium  #Dehydration secondary to acute gastroenteritis Received IV fluids  #Hypokalemia Replete and recheck.   #hypothyroidism continue Synthroid   #  Hyperlipidemia patient is not on any medications  -start po statins  GI prophylaxis Pepcid DVT prophylaxis Lovenox subcutaneous   Case discussed with Care Management/Social Worker. Management plans discussed with the patient, family and they are in  agreement.  CODE STATUS: FULL  DVT Prophylaxis: lovenox  TOTAL TIME TAKING CARE OF THIS PATIENT: *30* minutes.  >50% time spent on counselling and coordination of care  POSSIBLE D/C IN *1-2* DAYS, DEPENDING ON CLINICAL CONDITION.  Note: This dictation was prepared with Dragon dictation along with smaller phrase technology. Any transcriptional errors that result from this process are unintentional.  Elaura Calix M.D on 05/05/2017 at 11:49 AM  Between 7am to 6pm - Pager - [phone]  After 6pm go to [email or web address] - password [email or web address] [facility]  Office  [phone]  CC: Primary care physician; Jerrol Banana., MD

## 2017-05-06 ENCOUNTER — Observation Stay: Payer: Medicare Other | Admitting: Anesthesiology

## 2017-05-06 ENCOUNTER — Encounter: Payer: Self-pay | Admitting: Gastroenterology

## 2017-05-06 ENCOUNTER — Encounter
Admission: EM | Disposition: A | Discharge status: Home / Self Care | Payer: Self-pay | Source: Home / Self Care | Attending: Emergency Medicine

## 2017-05-06 DIAGNOSIS — R197 Diarrhea, unspecified: Secondary | ICD-10-CM | POA: Diagnosis not present

## 2017-05-06 HISTORY — PX: COLONOSCOPY WITH PROPOFOL: SHX5780

## 2017-05-06 LAB — CBC
HCT: 36.4 % (ref 35.0–47.0)
Hemoglobin: 12.4 g/dL (ref 12.0–16.0)
MCH: 29.7 pg (ref 26.0–34.0)
MCHC: 34 g/dL (ref 32.0–36.0)
MCV: 87.3 fL (ref 80.0–100.0)
PLATELETS: 214 10*3/uL (ref 150–440)
RBC: 4.17 MIL/uL (ref 3.80–5.20)
RDW: 14 % (ref 11.5–14.5)
WBC: 7.7 10*3/uL (ref 3.6–11.0)

## 2017-05-06 LAB — BASIC METABOLIC PANEL
Anion gap: 7 (ref 5–15)
BUN: 15 mg/dL (ref 6–20)
CHLORIDE: 108 mmol/L (ref 101–111)
CO2: 24 mmol/L (ref 22–32)
CREATININE: 0.74 mg/dL (ref 0.44–1.00)
Calcium: 8.4 mg/dL — ABNORMAL LOW (ref 8.9–10.3)
GFR calc Af Amer: >60 mL/min (ref >60)
GLUCOSE: 96 mg/dL (ref 65–99)
POTASSIUM: 3 mmol/L — AB (ref 3.5–5.1)
SODIUM: 139 mmol/L (ref 135–145)

## 2017-05-06 SURGERY — COLONOSCOPY WITH PROPOFOL
Anesthesia: General

## 2017-05-06 MED ORDER — PROPOFOL 10 MG/ML IV BOLUS
INTRAVENOUS | Status: AC
  Filled 2017-05-06: qty 20

## 2017-05-06 MED ORDER — LIDOCAINE HCL (PF) 2 % IJ SOLN
INTRAMUSCULAR | Status: AC
  Filled 2017-05-06: qty 2

## 2017-05-06 MED ORDER — LOPERAMIDE HCL 1 MG/5ML PO LIQD: 1.0000 mg | Freq: Three times a day (TID) | ORAL | Status: DC

## 2017-05-06 MED ORDER — POTASSIUM CHLORIDE 20 MEQ PO PACK
40.0000 meq | PACK | ORAL | Status: AC
  Filled 2017-05-06 (×2): qty 2

## 2017-05-06 MED ORDER — LOPERAMIDE HCL 2 MG PO CAPS
2.0000 mg | ORAL_CAPSULE | Freq: Three times a day (TID) | ORAL | Status: DC
  Administered 2017-05-06 – 2017-05-07 (×4): 2 mg via ORAL
  Filled 2017-05-06 (×4): qty 1

## 2017-05-06 MED ORDER — DEXAMETHASONE SODIUM PHOSPHATE 10 MG/ML IJ SOLN
INTRAMUSCULAR | Status: AC
  Filled 2017-05-06: qty 2

## 2017-05-06 MED ORDER — PROPOFOL 500 MG/50ML IV EMUL
INTRAVENOUS | Status: DC | PRN
  Administered 2017-05-06: 120 ug/kg/min via INTRAVENOUS

## 2017-05-06 MED ORDER — FENTANYL CITRATE (PF) 100 MCG/2ML IJ SOLN
INTRAMUSCULAR | Status: AC
  Filled 2017-05-06: qty 2

## 2017-05-06 MED ORDER — PHENYLEPHRINE HCL 10 MG/ML IJ SOLN
INTRAMUSCULAR | Status: DC | PRN
  Administered 2017-05-06: 100 ug via INTRAVENOUS

## 2017-05-06 MED ORDER — PHENYLEPHRINE HCL 10 MG/ML IJ SOLN
INTRAMUSCULAR | Status: AC
  Filled 2017-05-06: qty 1

## 2017-05-06 MED ORDER — LIDOCAINE HCL (CARDIAC) 20 MG/ML IV SOLN
INTRAVENOUS | Status: DC | PRN
  Administered 2017-05-06: 2 mL via INTRAVENOUS

## 2017-05-06 MED ORDER — PROPOFOL 10 MG/ML IV BOLUS
INTRAVENOUS | Status: DC | PRN
  Administered 2017-05-06: 30 mg via INTRAVENOUS
  Administered 2017-05-06: 20 mg via INTRAVENOUS

## 2017-05-06 NOTE — Anesthesia Preprocedure Evaluation (Signed)
Anesthesia Evaluation  Patient identified by MRN, date of birth, ID band Patient awake    Reviewed: Allergy & Precautions, NPO status , Patient's Chart, lab work & pertinent test results  Airway Mallampati: III       Dental  (+) Teeth Intact   Pulmonary neg pulmonary ROS,    Pulmonary exam normal        Cardiovascular Exercise Tolerance: Poor  Rhythm:Regular     Neuro/Psych negative neurological ROS  negative psych ROS   GI/Hepatic negative GI ROS, Neg liver ROS,   Endo/Other  negative endocrine ROS  Renal/GU negative Renal ROS     Musculoskeletal   Abdominal Normal abdominal exam  (+)   Peds negative pediatric ROS (+)  Hematology negative hematology ROS (+)   Anesthesia Other Findings   Reproductive/Obstetrics                             Anesthesia Physical Anesthesia Plan  ASA: II  Anesthesia Plan: General   Post-op Pain Management:    Induction: Intravenous  PONV Risk Score and Plan: 0  Airway Management Planned: Natural Airway and Nasal Cannula  Additional Equipment:   Intra-op Plan:   Post-operative Plan:   Informed Consent: I have reviewed the patients History and Physical, chart, labs and discussed the procedure including the risks, benefits and alternatives for the proposed anesthesia with the patient or authorized representative who has indicated his/her understanding and acceptance.     Plan Discussed with: Surgeon  Anesthesia Plan Comments:         Anesthesia Quick Evaluation

## 2017-05-06 NOTE — Op Note (Signed)
[facility] Gastroenterology Patient Name: Renee Powell Procedure Date: 05/06/2017 12:20 PM MRN: [id number] Account #: [id number] Date of Birth: 1934/05/03 Admit Type: Inpatient Age: 83 Room: [facility] ENDO ROOM 1 Gender: Female Note Status: Finalized Procedure:            Colonoscopy Indications:          Clinically significant diarrhea of unexplained origin Providers:            [name] MD, MD Referring MD:         Janine Ores. Rosanna Randy, MD (Referring MD) Medicines:            Monitored Anesthesia Care Complications:        No immediate complications. Procedure:            Pre-Anesthesia Assessment:                       - Prior to the procedure, a History and Physical was                        performed, and patient medications, allergies and                        sensitivities were reviewed. The patient's tolerance of                        previous anesthesia was reviewed.                       - The risks and benefits of the procedure and the                        sedation options and risks were discussed with the                        patient. All questions were answered and informed                        consent was obtained.                       - ASA Grade Assessment: III - A patient with severe                        systemic disease.                       After obtaining informed consent, the colonoscope was                        passed under direct vision. Throughout the procedure,                        the patient's blood pressure, pulse, and oxygen                        saturations were monitored continuously. The                        Colonoscope was introduced through the anus and  advanced to the the terminal ileum. The colonoscopy was                        performed with ease. The patient tolerated the                        procedure well. The quality of the bowel preparation                        was  poor. Findings:      The distal ileum contained a localized area of decreased mucosa vascular       pattern. This was biopsied with a cold forceps for histology.      Multiple small-mouthed diverticula were found in the sigmoid colon.      A 12 mm polyp was found in the sigmoid colon. The polyp was       semi-pedunculated. Polypectomy was not attempted due to inadequate bowel       preparation.      A diffuse area of moderately congested and vascular-pattern-decreased       mucosa was found in the entire colon. Biopsies were taken with a cold       forceps for histology. Biopsies for histology were taken with a cold       forceps from the entire colon for evaluation of microscopic colitis.      The exam was otherwise without abnormality. Impression:           - Preparation of the colon was poor.                       - Decreased mucosa vascular pattern in the distal                        ileum. Biopsied.                       - Diverticulosis in the sigmoid colon.                       - One 12 mm polyp in the sigmoid colon. Resection not                        attempted.                       - Congested and vascular-pattern-decreased mucosa in                        the entire examined colon. Biopsied.                       - The examination was otherwise normal. Recommendation:       - Return patient to hospital ward for ongoing care.                       - Advance diet as tolerated today.                       - Imodium 1 tablet PO TID today. Procedure Code(s):    --- Professional ---  45380, Colonoscopy, flexible; with biopsy, single or                        multiple Diagnosis Code(s):    --- Professional ---                       D12.5, Benign neoplasm of sigmoid colon                       K63.89, Other specified diseases of intestine                       R19.7, Diarrhea, unspecified                       K57.30, Diverticulosis of large intestine  without                        perforation or abscess without bleeding CPT copyright 2016 American Medical Association. All rights reserved. The codes documented in this report are preliminary and upon coder review may  be revised to meet current compliance requirements. [name], MD [name] MD, MD 05/06/2017 1:05:53 PM This report has been signed electronically. Number of Addenda: 0 Note Initiated On: 05/06/2017 12:20 PM Scope Withdrawal Time: 0 hours 13 minutes 7 seconds  Total Procedure Duration: 0 hours 17 minutes 24 seconds       [facility]

## 2017-05-06 NOTE — Anesthesia Post-op Follow-up Note (Signed)
Anesthesia QCDR form completed.        

## 2017-05-06 NOTE — Transfer of Care (Signed)
Immediate Anesthesia Transfer of Care Note  Patient: Renee Powell  Procedure(s) Performed: Procedure(s): COLONOSCOPY WITH PROPOFOL (N/A)  Patient Location: PACU  Anesthesia Type:General  Level of Consciousness: awake  Airway & Oxygen Therapy: Patient Spontanous Breathing and Patient connected to nasal cannula oxygen  Post-op Assessment: Report given to RN and Post -op Vital signs reviewed and stable  Post vital signs: Reviewed  Last Vitals:  Vitals:   05/06/17 0001 05/06/17 0719  BP: 137/61 (!) 109/50  Pulse: 87 72  Resp: 18   Temp: 36.8 C 36.7 C  SpO2: 97% 90%    Last Pain:  Vitals:   05/06/17 0719  TempSrc: Oral  PainSc:          Complications: No apparent anesthesia complications

## 2017-05-06 NOTE — Anesthesia Postprocedure Evaluation (Signed)
Anesthesia Post Note  Patient: Renee Powell  Procedure(s) Performed: Procedure(s) (LRB): COLONOSCOPY WITH PROPOFOL (N/A)  Patient location during evaluation: PACU Anesthesia Type: General Level of consciousness: awake Pain management: pain level controlled Vital Signs Assessment: post-procedure vital signs reviewed and stable Respiratory status: spontaneous breathing Cardiovascular status: stable Anesthetic complications: no     Last Vitals:  Vitals:   05/06/17 0719 05/06/17 1309  BP: (!) 109/50 (!) 101/50  Pulse: 72 76  Resp:  (!) 21  Temp: 36.7 C 37.1 C  SpO2: 90% 95%    Last Pain:  Vitals:   05/06/17 1309  TempSrc: Tympanic  PainSc:                  VAN STAVEREN,[name]

## 2017-05-06 NOTE — Progress Notes (Signed)
Pt consumed approximately 1550ml of bowel prep. Pt refuses to take any more. Educated patient on the importance of completing the liquid but still refuses to complete prep.

## 2017-05-06 NOTE — H&P (Signed)
  [name] MD [address], [place],  [address] Phone: [phone] Fax : [phone]  Primary Care Physician:  Jerrol Banana., MD Primary Gastroenterologist:  Dr. [name]   Pre-Procedure History & Physical: HPI:  Renee Powell is a 83 y.o. female is here for an colonoscopy.   Past Medical History:  Diagnosis Date  . Breast cancer [facility]) 2002   right lumpectomy with radiation  . Cancer [facility]) 2002   breast    Past Surgical History:  Procedure Laterality Date  . BREAST SURGERY Right 2002   lumpectomy    Prior to Admission medications   Medication Sig Start Date End Date Taking? Authorizing Provider  SYNTHROID 75 MCG tablet Take 75 mcg by mouth daily. 01/24/17  Yes [provider]    Allergies as of 05/03/2017  . (No Known Allergies)    Family History  Problem Relation Age of Onset  . Cancer Father        lung cancer  . Cancer Maternal Uncle   . Cancer Paternal Aunt        Breast  . Cancer Paternal Uncle        Kidney  . Cancer Paternal Uncle        Lung  . Heart attack Mother   . Cancer Paternal Grandmother        Breast  . Breast cancer Maternal Aunt 60  . Breast cancer Maternal Grandmother 60    Social History   Social History  . Marital status: Married    Spouse name: N/A  . Number of children: N/A  . Years of education: N/A   Occupational History  . Not on file.   Social History Main Topics  . Smoking status: Never Smoker  . Smokeless tobacco: Never Used  . Alcohol use No  . Drug use: No  . Sexual activity: Not on file   Other Topics Concern  . Not on file   Social History Narrative  . No narrative on file    Review of Systems: See HPI, otherwise negative ROS  Physical Exam: BP (!) 109/50   Pulse 72   Temp 98 F (36.7 C) (Oral)   Resp 18   Ht 5\' 5"  (1.651 m)   Wt 147 lb 9.6 oz (67 kg)   SpO2 90%   BMI 24.56 kg/m  General:   Alert,  pleasant and cooperative in NAD Head:  Normocephalic  and atraumatic. Neck:  Supple; no masses or thyromegaly. Lungs:  Clear throughout to auscultation.    Heart:  Regular rate and rhythm. Abdomen:  Soft, nontender and nondistended. Normal bowel sounds, without guarding, and without rebound.   Neurologic:  Alert and  oriented x4;  grossly normal neurologically.  Impression/Plan: Renee Powell is here for an colonoscopy to be performed for diarrhea   Risks, benefits, limitations, and alternatives regarding  colonoscopy have been reviewed with the patient.  Questions have been answered.  All parties agreeable.   [name], MD  05/06/2017, 12:18 PM

## 2017-05-06 NOTE — Progress Notes (Signed)
[facility] NAME: Renee Powell    MR#:  [id number]  DATE OF BIRTH:  28-Sep-1933  SUBJECTIVE:   Came in after having repeated increasing number of diarrheal stools. Mild nausea. Patient continued to have several bowel movements lose throughout the night She does not like the current [employer] Son in the room. Did finish half of the laxative liquids gallon for her colonoscopy.  REVIEW OF SYSTEMS:   Review of Systems  Constitutional: Negative for chills, fever and weight loss.  HENT: Negative for ear discharge, ear pain and nosebleeds.   Eyes: Negative for blurred vision, pain and discharge.  Respiratory: Negative for sputum production, shortness of breath, wheezing and stridor.   Cardiovascular: Negative for chest pain, palpitations, orthopnea and PND.  Gastrointestinal: Positive for diarrhea. Negative for abdominal pain, nausea and vomiting.  Genitourinary: Negative for frequency and urgency.  Musculoskeletal: Negative for back pain and joint pain.  Neurological: Negative for sensory change, speech change, focal weakness and weakness.  Psychiatric/Behavioral: Negative for depression and hallucinations. The patient is not nervous/anxious.    Tolerating Diet:yes Tolerating PT: ambulatory  DRUG ALLERGIES:   Allergies  Allergen Reactions  . Latex     VITALS:  Blood pressure 126/63, pulse 76, temperature 98.8 F (37.1 C), temperature source Tympanic, resp. rate (!) 21, height 5\' 5"  (1.651 m), weight 67 kg (147 lb 9.6 oz), SpO2 95 %.  PHYSICAL EXAMINATION:   Physical Exam  GENERAL:  83 y.o.-year-old patient lying in the bed with no acute distress.  EYES: Pupils equal, round, reactive to light and accommodation. No scleral icterus. Extraocular muscles intact.  HEENT: Head atraumatic, normocephalic. Oropharynx and nasopharynx clear.  NECK:  Supple, no jugular venous distention. No thyroid enlargement, no tenderness.   LUNGS: Normal breath sounds bilaterally, no wheezing, rales, rhonchi. No use of accessory muscles of respiration.  CARDIOVASCULAR: S1, S2 normal. No murmurs, rubs, or gallops.  ABDOMEN: Soft, nontender, nondistended. Bowel sounds present. No organomegaly or mass.  EXTREMITIES: No cyanosis, clubbing or edema b/l.    NEUROLOGIC: Cranial nerves II through XII are intact. No focal Motor or sensory deficits b/l.   PSYCHIATRIC:  patient is alert and oriented x 3.  SKIN: No obvious rash, lesion, or ulcer.   LABORATORY PANEL:  CBC  Recent Labs Lab 05/06/17 1012  WBC 7.7  HGB 12.4  HCT 36.4  PLT 214    Chemistries   Recent Labs Lab 05/04/17 0313 05/05/17 1227 05/06/17 1012  NA 137  --  139  K 3.1* 3.3* 3.0*  CL 107  --  108  CO2 23  --  24  GLUCOSE 100*  --  96  BUN 14  --  15  CREATININE 0.84  --  0.74  CALCIUM 8.3*  --  8.4*  MG 1.6* 2.0  --   AST 18  --   --   ALT 13*  --   --   ALKPHOS 61  --   --   BILITOT 1.1  --   --    Cardiac Enzymes No results for input(s): TROPONINI in the last 168 hours. RADIOLOGY:  No results found. ASSESSMENT AND PLAN:   Renee Powell  is a 83 y.o. 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 Patient is reporting generali abdominal pain but denies any blood in her stool. No recent  travel .   # Acute gastroenteritis appear viral vs food related BRAT diet per GI Stool for C. Difficile toxin is negative, GI panel is negative received IV fluids---encouraging oral fluids Antiemetics, supportive treatment Pepcid Seen by GI----colonoscopy to rule out microscopic colitis, shows some polyp, no acute changes.  on when necessary Imodium, GI suggested to start scheduled imodium.  #Dehydration secondary to acute gastroenteritis Received IV fluids  #Hypokalemia Replete and recheck.   #hypothyroidism continue  Synthroid   #Hyperlipidemia patient is not on any medications  -start po statins  GI prophylaxis Pepcid DVT prophylaxis Lovenox subcutaneous   Case discussed with Care Management/Social Worker. Management plans discussed with the patient, family and they are in agreement.  CODE STATUS: FULL  DVT Prophylaxis: lovenox  TOTAL TIME TAKING CARE OF THIS PATIENT: 30* minutes.  >50% time spent on counselling and coordination of care  POSSIBLE D/C IN *1-2* DAYS, DEPENDING ON CLINICAL CONDITION.  Note: This dictation was prepared with Dragon dictation along with smaller phrase technology. Any transcriptional errors that result from this process are unintentional.  Vaughan Basta M.D on 05/06/2017 at 2:42 PM  Between 7am to 6pm - Pager - [phone]  After 6pm go to [email or web address] - password [email or web address] [place] [facility]  Office  [phone]  CC: Primary care physician; Renee Powell., MD

## 2017-05-07 ENCOUNTER — Telehealth: Payer: Self-pay | Admitting: Family Medicine

## 2017-05-07 LAB — POTASSIUM: Potassium: 3.3 mmol/L — ABNORMAL LOW (ref 3.5–5.1)

## 2017-05-07 MED ORDER — POTASSIUM CHLORIDE ER 10 MEQ PO TBCR: 10.0000 meq | EXTENDED_RELEASE_TABLET | Freq: Two times a day (BID) | ORAL | 0 refills | Status: DC

## 2017-05-07 MED ORDER — POTASSIUM CHLORIDE IN NACL 40-0.9 MEQ/L-% IV SOLN
INTRAVENOUS | Status: AC
  Administered 2017-05-07: 100 mL/h via INTRAVENOUS
  Filled 2017-05-07: qty 1000

## 2017-05-07 MED ORDER — POTASSIUM CHLORIDE CRYS ER 20 MEQ PO TBCR
40.0000 meq | EXTENDED_RELEASE_TABLET | Freq: Two times a day (BID) | ORAL | Status: DC
  Administered 2017-05-07: 40 meq via ORAL
  Filled 2017-05-07: qty 2

## 2017-05-07 MED ORDER — LOPERAMIDE HCL 2 MG PO CAPS: 2.0000 mg | ORAL_CAPSULE | Freq: Four times a day (QID) | ORAL | 0 refills | Status: DC | PRN

## 2017-05-07 MED ORDER — ATORVASTATIN CALCIUM 40 MG PO TABS: 40.0000 mg | ORAL_TABLET | Freq: Every day | ORAL | 0 refills | Status: DC

## 2017-05-07 MED ORDER — FAMOTIDINE 20 MG PO TABS: 20.0000 mg | ORAL_TABLET | Freq: Every day | ORAL | 0 refills | Status: DC

## 2017-05-07 NOTE — Progress Notes (Signed)
Patient was giving discharge instructions and is waiting for her son to come and get her. Patient have no complaints or SS of distress.

## 2017-05-07 NOTE — Telephone Encounter (Signed)
Pt is being discharged today from [facility] for diarrhea.  I have scheduled a hospital follow up appointment/MW

## 2017-05-07 NOTE — Progress Notes (Signed)
Dr. Anselm Jungling updated on patient and patient's report of thyroid tumors and swallowing concerns

## 2017-05-07 NOTE — Discharge Summary (Signed)
Renee Powell at [place] NAME: Renee Powell    MR#:  [id number]  DATE OF BIRTH:  [birth year]  DATE OF ADMISSION:  05/03/2017 ADMITTING PHYSICIAN: Nicholes Mango, MD  DATE OF DISCHARGE: 05/07/2017  PRIMARY CARE PHYSICIAN: Jerrol Banana., MD    ADMISSION DIAGNOSIS:  Diarrhea, unspecified type [R19.7]  DISCHARGE DIAGNOSIS:  Active Problems:   Acute gastroenteritis   SECONDARY DIAGNOSIS:   Past Medical History:  Diagnosis Date  . Breast cancer [facility]) 2002   right lumpectomy with radiation  . Cancer [facility]) 2002   breast    HOSPITAL COURSE:   Renee Powell a 83 y.o. femalewith a known history of hypothyroidism, hyperlipidemia, breast cancer is presenting to the ED with a chef complaint of intractable diarrhea starting from last Tuesday. Patient is reporting generali abdominal pain but denies any blood in her stool. No recent travel .   # Acute gastroenteritis appear viral vs food related BRAT diet per GI Stool for C. Difficile toxin is negative, GI panel is negative received IV fluids---encouraging oral fluids Antiemetics, supportive treatment Pepcid Seen by GI----colonoscopy to rule out microscopic colitis, shows some polyp, no acute changes.  on when necessary Imodium, GI suggested to start scheduled imodium.  Had good response, decreasing stool frequency and now soft stool, instead of liquids.   Will arrange for bedside commode.  #Dehydration secondary to acute gastroenteritis Received IV fluids  #Hypokalemia Replete and recheck.   #hypothyroidism continue Synthroid   #Hyperlipidemia patient is not on any medications  -start po statins  DISCHARGE CONDITIONS:   Stable.  CONSULTS OBTAINED:    DRUG ALLERGIES:   Allergies  Allergen Reactions  . Latex     DISCHARGE MEDICATIONS:   Current Discharge Medication List    START taking these medications   Details  atorvastatin (LIPITOR) 40 MG tablet  Take 1 tablet (40 mg total) by mouth daily at 6 PM. Qty: 30 tablet, Refills: 0    famotidine (PEPCID) 20 MG tablet Take 1 tablet (20 mg total) by mouth daily. Qty: 30 tablet, Refills: 0    loperamide (IMODIUM) 2 MG capsule Take 1 capsule (2 mg total) by mouth every 6 (six) hours as needed for diarrhea or loose stools. Qty: 30 capsule, Refills: 0    potassium chloride (K-DUR) 10 MEQ tablet Take 1 tablet (10 mEq total) by mouth 2 (two) times daily. Qty: 8 tablet, Refills: 0      CONTINUE these medications which have NOT CHANGED   Details  SYNTHROID 75 MCG tablet Take 75 mcg by mouth daily. Refills: 11         DISCHARGE INSTRUCTIONS:    Follow with PMD in 1 week, need to check potassium.  If you experience worsening of your admission symptoms, develop shortness of breath, life threatening emergency, suicidal or homicidal thoughts you must seek medical attention immediately by calling 911 or calling your MD immediately  if symptoms less severe.  You Must read complete instructions/literature along with all the possible adverse reactions/side effects for all the Medicines you take and that have been prescribed to you. Take any new Medicines after you have completely understood and accept all the possible adverse reactions/side effects.   Please note  You were cared for by a hospitalist during your hospital stay. If you have any questions about your discharge medications or the care you received while you were in the hospital after you are discharged, you can call the unit and asked  to speak with the hospitalist on call if the hospitalist that took care of you is not available. Once you are discharged, your primary care physician will handle any further medical issues. Please note that NO REFILLS for any discharge medications will be authorized once you are discharged, as it is imperative that you return to your primary care physician (or establish a relationship with a primary care  physician if you do not have one) for your aftercare needs so that they can reassess your need for medications and monitor your lab values.    Today   CHIEF COMPLAINT:   Chief Complaint  Patient presents with  . Abdominal Pain  . Diarrhea    HISTORY OF PRESENT ILLNESS:  Renee Powell  is a 83 y.o. 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 Patient is reporting generali abdominal pain but denies any blood in her stool. No recent travel . Had one episode of vomiting today Patient is unable to tolerate any diet as she is nauseous, feeling weak and tired. Denies any fever. No sick contacts  VITAL SIGNS:  Blood pressure (!) 105/56, pulse 80, temperature 98.2 F (36.8 C), temperature source Oral, resp. rate 17, height 5\' 5"  (1.651 m), weight 67 kg (147 lb 9.6 oz), SpO2 90 %.  I/O:   Intake/Output Summary (Last 24 hours) at 05/07/17 1514 Last data filed at 05/07/17 1200  Gross per 24 hour  Intake              840 ml  Output                0 ml  Net              840 ml    PHYSICAL EXAMINATION:   GENERAL:  83 y.o.-year-old patient lying in the bed with no acute distress.  EYES: Pupils equal, round, reactive to light and accommodation. No scleral icterus. Extraocular muscles intact.  HEENT: Head atraumatic, normocephalic. Oropharynx and nasopharynx clear.  NECK:  Supple, no jugular venous distention. No thyroid enlargement, no tenderness.  LUNGS: Normal breath sounds bilaterally, no wheezing, rales, rhonchi. No use of accessory muscles of respiration.  CARDIOVASCULAR: S1, S2 normal. No murmurs, rubs, or gallops.  ABDOMEN: Soft, nontender, nondistended. Bowel sounds present. No organomegaly or mass.  EXTREMITIES: No cyanosis, clubbing or edema b/l.    NEUROLOGIC: Cranial nerves II through XII are intact. No focal Motor or sensory deficits b/l.    PSYCHIATRIC:  patient is alert and oriented x 3.  SKIN: No obvious rash, lesion, or ulcer.   DATA REVIEW:   CBC  Recent Labs Lab 05/06/17 1012  WBC 7.7  HGB 12.4  HCT 36.4  PLT 214    Chemistries   Recent Labs Lab 05/04/17 0313 05/05/17 1227 05/06/17 1012  NA 137  --  139  K 3.1* 3.3* 3.0*  CL 107  --  108  CO2 23  --  24  GLUCOSE 100*  --  96  BUN 14  --  15  CREATININE 0.84  --  0.74  CALCIUM 8.3*  --  8.4*  MG 1.6* 2.0  --   AST 18  --   --   ALT 13*  --   --   ALKPHOS 61  --   --   BILITOT 1.1  --   --     Cardiac Enzymes No  results for input(s): TROPONINI in the last 168 hours.  Microbiology Results  Results for orders placed or performed during the hospital encounter of 05/03/17  Gastrointestinal Panel by PCR , Stool     Status: None   Collection Time: 05/03/17  7:46 AM  Result Value Ref Range Status   Campylobacter species NOT DETECTED NOT DETECTED Final   Plesimonas shigelloides NOT DETECTED NOT DETECTED Final   Salmonella species NOT DETECTED NOT DETECTED Final   Yersinia enterocolitica NOT DETECTED NOT DETECTED Final   Vibrio species NOT DETECTED NOT DETECTED Final   Vibrio cholerae NOT DETECTED NOT DETECTED Final   Enteroaggregative E coli (EAEC) NOT DETECTED NOT DETECTED Final   Enteropathogenic E coli (EPEC) NOT DETECTED NOT DETECTED Final   Enterotoxigenic E coli (ETEC) NOT DETECTED NOT DETECTED Final   Shiga like toxin producing E coli (STEC) NOT DETECTED NOT DETECTED Final   Shigella/Enteroinvasive E coli (EIEC) NOT DETECTED NOT DETECTED Final   Cryptosporidium NOT DETECTED NOT DETECTED Final   Cyclospora cayetanensis NOT DETECTED NOT DETECTED Final   Entamoeba histolytica NOT DETECTED NOT DETECTED Final   Giardia lamblia NOT DETECTED NOT DETECTED Final   Adenovirus F40/41 NOT DETECTED NOT DETECTED Final   Astrovirus NOT DETECTED NOT DETECTED Final   Norovirus GI/GII NOT DETECTED NOT DETECTED Final   Rotavirus A NOT DETECTED NOT  DETECTED Final   Sapovirus (I, II, IV, and V) NOT DETECTED NOT DETECTED Final  C difficile quick scan w PCR reflex     Status: None   Collection Time: 05/03/17  7:46 AM  Result Value Ref Range Status   C Diff antigen NEGATIVE NEGATIVE Final   C Diff toxin NEGATIVE NEGATIVE Final   C Diff interpretation No C. difficile detected.  Final    RADIOLOGY:  No results found.  EKG:  No orders found for this or any previous visit.    Management plans discussed with the patient, family and they are in agreement.  CODE STATUS:     Code Status Orders        Start     Ordered   05/03/17 1400  Full code  Continuous     05/03/17 1359    Code Status History    Date Active Date Inactive Code Status Order ID Comments User Context   This patient has a current code status but no historical code status.    Advance Directive Documentation     Most Recent Value  Type of Advance Directive  Healthcare Power of Attorney, Living will  Pre-existing out of facility DNR order (yellow form or pink MOST form)  -  "MOST" Form in Place?  -      TOTAL TIME TAKING CARE OF THIS PATIENT: 35 minutes.    Vaughan Basta M.D on 05/07/2017 at 3:14 PM  Between 7am to 6pm - Pager - [phone]  After 6pm go to [email or web address] - password [email or web address] [facility]  Office  [phone]  CC: Primary care physician; Jerrol Banana., MD   Note: This dictation was prepared with Dragon dictation along with smaller phrase technology. Any transcriptional errors that result from this process are unintentional.

## 2017-05-07 NOTE — Progress Notes (Signed)
Pt refused taking Potassium oral replacement. Writer explained to pt that her last Potassium level was low, hence replacement was ordered. Pt continued to refuse despite education and stated "I don't want to take anything that will make me go to the bathroom". Pt asked for orange juice instead.

## 2017-05-07 NOTE — Progress Notes (Signed)
Patient could not swallow potassium tablets broken in half.  Pills were dissolved in water.  Patient did choke on one half tablet prior to dissolving.  At this time she states that she forgot to inform us that she has recurrent tumors on her thyroid and that she is scheduled for an ultrasound of her thyroid next week.  Staff nurse will be made aware.  BBR

## 2017-05-08 LAB — SURGICAL PATHOLOGY

## 2017-05-08 NOTE — Telephone Encounter (Signed)
[email or web address] 

## 2017-05-13 ENCOUNTER — Encounter: Payer: Self-pay | Admitting: Gastroenterology

## 2017-05-13 ENCOUNTER — Other Ambulatory Visit
Admission: RE | Admit: 2017-05-13 | Discharge: 2017-05-13 | Disposition: A | Discharge status: Home / Self Care | Payer: Medicare Other | Source: Ambulatory Visit | Attending: Gastroenterology | Admitting: Gastroenterology

## 2017-05-13 ENCOUNTER — Ambulatory Visit (INDEPENDENT_AMBULATORY_CARE_PROVIDER_SITE_OTHER): Payer: Medicare Other | Admitting: Gastroenterology

## 2017-05-13 VITALS — BP 121/73 | HR 101 | Temp 98.3°F | Ht 65.0 in | Wt 161.2 lb

## 2017-05-13 DIAGNOSIS — K529 Noninfective gastroenteritis and colitis, unspecified: Secondary | ICD-10-CM | POA: Diagnosis not present

## 2017-05-13 DIAGNOSIS — R1310 Dysphagia, unspecified: Secondary | ICD-10-CM | POA: Diagnosis not present

## 2017-05-13 LAB — CBC WITH DIFFERENTIAL/PLATELET
BASOS PCT: 1 %
Basophils Absolute: 0.1 10*3/uL (ref 0–0.1)
EOS ABS: 0.2 10*3/uL (ref 0–0.7)
Eosinophils Relative: 2 %
HEMATOCRIT: 34.8 % — AB (ref 35.0–47.0)
HEMOGLOBIN: 11.6 g/dL — AB (ref 12.0–16.0)
LYMPHS ABS: 1 10*3/uL (ref 1.0–3.6)
Lymphocytes Relative: 11 %
MCH: 28.7 pg (ref 26.0–34.0)
MCHC: 33.3 g/dL (ref 32.0–36.0)
MCV: 86.1 fL (ref 80.0–100.0)
MONOS PCT: 11 %
Monocytes Absolute: 1 10*3/uL — ABNORMAL HIGH (ref 0.2–0.9)
NEUTROS ABS: 6.7 10*3/uL — AB (ref 1.4–6.5)
NEUTROS PCT: 75 %
Platelets: 223 10*3/uL (ref 150–440)
RBC: 4.05 MIL/uL (ref 3.80–5.20)
RDW: 14.1 % (ref 11.5–14.5)
WBC: 8.9 10*3/uL (ref 3.6–11.0)

## 2017-05-13 LAB — COMPREHENSIVE METABOLIC PANEL
ALBUMIN: 2.7 g/dL — AB (ref 3.5–5.0)
ALK PHOS: 56 U/L (ref 38–126)
ALT: 25 U/L (ref 14–54)
AST: 26 U/L (ref 15–41)
Anion gap: 7 (ref 5–15)
BILIRUBIN TOTAL: 0.5 mg/dL (ref 0.3–1.2)
BUN: 13 mg/dL (ref 6–20)
CALCIUM: 8.2 mg/dL — AB (ref 8.9–10.3)
CO2: 27 mmol/L (ref 22–32)
CREATININE: 0.88 mg/dL (ref 0.44–1.00)
Chloride: 104 mmol/L (ref 101–111)
GFR calc Af Amer: >60 mL/min (ref >60)
GFR calc non Af Amer: 60 mL/min — ABNORMAL LOW (ref >60)
GLUCOSE: 133 mg/dL — AB (ref 65–99)
Potassium: 3.1 mmol/L — ABNORMAL LOW (ref 3.5–5.1)
SODIUM: 138 mmol/L (ref 135–145)
TOTAL PROTEIN: 5.9 g/dL — AB (ref 6.5–8.1)

## 2017-05-13 LAB — C-REACTIVE PROTEIN: CRP: 12.9 mg/dL — AB (ref <1.0)

## 2017-05-13 NOTE — Addendum Note (Signed)
Addended by: Peggye Ley on: 05/13/2017 04:13 PM   Modules accepted: Orders

## 2017-05-13 NOTE — Progress Notes (Signed)
[name] MD, MRCP(U.K) [address]  [place]  [place], [place] [address]  Main: [phone]  Fax: [phone]   Primary Care Physician: Jerrol Banana., MD  Primary Gastroenterologist:  Dr. [name]   Chief Complaint  Patient presents with  . Hospital Follow up: Results    HPI: CARRY WEESNER is a 83 y.o. female    Summary of history :  She was seen recently when admitted to the hospital on 05/03/17 for diarrhea of 1 week duration , non bloody , stool tests negative for infection.   I performed a colonoscopy on 05/06/17 and I noted moderate inflammation throughout the colon , prep was poor, she had a 12-15 mm sigmoid colon polyp that I didn't excise due to the poor prep . I took multiple biopsies -biopsies of the terminal ileum showed mild active enteritis. Bx of right colon ,transverse colon ,sigmoid, rectum  showed mild to moderate active colitis. There was also features of chronicity seen as well   Interval history   05/05/2017-  05/13/2017  Since discharge - very weak - presently has 4-5 bowel movements , clear watery in consistency ,not formed , no cramping . At the time of admission she was having a bowel movement every 5 minutes. No chronic NSAID use. Not a smoker.    She absolutely denies any prior diarrhea. She is on imodium once a day - helps when she is taking it. Presently the diarrhea is going on for 2 weeks.   She says she has had issues with swallowing since she says the thyroid nodules have been "growing ", ongoing rather worse that she realized recently. More difficult to swallow solids > liquids. No weight loss. No choking or coughing when she swallows.    Current Outpatient Prescriptions  Medication Sig Dispense Refill  . atorvastatin (LIPITOR) 40 MG tablet Take 1 tablet (40 mg total) by mouth daily at 6 PM. 30 tablet 0  . famotidine (PEPCID) 20 MG tablet Take 1 tablet (20 mg total) by mouth daily. 30 tablet 0  . loperamide (IMODIUM) 2 MG  capsule Take 1 capsule (2 mg total) by mouth every 6 (six) hours as needed for diarrhea or loose stools. 30 capsule 0  . SYNTHROID 75 MCG tablet Take 75 mcg by mouth daily.  11  . potassium chloride (K-DUR) 10 MEQ tablet Take 1 tablet (10 mEq total) by mouth 2 (two) times daily. (Patient not taking: Reported on 05/13/2017) 8 tablet 0   No current facility-administered medications for this visit.     Allergies as of 05/13/2017 - Review Complete 05/13/2017  Allergen Reaction Noted  . Latex  05/04/2017    ROS:  General: Negative for anorexia, weight loss, fever, chills, fatigue, weakness. ENT: Negative for hoarseness, difficulty swallowing , nasal congestion. CV: Negative for chest pain, angina, palpitations, dyspnea on exertion, peripheral edema.  Respiratory: Negative for dyspnea at rest, dyspnea on exertion, cough, sputum, wheezing.  GI: See history of present illness. GU:  Negative for dysuria, hematuria, urinary incontinence, urinary frequency, nocturnal urination.  Endo: Negative for unusual weight change.    Physical Examination:   BP 121/73   Pulse (!) 101   Temp 98.3 F (36.8 C) (Oral)   Ht 5\' 5"  (1.651 m)   Wt 161 lb 3.2 oz (73.1 kg)   BMI 26.83 kg/m   General: Well-nourished, well-developed in no acute distress.  Eyes: No icterus. Conjunctivae pink. Mouth: Oropharyngeal mucosa moist and pink , no lesions  erythema or exudate. Lungs: Clear to auscultation bilaterally. Non-labored. Heart: Regular rate and rhythm, no murmurs rubs or gallops.  Abdomen: Bowel sounds are normal, nontender, nondistended, no hepatosplenomegaly or masses, no abdominal bruits or hernia , no rebound or guarding.   Extremities: No lower extremity edema. No clubbing or deformities. Neuro: Alert and oriented x 3.  Grossly intact. Skin: Warm and dry, no jaundice.   Psych: Alert and cooperative, normal mood and affect.   Imaging Studies: Ct Abdomen Pelvis W Contrast  Result Date:  05/03/2017 CLINICAL DATA:  Diarrhea. EXAM: CT ABDOMEN AND PELVIS WITH CONTRAST TECHNIQUE: Multidetector CT imaging of the abdomen and pelvis was performed using the standard protocol following bolus administration of intravenous contrast. CONTRAST:  38mL ISOVUE-300 IOPAMIDOL (ISOVUE-300) INJECTION 61% COMPARISON:  None. FINDINGS: Lower chest:  Unremarkable. Hepatobiliary: Tiny hypodensities in the liver parenchyma are too small to characterize but likely cysts. 13 mm noncalcified gallstone evident. No intrahepatic or extrahepatic biliary dilation. Pancreas: No focal mass lesion. No dilatation of the main duct. No intraparenchymal cyst. No peripancreatic edema. Spleen: No splenomegaly. No focal mass lesion. Adrenals/Urinary Tract: No adrenal nodule or mass. Central sinus cysts noted in both kidneys. No evidence for hydroureter. The urinary bladder appears normal for the degree of distention. Stomach/Bowel: Small to moderate hiatal hernia. Extremely large duodenal diverticulum evident. Duodenum is normally positioned as is the ligament of Treitz. No small bowel wall thickening. No small bowel dilatation. Numerous small bowel diverticuli are identified. The terminal ileum is normal. The appendix is normal. Diverticular changes are noted in the left colon without evidence of diverticulitis. Vascular/Lymphatic: There is abdominal aortic atherosclerosis without aneurysm. Portal vein and superior mesenteric vein are patent. Celiac axis and SMA are opacified. There is no gastrohepatic or hepatoduodenal ligament lymphadenopathy. No intraperitoneal or retroperitoneal lymphadenopathy. No pelvic sidewall lymphadenopathy. Reproductive: The uterus has normal CT imaging appearance. There is no adnexal mass. Other: No intraperitoneal free fluid. Musculoskeletal: Pelvic floor laxity evident. Bone windows reveal no worrisome lytic or sclerotic osseous lesions. IMPRESSION: 1. No acute findings in the abdomen or pelvis. No small or  large bowel wall thickening in this patient with a reported history of diarrhea. 2. 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 No features on today's study to suggest diverticulitis. 3.  Aortic Atherosclerois (ICD10-170.0) 4. Cholelithiasis. Electronically Signed   By: Misty Stanley M.D.   On: 05/03/2017 09:32   Dg Abdomen Acute W/chest  Result Date: 05/01/2017 CLINICAL DATA:  Abdominal distention, diarrhea EXAM: DG ABDOMEN ACUTE W/ 1V CHEST COMPARISON:  None. FINDINGS: Lungs are clear.  No pleural effusion or pneumothorax. The heart is normal in size. Small hiatal hernia. Nonobstructive bowel gas pattern. No evidence of free air under the diaphragm on the upright view. Visualized osseous structures are within normal limits. IMPRESSION: No evidence of acute cardiopulmonary disease. No evidence of small bowel obstruction or free air. Small hiatal hernia. Electronically Signed   By: Julian Hy M.D.   On: 05/01/2017 20:20    Assessment and Plan:   LILLIAH PRIEGO is a 83 y.o. y/o female here to follow up to her hospital visit when she was admitted with acute diarrhea. Colonoscopy shows enteritis and pan colitis- mild to moderate. Biopsies do show some chronic changes which is suggestive of inflammatory bowel disease. Clinically she is improving while not on any treatment and is behaving similar to an infectious colitis. She is very reluctant to start any medications at this time hence will give her  the  Benefit of the doubt and hold off any new meds for 2 weeks.If symptoms resolve at next visit then will closely watch , she does have a pre cancerous sigmoid polyp that needs to be taken out over the next few months. She also mentioned today that she has some dysphagia, she is reluctant to undergo an EGD hence will get a barium swallow to evaluate further.    1. CBC,CRP,CMP 2.  Barium swallow with tablet.    Dr [name]  MD,MRCP [facility]) Follow up in 2 weeks

## 2017-05-14 ENCOUNTER — Ambulatory Visit (INDEPENDENT_AMBULATORY_CARE_PROVIDER_SITE_OTHER): Payer: Medicare Other | Admitting: Family Medicine

## 2017-05-14 ENCOUNTER — Encounter: Payer: Self-pay | Admitting: Family Medicine

## 2017-05-14 VITALS — BP 104/60 | HR 70 | Temp 97.8°F | Resp 16 | Wt 163.0 lb

## 2017-05-14 DIAGNOSIS — E876 Hypokalemia: Secondary | ICD-10-CM | POA: Diagnosis not present

## 2017-05-14 DIAGNOSIS — K529 Noninfective gastroenteritis and colitis, unspecified: Secondary | ICD-10-CM

## 2017-05-14 MED ORDER — POTASSIUM CHLORIDE ER 10 MEQ PO TBCR: 10.0000 meq | EXTENDED_RELEASE_TABLET | Freq: Two times a day (BID) | ORAL | 0 refills | Status: DC

## 2017-05-14 NOTE — Progress Notes (Signed)
Patient: Renee Powell Female    DOB: 1933/12/05   83 y.o.   MRN: [id number] Visit Date: 05/14/2017  Today's Provider: Wilhemena Durie, MD   Chief Complaint  Patient presents with  . Hospitalization Follow-up   Subjective:    HPI  Hospital stay 05/03/17- 05/07/17  Admit diagnoses- diarrhea Discharge diagnoses- acute gastroenteritis  C.Diff negative, stool studies negative.  Per GI colonoscopy to rule out microscopic colitis (05/06/17) GI suggested scheduled imodium  Med changes. Started Lipitor, Pepcid and imodium  Pt reports that she still feels weak, having diarrhea and having trouble swallowing. She is seeing GI and they are possible going to do a endoscopy in 2 weeks if her swallowing study is ok. Pt also has known thyroid nodules that she is seeing Dr. Carmela Rima about.      Allergies  Allergen Reactions  . Latex      Current Outpatient Prescriptions:  .  atorvastatin (LIPITOR) 40 MG tablet, Take 1 tablet (40 mg total) by mouth daily at 6 PM., Disp: 30 tablet, Rfl: 0 .  famotidine (PEPCID) 20 MG tablet, Take 1 tablet (20 mg total) by mouth daily., Disp: 30 tablet, Rfl: 0 .  loperamide (IMODIUM) 2 MG capsule, Take 1 capsule (2 mg total) by mouth every 6 (six) hours as needed for diarrhea or loose stools., Disp: 30 capsule, Rfl: 0 .  SYNTHROID 75 MCG tablet, Take 75 mcg by mouth daily., Disp: , Rfl: 11 .  potassium chloride (K-DUR) 10 MEQ tablet, Take 1 tablet (10 mEq total) by mouth 2 (two) times daily. (Patient not taking: Reported on 05/13/2017), Disp: 8 tablet, Rfl: 0  Review of Systems  Constitutional: Positive for fatigue.  HENT: Negative.   Eyes: Negative.   Respiratory: Positive for choking.   Cardiovascular: Negative.   Gastrointestinal: Positive for diarrhea.  Endocrine: Negative.   Genitourinary: Negative.   Musculoskeletal: Negative.   Skin: Negative.   Allergic/Immunologic: Negative.   Neurological: Positive for weakness.  Hematological:  Negative.   Psychiatric/Behavioral: Negative.     Social History  Substance Use Topics  . Smoking status: Never Smoker  . Smokeless tobacco: Never Used  . Alcohol use No   Objective:   BP 104/60 (BP Location: Left Arm, Patient Position: Sitting, Cuff Size: Normal)   Pulse 70   Temp 97.8 F (36.6 C) (Oral)   Resp 16   Wt 163 lb (73.9 kg)   BMI 27.12 kg/m  Vitals:   05/14/17 1349  BP: 104/60  Pulse: 70  Resp: 16  Temp: 97.8 F (36.6 C)  TempSrc: Oral  Weight: 163 lb (73.9 kg)     Physical Exam  Constitutional: She is oriented to person, place, and time. She appears well-developed and well-nourished.  HENT:  Head: Normocephalic and atraumatic.  Eyes: Conjunctivae are normal. No scleral icterus.  Neck: No thyromegaly present.  Cardiovascular: Normal rate, regular rhythm and normal heart sounds.   Pulmonary/Chest: Effort normal and breath sounds normal.  Abdominal: Soft.  Neurological: She is alert and oriented to person, place, and time.  Skin: Skin is warm and dry.  Psychiatric: She has a normal mood and affect. Her behavior is normal. Judgment and thought content normal.        Assessment & Plan:     1. Acute gastroenteritis   2. Hypokalemia  - potassium chloride (K-DUR) 10 MEQ tablet; Take 1 tablet (10 mEq total) by mouth 2 (two) times daily.  Dispense: 60 tablet; Refill: 0 -  Potassium 3.Possible Ulcerative Colitis Pt has GI f/u.  I have done the exam and reviewed the chart and it is accurate to the best of my knowledge. Development worker, community has been used and  any errors in dictation or transcription are unintentional. Miguel Aschoff M.D. [place], MD  [facility]

## 2017-05-15 ENCOUNTER — Telehealth: Payer: Self-pay

## 2017-05-15 NOTE — Telephone Encounter (Signed)
Advised patient she's scheduled for Barium Swallow Friday, 9/28 @ 1230pm [facility] per [facility]

## 2017-05-19 LAB — POTASSIUM: Potassium: 4.3 mmol/L (ref 3.5–5.3)

## 2017-05-20 ENCOUNTER — Ambulatory Visit (INDEPENDENT_AMBULATORY_CARE_PROVIDER_SITE_OTHER): Payer: Medicare Other | Admitting: Family Medicine

## 2017-05-20 VITALS — BP 160/80 | HR 72 | Temp 97.7°F | Resp 14 | Wt 160.0 lb

## 2017-05-20 DIAGNOSIS — K51919 Ulcerative colitis, unspecified with unspecified complications: Secondary | ICD-10-CM

## 2017-05-20 NOTE — Patient Instructions (Signed)
Stop Immodium  Start Colace  Use Tylenol for pain

## 2017-05-20 NOTE — Progress Notes (Signed)
Renee Powell  MRN: [id number] DOB: [birth year]  Subjective:  HPI   The patient is an 83 year old female who presents because of continued edema, head/neck/shoulder and back pain and night sweats.  The patient was recently hospitalized for gastroenteritis.  She was having diarrhea and put on Immodium.  She now has constipation and had stopped the Immodium 3 days ago.   The patient had for the first time pain on the left side of her head, left neck down the left arm, around to her shoulder blade and leg to her foot.  She said it lasted all night long, not doing anything and states it is all better today.  Night sweats-patient states she started having nights sweats 3-4 days ago.  She said that since coming home from the hospital she keeps chilled and the last 3 nights she has woke up with her top wet.    She still has abdominal pain, bloating and swelling.  She is able to eat but 30 minutes after she eats her stomach hurts and feels she needs to have a bowel movement but then is unable to have one.    Patient Active Problem List   Diagnosis Date Noted  . Acute gastroenteritis 05/03/2017  . HLD (hyperlipidemia) 07/04/2015  . Dermoid inclusion cyst 07/04/2015  . Disease of thyroid gland 07/04/2015  . Cancer of right female breast ([place]) 04/07/2015  . Perianal cyst 12/13/2014    Past Medical History:  Diagnosis Date  . Breast cancer [facility]) 2002   right lumpectomy with radiation  . Cancer [facility]) 2002   breast    Social History   Social History  . Marital status: Married    Spouse name: N/A  . Number of children: N/A  . Years of education: N/A   Occupational History  . Not on file.   Social History Main Topics  . Smoking status: Never Smoker  . Smokeless tobacco: Never Used  . Alcohol use No  . Drug use: No  . Sexual activity: Not on file   Other Topics Concern  . Not on file   Social History Narrative  . No narrative on file    Outpatient Encounter Prescriptions  as of 05/20/2017  Medication Sig  . famotidine (PEPCID) 20 MG tablet Take 1 tablet (20 mg total) by mouth daily.  . potassium chloride (K-DUR) 10 MEQ tablet Take 1 tablet (10 mEq total) by mouth 2 (two) times daily.  Marland Kitchen SYNTHROID 75 MCG tablet Take 75 mcg by mouth daily.  . [DISCONTINUED] atorvastatin (LIPITOR) 40 MG tablet Take 1 tablet (40 mg total) by mouth daily at 6 PM.  . [DISCONTINUED] loperamide (IMODIUM) 2 MG capsule Take 1 capsule (2 mg total) by mouth every 6 (six) hours as needed for diarrhea or loose stools.   No facility-administered encounter medications on file as of 05/20/2017.     Allergies  Allergen Reactions  . Latex     Review of Systems  Constitutional: Positive for malaise/fatigue. Negative for fever (not for sure).  Gastrointestinal: Positive for abdominal pain and constipation. Negative for diarrhea, heartburn, melena, nausea and vomiting. Blood in stool: only after straining to have a BM when she wipes it is on the tissue.  Genitourinary: Negative for dysuria, frequency, hematuria and urgency.  Musculoskeletal: Positive for back pain, falls, myalgias and neck pain. Negative for joint pain.  Neurological: Positive for weakness.    Objective:  BP (!) 160/80 (BP Location: Right Arm, Patient Position: Sitting, Cuff Size:  Normal)   Pulse 72   Temp 97.7 F (36.5 C) (Oral)   Resp 14   Wt 160 lb (72.6 kg)   BMI 26.63 kg/m   Physical Exam  Constitutional: She is well-developed, well-nourished, and in no distress.  HENT:  Head: Normocephalic and atraumatic.  Eyes: Pupils are equal, round, and reactive to light. Conjunctivae are normal.  Neck: Normal range of motion. No thyromegaly present.  Cardiovascular: Normal rate, regular rhythm and normal heart sounds.   Pulmonary/Chest: Effort normal and breath sounds normal.  Abdominal: She exhibits no distension. There is tenderness (mild lower abdominal tenderness).  Musculoskeletal: She exhibits edema (trace) and  tenderness (mild tenderness to the touch of her legs).  Skin: Skin is warm and dry.  Psychiatric: Mood, memory, affect and judgment normal.    Assessment and Plan :   1. Ulcerative colitis with complication, unspecified location [facility]) This could be ischemic but most likely UC.Sees GI next week. Spoke with Dr Vicente Males. - CBC with Differential/Platelet - COMPLETE METABOLIC PANEL WITH GFR - Sedimentation rate - C-reactive protein 2.Mild Dependent Edema vs mild third spacing More than 50% of 25 minute visit spent in counseling.  I have done the exam and reviewed the chart and it is accurate to the best of my knowledge. Development worker, community has been used and  any errors in dictation or transcription are unintentional. Miguel Aschoff M.D. [facility]

## 2017-05-25 ENCOUNTER — Telehealth: Payer: Self-pay | Admitting: Family Medicine

## 2017-05-25 NOTE — Telephone Encounter (Signed)
Then have it done here.

## 2017-05-25 NOTE — Telephone Encounter (Signed)
Pt was in last week and pt says DR. Rosanna Randy told her to have Dr. Ronnald Collum to do her lab work.  Pt said Dr. Ronnald Collum said he does not do other Dr's labs...   Pt's call back [phone]  Thanks teri

## 2017-05-25 NOTE — Telephone Encounter (Signed)
Patient advised. Patient states she will try to come by and get this done this week. She is still recovering from not feeling good. She is going tomorrow to see GI and I advised patient to see maybe they will be able to draw labs for her there.-Halynn Reitano V Jontavia Leatherbury, RMA

## 2017-05-26 ENCOUNTER — Encounter: Payer: Self-pay | Admitting: Gastroenterology

## 2017-05-26 ENCOUNTER — Ambulatory Visit (INDEPENDENT_AMBULATORY_CARE_PROVIDER_SITE_OTHER): Payer: Medicare Other | Admitting: Gastroenterology

## 2017-05-26 ENCOUNTER — Other Ambulatory Visit: Payer: Self-pay

## 2017-05-26 VITALS — BP 107/71 | HR 87 | Ht 65.0 in | Wt 155.6 lb

## 2017-05-26 DIAGNOSIS — R197 Diarrhea, unspecified: Secondary | ICD-10-CM | POA: Diagnosis not present

## 2017-05-26 MED ORDER — POLYETHYLENE GLYCOL 3350 17 GM/SCOOP PO POWD
ORAL | 3 refills | Status: DC
Start: 2017-05-26 — End: 2017-05-26

## 2017-05-26 MED ORDER — POLYETHYLENE GLYCOL 3350 17 GM/SCOOP PO POWD: ORAL | 3 refills | Status: DC

## 2017-05-26 NOTE — Progress Notes (Signed)
[name] MD, MRCP(U.K) [address]  [place]  [place], [place] [address]  Main: [phone]  Fax: [phone]   Primary Care Physician: Jerrol Banana., MD  Primary Gastroenterologist:  Dr. [name]   No chief complaint on file.   HPI: Renee Powell is a 83 y.o. female   Summary of history :  She was seen recently when admitted to the hospital on 05/03/17 for diarrhea of 1 week duration , non bloody , stool tests negative for infection.She absolutely denies any prior diarrhea   I performed a colonoscopy on 05/06/17 and I noted moderate inflammation throughout the colon , prep was poor, she had a 12-15 mm sigmoid colon polyp that I didn't excise due to the poor prep . I took multiple biopsies -biopsies of the terminal ileum showed mild active enteritis. Bx of right colon ,transverse colon ,sigmoid, rectum  showed mild to moderate active colitis. There was also features of chronicity seen as well . She says she has had issues with swallowing since she says the thyroid nodules have been "growing ", ongoing rather worse that she realized recently. More difficult to swallow solids > liquids. No weight loss. No choking or coughing when she swallows.     Interval history   05/13/2017 -05/26/17   05/13/17- Hb 11.6 ,albumin 2.7, CRP 12.9  Due for barium study on 06/05/17    She says that she has "turned around " feeling great . Was having diarrhea last visit, that has stopped, developed constipation on imodium. She stopped imodium . Presently having formed and rather hard stool. Feels tired. No blood in her stool. Swallowing is also fine at this time. She says that her swallowing is back to normal . Last few days having very hard stool , had pain during defecation . Presently has no issues.   Current Outpatient Prescriptions  Medication Sig Dispense Refill  . famotidine (PEPCID) 20 MG tablet Take 1 tablet (20 mg total) by mouth daily. 30 tablet 0  . potassium chloride  (K-DUR) 10 MEQ tablet Take 1 tablet (10 mEq total) by mouth 2 (two) times daily. 60 tablet 0  . SYNTHROID 75 MCG tablet Take 75 mcg by mouth daily.  11   No current facility-administered medications for this visit.     Allergies as of 05/26/2017 - Review Complete 05/20/2017  Allergen Reaction Noted  . Latex  05/04/2017    ROS:  General: Negative for anorexia, weight loss, fever, chills, fatigue, weakness. ENT: Negative for hoarseness, difficulty swallowing , nasal congestion. CV: Negative for chest pain, angina, palpitations, dyspnea on exertion, peripheral edema.  Respiratory: Negative for dyspnea at rest, dyspnea on exertion, cough, sputum, wheezing.  GI: See history of present illness. GU:  Negative for dysuria, hematuria, urinary incontinence, urinary frequency, nocturnal urination.  Endo: Negative for unusual weight change.    Physical Examination:   There were no vitals taken for this visit.  General: Well-nourished, well-developed in no acute distress.  Eyes: No icterus. Conjunctivae pink. Mouth: Oropharyngeal mucosa moist and pink , no lesions erythema or exudate. Lungs: Clear to auscultation bilaterally. Non-labored. Heart: Regular rate and rhythm, no murmurs rubs or gallops.  Abdomen: Bowel sounds are normal, nontender, nondistended, no hepatosplenomegaly or masses, no abdominal bruits or hernia , no rebound or guarding.   Extremities: No lower extremity edema. No clubbing or deformities. Neuro: Alert and oriented x 3.  Grossly intact. Skin: Warm and dry, no jaundice.   Psych: Alert and cooperative,  normal mood and affect.   Imaging Studies: Ct Abdomen Pelvis W Contrast  Result Date: 05/03/2017 CLINICAL DATA:  Diarrhea. EXAM: CT ABDOMEN AND PELVIS WITH CONTRAST TECHNIQUE: Multidetector CT imaging of the abdomen and pelvis was performed using the standard protocol following bolus administration of intravenous contrast. CONTRAST:  19mL ISOVUE-300 IOPAMIDOL (ISOVUE-300)  INJECTION 61% COMPARISON:  None. FINDINGS: Lower chest:  Unremarkable. Hepatobiliary: Tiny hypodensities in the liver parenchyma are too small to characterize but likely cysts. 13 mm noncalcified gallstone evident. No intrahepatic or extrahepatic biliary dilation. Pancreas: No focal mass lesion. No dilatation of the main duct. No intraparenchymal cyst. No peripancreatic edema. Spleen: No splenomegaly. No focal mass lesion. Adrenals/Urinary Tract: No adrenal nodule or mass. Central sinus cysts noted in both kidneys. No evidence for hydroureter. The urinary bladder appears normal for the degree of distention. Stomach/Bowel: Small to moderate hiatal hernia. Extremely large duodenal diverticulum evident. Duodenum is normally positioned as is the ligament of Treitz. No small bowel wall thickening. No small bowel dilatation. Numerous small bowel diverticuli are identified. The terminal ileum is normal. The appendix is normal. Diverticular changes are noted in the left colon without evidence of diverticulitis. Vascular/Lymphatic: There is abdominal aortic atherosclerosis without aneurysm. Portal vein and superior mesenteric vein are patent. Celiac axis and SMA are opacified. There is no gastrohepatic or hepatoduodenal ligament lymphadenopathy. No intraperitoneal or retroperitoneal lymphadenopathy. No pelvic sidewall lymphadenopathy. Reproductive: The uterus has normal CT imaging appearance. There is no adnexal mass. Other: No intraperitoneal free fluid. Musculoskeletal: Pelvic floor laxity evident. Bone windows reveal no worrisome lytic or sclerotic osseous lesions. IMPRESSION: 1. No acute findings in the abdomen or pelvis. No small or large bowel wall thickening in this patient with a reported history of diarrhea. 2. 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 No features on today's study to suggest diverticulitis. 3.  Aortic Atherosclerois (ICD10-170.0) 4. Cholelithiasis. Electronically  Signed   By: Misty Stanley M.D.   On: 05/03/2017 09:32   Dg Abdomen Acute W/chest  Result Date: 05/01/2017 CLINICAL DATA:  Abdominal distention, diarrhea EXAM: DG ABDOMEN ACUTE W/ 1V CHEST COMPARISON:  None. FINDINGS: Lungs are clear.  No pleural effusion or pneumothorax. The heart is normal in size. Small hiatal hernia. Nonobstructive bowel gas pattern. No evidence of free air under the diaphragm on the upright view. Visualized osseous structures are within normal limits. IMPRESSION: No evidence of acute cardiopulmonary disease. No evidence of small bowel obstruction or free air. Small hiatal hernia. Electronically Signed   By: Julian Hy M.D.   On: 05/01/2017 20:20    Assessment and Plan:   Renee Powell is a 83 y.o. y/o female  here to follow up to her hospital visit when she was admitted with acute diarrhea. Colonoscopy shows enteritis and pan colitis- mild to moderate. Biopsies do show some chronic changes which is suggestive of inflammatory bowel disease. Clinically all her diarrhea has resolved, she is rather constipated at this time. She feels overall significantly better today than she was feeling even a week back. She says she has no GI symptoms whatso ever presently and has no swallowing issues either . Since she has no diarrhea, rectal bleeding or adominal issues - will not put her on any medications for colitis at this time.    1. Cancel barium as she says her swallowing is back to normal and she would like to cancel.  2. I will see her back in 6-8 weeks to ensure she continues to not  have any worsening of symptoms or recurrence of diarrhea. It seems like all her symptoms are due to a gastroenterocolitis that has resolved. Suggest Mirlax PRN for constipation. Will recheck CRP at next visit    Dr [name]  MD,MRCP [facility]) Follow up in 6-8 weeks

## 2017-05-27 ENCOUNTER — Telehealth: Payer: Self-pay

## 2017-05-27 LAB — C-REACTIVE PROTEIN: CRP: 6.3 mg/L (ref <8.0)

## 2017-05-27 LAB — CBC WITH DIFFERENTIAL/PLATELET
BASOS ABS: 117 {cells}/uL (ref 0–200)
BASOS PCT: 1.7 %
EOS ABS: 214 {cells}/uL (ref 15–500)
Eosinophils Relative: 3.1 %
HEMATOCRIT: 35.3 % (ref 35.0–45.0)
HEMOGLOBIN: 11.4 g/dL — AB (ref 11.7–15.5)
LYMPHS ABS: 1877 {cells}/uL (ref 850–3900)
MCH: 28.5 pg (ref 27.0–33.0)
MCHC: 32.3 g/dL (ref 32.0–36.0)
MCV: 88.3 fL (ref 80.0–100.0)
MONOS PCT: 9 %
MPV: 11.2 fL (ref 7.5–12.5)
NEUTROS ABS: 4071 {cells}/uL (ref 1500–7800)
Neutrophils Relative %: 59 %
Platelets: 301 10*3/uL (ref 140–400)
RBC: 4 10*6/uL (ref 3.80–5.10)
RDW: 13.1 % (ref 11.0–15.0)
Total Lymphocyte: 27.2 %
WBC mixed population: 621 cells/uL (ref 200–950)
WBC: 6.9 10*3/uL (ref 3.8–10.8)

## 2017-05-27 LAB — COMPLETE METABOLIC PANEL WITH GFR
AG Ratio: 1.4 (calc) (ref 1.0–2.5)
ALBUMIN MSPROF: 3.4 g/dL — AB (ref 3.6–5.1)
ALKALINE PHOSPHATASE (APISO): 71 U/L (ref 33–130)
ALT: 9 U/L (ref 6–29)
AST: 13 U/L (ref 10–35)
BILIRUBIN TOTAL: 0.6 mg/dL (ref 0.2–1.2)
BUN / CREAT RATIO: 16 (calc) (ref 6–22)
BUN: 15 mg/dL (ref 7–25)
CHLORIDE: 107 mmol/L (ref 98–110)
CO2: 26 mmol/L (ref 20–32)
Calcium: 8.8 mg/dL (ref 8.6–10.4)
Creat: 0.92 mg/dL — ABNORMAL HIGH (ref 0.60–0.88)
GFR, Est African American: 68 mL/min/{1.73_m2} (ref >=60)
GFR, Est Non African American: 58 mL/min/{1.73_m2} — ABNORMAL LOW (ref >=60)
GLOBULIN: 2.5 g/dL (ref 1.9–3.7)
GLUCOSE: 87 mg/dL (ref 65–99)
Potassium: 4 mmol/L (ref 3.5–5.3)
SODIUM: 142 mmol/L (ref 135–146)
Total Protein: 5.9 g/dL — ABNORMAL LOW (ref 6.1–8.1)

## 2017-05-27 LAB — SEDIMENTATION RATE: SED RATE: 9 mm/h (ref 0–30)

## 2017-05-27 NOTE — Telephone Encounter (Signed)
-----   Message from Jerrol Banana., MD sent at 05/27/2017 11:05 AM EDT ----- Labs okay

## 2017-05-27 NOTE — Telephone Encounter (Signed)
Patient advised as below.  

## 2017-06-05 ENCOUNTER — Ambulatory Visit: Payer: Medicare Other | Attending: Gastroenterology

## 2017-06-29 ENCOUNTER — Encounter (INDEPENDENT_AMBULATORY_CARE_PROVIDER_SITE_OTHER): Payer: Medicare Other | Admitting: Family Medicine

## 2017-06-29 ENCOUNTER — Ambulatory Visit (INDEPENDENT_AMBULATORY_CARE_PROVIDER_SITE_OTHER): Payer: Medicare Other | Admitting: Family Medicine

## 2017-06-29 ENCOUNTER — Encounter: Payer: Self-pay | Admitting: Family Medicine

## 2017-06-29 VITALS — BP 124/82 | HR 94 | Temp 98.9°F | Resp 16 | Wt 152.8 lb

## 2017-06-29 DIAGNOSIS — I872 Venous insufficiency (chronic) (peripheral): Secondary | ICD-10-CM

## 2017-06-29 DIAGNOSIS — R600 Localized edema: Secondary | ICD-10-CM | POA: Diagnosis not present

## 2017-06-29 MED ORDER — FUROSEMIDE 20 MG PO TABS: 20.0000 mg | ORAL_TABLET | Freq: Every day | ORAL | 0 refills | Status: DC

## 2017-06-29 NOTE — Patient Instructions (Signed)
Take the potassium pills while on the fluid pill.

## 2017-06-29 NOTE — Progress Notes (Signed)
Subjective:     Patient ID: Renee Powell, female   DOB: 02-02-1934, 83 y.o.   MRN: [id number]  HPI  Chief Complaint  Patient presents with  . Leg Swelling    Patient comes in office today with conerns of swelling of her lower extremities intermittent for the past 3 weeks. Patient states that skin is red and burns when touching.   States swelling goes down at night then returns along with the redness over the course of the day.   Review of Systems     Objective:   Physical Exam  Constitutional: She appears well-developed and well-nourished. No distress.  Cardiovascular:  Pulses:      Dorsalis pedis pulses are 2+ on the right side, and 2+ on the left side.       Posterior tibial pulses are 2+ on the right side, and 2+ on the left side.  Musculoskeletal: Edema: no edema of left lower extremity and no calf tenderness bilaterally.  Skin: There is erythema (distal right lower extremity with 1+ edema of ankle.).       Assessment:    1. Bilateral leg edema - furosemide (LASIX) 20 MG tablet; Take 1 tablet (20 mg total) by mouth daily.  Dispense: 7 tablet; Refill: 0  2. Stasis dermatitis of both legs - furosemide (LASIX) 20 MG tablet; Take 1 tablet (20 mg total) by mouth daily.  Dispense: 7 tablet; Refill: 0    Plan:    Resume KCL daily she has at home.

## 2017-06-29 NOTE — Progress Notes (Signed)
Subjective       Objective             Assessment/Plan

## 2017-07-08 ENCOUNTER — Ambulatory Visit: Payer: Self-pay | Admitting: Family Medicine

## 2017-07-21 ENCOUNTER — Ambulatory Visit: Payer: Self-pay | Admitting: Family Medicine

## 2017-07-23 ENCOUNTER — Encounter: Payer: Self-pay | Admitting: Gastroenterology

## 2017-07-23 ENCOUNTER — Ambulatory Visit (INDEPENDENT_AMBULATORY_CARE_PROVIDER_SITE_OTHER): Payer: Medicare Other | Admitting: Gastroenterology

## 2017-07-23 VITALS — BP 124/73 | HR 76 | Temp 97.5°F | Ht 65.0 in | Wt 150.8 lb

## 2017-07-23 DIAGNOSIS — R197 Diarrhea, unspecified: Secondary | ICD-10-CM | POA: Diagnosis not present

## 2017-07-23 NOTE — Progress Notes (Signed)
[name] MD, MRCP(U.K) [address]  [place]  [place], [place] [address]  Main: [phone]  Fax: [phone]   Primary Care Physician: Jerrol Banana., MD  Primary Gastroenterologist:  Dr. [name]   Chief Complaint  Patient presents with  . Follow-up    HPI: Renee Powell is a 83 y.o. female   Summary of history :  She was seen recently when admitted to the hospital on 05/03/17 for diarrhea of 1 week duration , non bloody , stool tests negative for infection.She absolutely denies any prior diarrhea   I performed a colonoscopy on 05/06/17 and I noted moderate inflammation throughout the colon , prep was poor, she had a 12-15 mm sigmoid colon polyp that I didn't excise due to the poor prep . I took multiple biopsies -biopsies of the terminal ileum showed mild active enteritis. Bx of right colon ,transverse colon ,sigmoid, rectum showed mild to moderate active colitis. There was also features of chronicity seen as well . She says she has had issues with swallowing since she says the thyroid nodules have been "growing ", ongoing rather worse that she realized recently. More difficult to swallow solids >liquids. No weight loss. No choking or coughing when she swallows.   05/13/17- Hb 11.6 ,albumin 2.7, CRP 12.9  Interval history 05/26/17 -07/23/17  Denies any symptoms, doing well , no diarrhea or constipation .Keeping food down. She recalls having a bad steak prior to the beginning of her symptoms.   BP 124/73   Pulse 76   Temp (!) 97.5 F (36.4 C) (Oral)   Ht 5\' 5"  (1.651 m)   Wt 150 lb 12.8 oz (68.4 kg)   BMI 25.09 kg/m    Current Outpatient Medications  Medication Sig Dispense Refill  . levothyroxine (SYNTHROID, LEVOTHROID) 88 MCG tablet Take 88 mcg daily before breakfast by mouth.    Marland Kitchen atorvastatin (LIPITOR) 40 MG tablet TAKE 1 TABLET BY MOUTH EVERY DAY AT 6PM  0  . famotidine (PEPCID) 20 MG tablet Take 1 tablet (20 mg total) by mouth daily.  (Patient not taking: Reported on 07/23/2017) 30 tablet 0  . furosemide (LASIX) 20 MG tablet Take 1 tablet (20 mg total) by mouth daily. (Patient not taking: Reported on 07/23/2017) 7 tablet 0  . polyethylene glycol powder (GLYCOLAX/MIRALAX) powder 17 grams a day as needed if constipated (Patient not taking: Reported on 07/23/2017) 255 g 3  . potassium chloride (K-DUR) 10 MEQ tablet Take 1 tablet (10 mEq total) by mouth 2 (two) times daily. (Patient not taking: Reported on 07/23/2017) 60 tablet 0   No current facility-administered medications for this visit.     Allergies as of 07/23/2017 - Review Complete 07/23/2017  Allergen Reaction Noted  . Latex  05/04/2017    ROS:  General: Negative for anorexia, weight loss, fever, chills, fatigue, weakness. ENT: Negative for hoarseness, difficulty swallowing , nasal congestion. CV: Negative for chest pain, angina, palpitations, dyspnea on exertion, peripheral edema.  Respiratory: Negative for dyspnea at rest, dyspnea on exertion, cough, sputum, wheezing.  GI: See history of present illness. GU:  Negative for dysuria, hematuria, urinary incontinence, urinary frequency, nocturnal urination.  Endo: Negative for unusual weight change.    Physical Examination:   BP 124/73   Pulse 76   Temp (!) 97.5 F (36.4 C) (Oral)   Ht 5\' 5"  (1.651 m)   Wt 150 lb 12.8 oz (68.4 kg)   BMI 25.09 kg/m   General: Well-nourished, well-developed in  no acute distress.  Eyes: No icterus. Conjunctivae pink. Mouth: Oropharyngeal mucosa moist and pink , no lesions erythema or exudate. Lungs: Clear to auscultation bilaterally. Non-labored. Heart: Regular rate and rhythm, no murmurs rubs or gallops.  Abdomen: Bowel sounds are normal, nontender, nondistended, no hepatosplenomegaly or masses, no abdominal bruits or hernia , no rebound or guarding.   Extremities: No lower extremity edema. No clubbing or deformities. Neuro: Alert and oriented x 3.  Grossly intact. Skin:  Warm and dry, no jaundice.   Psych: Alert and cooperative, normal mood and affect.   Imaging Studies: No results found.  Assessment and Plan:   Renee Powell is a 83 y.o. y/o female here to follow up for  diarrhea. Colonoscopy showed enteritis and pan colitis- mild to moderate. Biopsies do show some chronic changes which is suggestive of inflammatory bowel disease. Clinically all her diarrhea has resolved more in keeping of a infectious gastroenteritis.    1. Follow up as needed , if symptoms recur will need to reconsider a diagnosis of IBD.   Dr [name]  MD,MRCP [facility]) Follow up PRN

## 2017-11-10 ENCOUNTER — Ambulatory Visit: Payer: Self-pay | Admitting: Family Medicine

## 2017-11-10 ENCOUNTER — Other Ambulatory Visit: Payer: Self-pay | Admitting: Family Medicine

## 2017-11-10 DIAGNOSIS — Z1231 Encounter for screening mammogram for malignant neoplasm of breast: Secondary | ICD-10-CM

## 2017-12-22 ENCOUNTER — Ambulatory Visit
Admission: RE | Admit: 2017-12-22 | Discharge: 2017-12-22 | Disposition: A | Discharge status: Home / Self Care | Payer: Medicare Other | Source: Ambulatory Visit | Attending: Family Medicine | Admitting: Family Medicine

## 2017-12-22 DIAGNOSIS — Z1231 Encounter for screening mammogram for malignant neoplasm of breast: Secondary | ICD-10-CM

## 2018-03-25 ENCOUNTER — Ambulatory Visit (INDEPENDENT_AMBULATORY_CARE_PROVIDER_SITE_OTHER): Payer: Medicare Other

## 2018-03-25 VITALS — BP 134/54 | HR 68 | Temp 98.8°F | Ht 65.0 in | Wt 138.8 lb

## 2018-03-25 DIAGNOSIS — Z0000 Encounter for general adult medical examination without abnormal findings: Secondary | ICD-10-CM | POA: Diagnosis not present

## 2018-03-25 NOTE — Progress Notes (Addendum)
Subjective:   Renee Powell is a 84 y.o. female who presents for Medicare Annual (Subsequent) preventive examination.  Review of Systems:  N/A  Cardiac Risk Factors include: advanced age (>68men, >61 women);dyslipidemia     Objective:     Vitals: BP (!) 134/54 (BP Location: Left Arm)   Pulse 68   Temp 98.8 F (37.1 C) (Oral)   Ht 5\' 5"  (1.651 m)   Wt 138 lb 12.8 oz (63 kg)   BMI 23.10 kg/m   Body mass index is 23.1 kg/m.  Advanced Directives 03/25/2018 05/03/2017 05/03/2017 05/01/2017 03/24/2016 03/22/2015  Does Patient Have a Medical Advance Directive? Yes Yes Yes No No Yes  Type of Paramedic of [place];Living will [place];Living will [place];Living will - - [place];Living will  Does patient want to make changes to medical advance directive? - No - Patient declined No - Patient declined - - No - Patient declined  Copy of [place] in Chart? Yes No - copy requested No - copy requested - - Yes  Would patient like information on creating a medical advance directive? - No - Patient declined No - Patient declined No - Patient declined No - patient declined information -    Tobacco Social History   Tobacco Use  Smoking Status Never Smoker  Smokeless Tobacco Never Used     Counseling given: Not Answered   Clinical Intake:  Pre-visit preparation completed: Yes  Pain : No/denies pain Pain Score: 0-No pain     Nutritional Status: BMI of 19-24  Normal Nutritional Risks: None Diabetes: No  How often do you need to have someone help you when you read instructions, pamphlets, or other written materials from your doctor or pharmacy?: 1 - Never  Interpreter Needed?: No  Information entered by :: [facility], LPN  Past Medical History:  Diagnosis Date  . Breast cancer [facility]) 2002   right lumpectomy with radiation  . Cancer [facility]) 2002   breast  . Hyperlipidemia     Past Surgical History:  Procedure Laterality Date  . BREAST BIOPSY Right 2002   breast ca radation  . BREAST SURGERY Right 2002   lumpectomy  . COLONOSCOPY WITH PROPOFOL N/A 05/06/2017   Procedure: COLONOSCOPY WITH PROPOFOL;  Surgeon: [name], MD;  Location: [facility] ENDOSCOPY;  Service: Gastroenterology;  Laterality: N/A;   Family History  Problem Relation Age of Onset  . Cancer Father        lung cancer  . Cancer Maternal Uncle   . Cancer Paternal Aunt        Breast  . Cancer Paternal Uncle        Kidney  . Cancer Paternal Uncle        Lung  . Heart attack Mother   . Cancer Paternal Grandmother        Breast  . Breast cancer Maternal Aunt 60  . Breast cancer Maternal Grandmother 75   Social History   Socioeconomic History  . Marital status: Widowed    Spouse name: Not on file  . Number of children: 2  . Years of education: Not on file  . Highest education level: 12th grade  Occupational History  . Occupation: retired  Scientific laboratory technician  . Financial resource strain: Not hard at all  . Food insecurity:    Worry: Never true    Inability: Never true  . Transportation needs:    Medical: No  Non-medical: No  Tobacco Use  . Smoking status: Never Smoker  . Smokeless tobacco: Never Used  Substance and Sexual Activity  . Alcohol use: No    Alcohol/week: 0.0 oz  . Drug use: No  . Sexual activity: Not Currently  Lifestyle  . Physical activity:    Days per week: Not on file    Minutes per session: Not on file  . Stress: Only a little  Relationships  . Social connections:    Talks on phone: Not on file    Gets together: Not on file    Attends religious service: Not on file    Active member of club or organization: Not on file    Attends meetings of clubs or organizations: Not on file    Relationship status: Not on file  Other Topics Concern  . Not on file  Social History Narrative  . Not on file    Outpatient Encounter Medications as of 03/25/2018   Medication Sig  . levothyroxine (SYNTHROID, LEVOTHROID) 88 MCG tablet Take 88 mcg daily before breakfast by mouth.  Marland Kitchen atorvastatin (LIPITOR) 40 MG tablet TAKE 1 TABLET BY MOUTH EVERY DAY AT 6PM  . famotidine (PEPCID) 20 MG tablet Take 1 tablet (20 mg total) by mouth daily. (Patient not taking: Reported on 07/23/2017)  . furosemide (LASIX) 20 MG tablet Take 1 tablet (20 mg total) by mouth daily. (Patient not taking: Reported on 07/23/2017)  . polyethylene glycol powder (GLYCOLAX/MIRALAX) powder 17 grams a day as needed if constipated (Patient not taking: Reported on 07/23/2017)  . potassium chloride (K-DUR) 10 MEQ tablet Take 1 tablet (10 mEq total) by mouth 2 (two) times daily. (Patient not taking: Reported on 07/23/2017)   No facility-administered encounter medications on file as of 03/25/2018.     Activities of Daily Living In your present state of health, do you have any difficulty performing the following activities: 03/25/2018 05/03/2017  Hearing? Y -  Comment Does not wear hearing aids. Pt has seen an audiologist in the past.  -  Vision? N -  Difficulty concentrating or making decisions? N -  Walking or climbing stairs? N -  Dressing or bathing? N -  Doing errands, shopping? N N  Preparing Food and eating ? N -  Using the Toilet? N -  In the past six months, have you accidently leaked urine? Y -  Comment Occasionally at night, does not have to wear protection.  -  Do you have problems with loss of bowel control? N -  Managing your Medications? N -  Managing your Finances? N -  Housekeeping or managing your Housekeeping? N -  Some recent data might be hidden    Patient Care Team: Jerrol Banana., MD as PCP - General (Family Medicine) Ronnald Collum, Lourdes Sledge, MD as Attending Physician (Endocrinology)    Assessment:   This is a routine wellness examination for Renee Powell.  Exercise Activities and Dietary recommendations Current Exercise Habits: The patient does not  participate in regular exercise at present, Exercise limited by: None identified  Goals    . DIET - INCREASE WATER INTAKE     Recommend increasing water intake to 3 glasses a day.        Fall Risk Fall Risk  03/25/2018 05/14/2017 02/13/2016 02/13/2016 07/11/2015  Falls in the past year? Yes No Yes No No  Number falls in past yr: 1 - 1 - -  Injury with Fall? No - No - -  Follow up Falls prevention  discussed - - - -   Is the patient's home free of loose throw rugs in walkways, pet beds, electrical cords, etc?   yes      Grab bars in the bathroom? yes      Handrails on the stairs?   no      Adequate lighting?   yes  Timed Get Up and Go performed: N/A  Depression Screen PHQ 2/9 Scores 03/25/2018 05/14/2017 02/13/2016 02/13/2016  PHQ - 2 Score 0 0 0 0     Cognitive Function: Pt declined screening today.          There is no immunization history on file for this patient.  Qualifies for Shingles Vaccine? Due for Shingles vaccine. Declined my offer to administer today. Education has been provided regarding the importance of this vaccine. Pt has been advised to call her insurance company to determine her out of pocket expense. Advised she may also receive this vaccine at her local pharmacy or Health Dept. Verbalized acceptance and understanding.  Screening Tests Health Maintenance  Topic Date Due  . TETANUS/TDAP  12/13/1952  . PNA vac Low Risk Adult (1 of 2 - PCV13) 12/12/2000  . INFLUENZA VACCINE  04/08/2018  . DEXA SCAN  Completed    Cancer Screenings: Lung: Low Dose CT Chest recommended if Age 65-80 years, 30 [place]-year currently smoking OR have quit w/in 15years. Patient does not qualify. Breast:  Up to date on Mammogram? Yes   Up to date of Bone Density/Dexa? Yes Colorectal: Up to date  Additional Screenings:  Hepatitis C Screening: N/A     Plan:  I have personally reviewed and addressed the Medicare Annual Wellness questionnaire and have noted the following in the patient's  chart:  A. Medical and social history B. Use of alcohol, tobacco or illicit drugs  C. Current medications and supplements D. Functional ability and status E.  Nutritional status F.  Physical activity G. Advance directives H. List of other physicians I.  Hospitalizations, surgeries, and ER visits in previous 12 months J.  [place] such as hearing and vision if needed, cognitive and depression L. Referrals and appointments - none  In addition, I have reviewed and discussed with patient certain preventive protocols, quality metrics, and best practice recommendations. A written personalized care plan for preventive services as well as general preventive health recommendations were provided to patient.  See attached scanned questionnaire for additional information.   Signed,  Fabio Neighbors, LPN Nurse Health Advisor   Nurse Recommendations: Pt declined the tetanus and pneumonia vaccines today.

## 2018-03-25 NOTE — Patient Instructions (Signed)
[name] , Thank you for taking time to come for your Medicare Wellness Visit. I appreciate your ongoing commitment to your health goals. Please review the following plan we discussed and let me know if I can assist you in the future.   Screening recommendations/referrals: Colonoscopy: Up to date Mammogram: Up to date Bone Density: Up to date Recommended yearly ophthalmology/optometry visit for glaucoma screening and checkup Recommended yearly dental visit for hygiene and checkup  Vaccinations: Influenza vaccine: N/A Pneumococcal vaccine: Pt declines today.  Tdap vaccine: Pt declines today.  Shingles vaccine: Pt declines today.     Advanced directives: Already on file.   Conditions/risks identified: Recommend increasing water intake to 3 glasses a day.   Next appointment: Pt declined scheduling a CPE following this apt or an AWV for 2020.   Preventive Care 62 Years and Older, Female Preventive care refers to lifestyle choices and visits with your health care provider that can promote health and wellness. What does preventive care include?  A yearly physical exam. This is also called an annual well check.  Dental exams once or twice a year.  Routine eye exams. Ask your health care provider how often you should have your eyes checked.  Personal lifestyle choices, including:  Daily care of your teeth and gums.  Regular physical activity.  Eating a healthy diet.  Avoiding tobacco and drug use.  Limiting alcohol use.  Practicing safe sex.  Taking low-dose aspirin every day.  Taking vitamin and mineral supplements as recommended by your health care provider. What happens during an annual well check? The services and screenings done by your health care provider during your annual well check will depend on your age, overall health, lifestyle risk factors, and family history of disease. Counseling  Your health care provider may ask you questions about your:  Alcohol  use.  Tobacco use.  Drug use.  Emotional well-being.  Home and relationship well-being.  Sexual activity.  Eating habits.  History of falls.  Memory and ability to understand (cognition).  Work and work Statistician.  Reproductive health. Screening  You may have the following tests or measurements:  Height, weight, and BMI.  Blood pressure.  Lipid and cholesterol levels. These may be checked every 5 years, or more frequently if you are over 47 years old.  Skin check.  Lung cancer screening. You may have this screening every year starting at age 103 if you have a 30-Marc[place]-year history of smoking and currently smoke or have quit within the past 15 years.  Fecal occult blood test (FOBT) of the stool. You may have this test every year starting at age 6.  Flexible sigmoidoscopy or colonoscopy. You may have a sigmoidoscopy every 5 years or a colonoscopy every 10 years starting at age 46.  Hepatitis C blood test.  Hepatitis B blood test.  Sexually transmitted disease (STD) testing.  Diabetes screening. This is done by checking your blood sugar (glucose) after you have not eaten for a while (fasting). You may have this done every 1-3 years.  Bone density scan. This is done to screen for osteoporosis. You may have this done starting at age 73.  Mammogram. This may be done every 1-2 years. Talk to your health care provider about how often you should have regular mammograms. Talk with your health care provider about your test results, treatment options, and if necessary, the need for more tests. Vaccines  Your health care provider may recommend certain vaccines, such as:  Influenza vaccine. This  is recommended every year.  Tetanus, diphtheria, and acellular pertussis (Tdap, Td) vaccine. You may need a Td booster every 10 years.  Zoster vaccine. You may need this after age 32.  Pneumococcal 13-valent conjugate (PCV13) vaccine. One dose is recommended after age  58.  Pneumococcal polysaccharide (PPSV23) vaccine. One dose is recommended after age 15. Talk to your health care provider about which screenings and vaccines you need and how often you need them. This information is not intended to replace advice given to you by your health care provider. Make sure you discuss any questions you have with your health care provider. Document Released: 09/21/2015 Document Revised: 05/14/2016 Document Reviewed: 06/26/2015 Elsevier Interactive Patient Education  2017 [place] Prevention in the Home Falls can cause injuries. They can happen to people of all ages. There are many things you can do to make your home safe and to help prevent falls. What can I do on the outside of my home?  Regularly fix the edges of walkways and driveways and fix any cracks.  Remove anything that might make you trip as you walk through a door, such as a raised step or threshold.  Trim any bushes or trees on the path to your home.  Use bright outdoor lighting.  Clear any walking paths of anything that might make someone trip, such as rocks or tools.  Regularly check to see if handrails are loose or broken. Make sure that both sides of any steps have handrails.  Any raised decks and porches should have guardrails on the edges.  Have any leaves, snow, or ice cleared regularly.  Use sand or salt on walking paths during winter.  Clean up any spills in your garage right away. This includes oil or grease spills. What can I do in the bathroom?  Use night lights.  Install grab bars by the toilet and in the tub and shower. Do not use towel bars as grab bars.  Use non-skid mats or decals in the tub or shower.  If you need to sit down in the shower, use a plastic, non-slip stool.  Keep the floor dry. Clean up any water that spills on the floor as soon as it happens.  Remove soap buildup in the tub or shower regularly.  Attach bath mats securely with double-sided  non-slip rug tape.  Do not have throw rugs and other things on the floor that can make you trip. What can I do in the bedroom?  Use night lights.  Make sure that you have a light by your bed that is easy to reach.  Do not use any sheets or blankets that are too big for your bed. They should not hang down onto the floor.  Have a firm chair that has side arms. You can use this for support while you get dressed.  Do not have throw rugs and other things on the floor that can make you trip. What can I do in the kitchen?  Clean up any spills right away.  Avoid walking on wet floors.  Keep items that you use a lot in easy-to-reach places.  If you need to reach something above you, use a strong step stool that has a grab bar.  Keep electrical cords out of the way.  Do not use floor polish or wax that makes floors slippery. If you must use wax, use non-skid floor wax.  Do not have throw rugs and other things on the floor that can make you  trip. What can I do with my stairs?  Do not leave any items on the stairs.  Make sure that there are handrails on both sides of the stairs and use them. Fix handrails that are broken or loose. Make sure that handrails are as long as the stairways.  Check any carpeting to make sure that it is firmly attached to the stairs. Fix any carpet that is loose or worn.  Avoid having throw rugs at the top or bottom of the stairs. If you do have throw rugs, attach them to the floor with carpet tape.  Make sure that you have a light switch at the top of the stairs and the bottom of the stairs. If you do not have them, ask someone to add them for you. What else can I do to help prevent falls?  Wear shoes that:  Do not have high heels.  Have rubber bottoms.  Are comfortable and fit you well.  Are closed at the toe. Do not wear sandals.  If you use a stepladder:  Make sure that it is fully opened. Do not climb a closed stepladder.  Make sure that both  sides of the stepladder are locked into place.  Ask someone to hold it for you, if possible.  Clearly mark and make sure that you can see:  Any grab bars or handrails.  First and last steps.  Where the edge of each step is.  Use tools that help you move around (mobility aids) if they are needed. These include:  Canes.  Walkers.  Scooters.  Crutches.  Turn on the lights when you go into a dark area. Replace any light bulbs as soon as they burn out.  Set up your furniture so you have a clear path. Avoid moving your furniture around.  If any of your floors are uneven, fix them.  If there are any pets around you, be aware of where they are.  Review your medicines with your doctor. Some medicines can make you feel dizzy. This can increase your chance of falling. Ask your doctor what other things that you can do to help prevent falls. This information is not intended to replace advice given to you by your health care provider. Make sure you discuss any questions you have with your health care provider. Document Released: 06/21/2009 Document Revised: 01/31/2016 Document Reviewed: 09/29/2014 Elsevier Interactive Patient Education  2017 [employer].

## 2018-11-11 ENCOUNTER — Other Ambulatory Visit: Payer: Self-pay | Admitting: Family Medicine

## 2018-11-15 ENCOUNTER — Telehealth: Payer: Self-pay | Admitting: Family Medicine

## 2018-11-15 NOTE — Telephone Encounter (Signed)
Patient not seen in about 1.5 years.  Appointment made to be seen before ordering mammogram.

## 2018-11-15 NOTE — Telephone Encounter (Signed)
°  Pt needing a referral  to go ahead with scheduling a mammogram.    Thanks, [place]

## 2018-11-22 NOTE — Telephone Encounter (Signed)
Patient is not due until after 4/16.  She has been instructed to make an appointment for [place] to see her next month and we will get her mammogram scheduled at that time.

## 2018-11-22 NOTE — Telephone Encounter (Signed)
Pt had an appt today but cancelled because she didn't want ot be expose to illnesses.  Can we do a referral for her with out coming in.   Phone  [phone]  Thanks Con Memos

## 2018-11-23 ENCOUNTER — Ambulatory Visit: Payer: Self-pay | Admitting: Family Medicine

## 2018-11-29 ENCOUNTER — Encounter: Payer: Self-pay | Admitting: Family Medicine

## 2018-11-29 ENCOUNTER — Ambulatory Visit: Payer: Medicare Other | Admitting: Family Medicine

## 2018-11-29 ENCOUNTER — Ambulatory Visit: Payer: Self-pay | Admitting: Family Medicine

## 2018-11-29 ENCOUNTER — Other Ambulatory Visit: Payer: Self-pay

## 2018-11-29 ENCOUNTER — Telehealth: Payer: Self-pay

## 2018-11-29 VITALS — BP 128/76 | HR 76 | Temp 98.1°F | Resp 16 | Ht 65.0 in | Wt 139.0 lb

## 2018-11-29 DIAGNOSIS — Z170 Estrogen receptor positive status [ER+]: Secondary | ICD-10-CM | POA: Diagnosis not present

## 2018-11-29 DIAGNOSIS — N8110 Cystocele, unspecified: Secondary | ICD-10-CM

## 2018-11-29 DIAGNOSIS — C50911 Malignant neoplasm of unspecified site of right female breast: Secondary | ICD-10-CM

## 2018-11-29 NOTE — Progress Notes (Signed)
Patient: Renee Powell Female    DOB: June 26, 1934   85 y.o.   MRN: [id number] Visit Date: 11/29/2018  Today's Provider: Wilhemena Durie, MD   Chief Complaint  Patient presents with  . Bladder Prolapse    possible    Subjective:     HPI Patient comes in today c/o possible bladder prolapse. She reports that when she had a BM about 2 days ago, she could feel her bladder protruding out. She denies any pain. She does have slight pressure.  She is also requesting a breast exam today.  She needs a breast exam for her follow-up mammogram.  Her husband died earlier this year but he was really having serious health problems the last couple years of his life and she is completely at peace with this.  Allergies  Allergen Reactions  . Latex      Current Outpatient Medications:  .  levothyroxine (SYNTHROID, LEVOTHROID) 88 MCG tablet, Take 88 mcg daily before breakfast by mouth., Disp: , Rfl:  .  atorvastatin (LIPITOR) 40 MG tablet, TAKE 1 TABLET BY MOUTH EVERY DAY AT 6PM, Disp: , Rfl: 0 .  famotidine (PEPCID) 20 MG tablet, Take 1 tablet (20 mg total) by mouth daily. (Patient not taking: Reported on 07/23/2017), Disp: 30 tablet, Rfl: 0 .  furosemide (LASIX) 20 MG tablet, Take 1 tablet (20 mg total) by mouth daily. (Patient not taking: Reported on 07/23/2017), Disp: 7 tablet, Rfl: 0 .  polyethylene glycol powder (GLYCOLAX/MIRALAX) powder, 17 grams a day as needed if constipated (Patient not taking: Reported on 07/23/2017), Disp: 255 g, Rfl: 3 .  potassium chloride (K-DUR) 10 MEQ tablet, Take 1 tablet (10 mEq total) by mouth 2 (two) times daily. (Patient not taking: Reported on 07/23/2017), Disp: 60 tablet, Rfl: 0  Review of Systems  Constitutional: Negative.   HENT: Negative.   Eyes: Negative.   Cardiovascular: Negative.   Gastrointestinal: Negative.   Endocrine: Negative.   Genitourinary: Negative.        Felt like she had to push things back in her vagina over the last several  days.No   Pelvic pain or vaginal bleeding.  Allergic/Immunologic: Negative.   Hematological: Negative.   Psychiatric/Behavioral: Negative.     Social History   Tobacco Use  . Smoking status: Never Smoker  . Smokeless tobacco: Never Used  Substance Use Topics  . Alcohol use: No    Alcohol/week: 0.0 standard drinks      Objective:   BP 128/76 (BP Location: Left Arm, Patient Position: Sitting, Cuff Size: Normal)   Pulse 76   Temp 98.1 F (36.7 C)   Resp 16   Ht 5\' 5"  (1.651 m)   Wt 139 lb (63 kg)   SpO2 98%   BMI 23.13 kg/m  Vitals:   11/29/18 1042  BP: 128/76  Pulse: 76  Resp: 16  Temp: 98.1 F (36.7 C)  SpO2: 98%  Weight: 139 lb (63 kg)  Height: 5\' 5"  (1.651 m)     Physical Exam Vitals signs reviewed. Exam conducted with a chaperone present.  Constitutional:      Appearance: Normal appearance.  HENT:     Head: Normocephalic and atraumatic.     Right Ear: External ear normal.     Left Ear: External ear normal.     Nose: Nose normal.  Eyes:     General: No scleral icterus.    Conjunctiva/sclera: Conjunctivae normal.  Cardiovascular:     Rate and  Rhythm: Normal rate and regular rhythm.     Heart sounds: Normal heart sounds.  Pulmonary:     Effort: Pulmonary effort is normal.  Chest:     Breasts:        Right: Inverted nipple present. No swelling, bleeding, mass, nipple discharge or skin change.        Left: Inverted nipple present. No swelling or nipple discharge.     Comments: Right breast: Thickening right areolar from 10 oclok to 2 oclock.  Abdominal:     Palpations: Abdomen is soft. There is no mass.  Genitourinary:    General: Normal vulva.     Comments: Moderate cystocele.  Bimanual exam benign. Musculoskeletal:        General: No swelling.  Skin:    General: Skin is warm and dry.  Neurological:     General: No focal deficit present.     Mental Status: She is alert and oriented to person, place, and time. Mental status is at baseline.   Psychiatric:        Mood and Affect: Mood normal.        Behavior: Behavior normal.        Thought Content: Thought content normal.        Judgment: Judgment normal.         Assessment & Plan    1. Female cystocele Discussed [place] and with patient. - Ambulatory referral to Gynecology  2. Malignant neoplasm of right breast in female, estrogen receptor positive, unspecified site of breast ([place]) Mammogram.  See her later in the year for routine follow-up.    I have done the exam and reviewed the above chart and it is accurate to the best of my knowledge. Development worker, community has been used in this note in any air is in the dictation or transcription are unintentional.  Wilhemena Durie, MD  [place]

## 2018-11-29 NOTE — Telephone Encounter (Signed)
Patient had called the office stating that on Saturday 11/27/18 while trying to pass a bowel movement she states "my bladder fell out." Patient states that it was out about 2 cm or inches? And states that she is able to push it back in but keeps falling out. Patient reports that she called [facility] ED and they told her not to come in to hospital because of high risk and to contact her PCP. Patient denies any pelvic pain or hematuria, I advised patient to go to ED and had addressed with Dr. Marlan Palau CMA Ernst Bowler) who discussed with Dr. Rosanna Randy. Patient was advised to come in office today for evaluation. KW

## 2018-11-29 NOTE — Patient Instructions (Signed)
Kegel Exercises  Kegel exercises help strengthen the muscles that support the rectum, vagina, small intestine, bladder, and uterus. Doing Kegel exercises can help:   Improve bladder and bowel control.   Improve sexual response.   Reduce problems and discomfort during pregnancy.  Kegel exercises involve squeezing your pelvic floor muscles, which are the same muscles you squeeze when you try to stop the flow of urine. The exercises can be done while sitting, standing, or lying down, but it is best to vary your position.  Exercises  1. Squeeze your pelvic floor muscles tight. You should feel a tight lift in your rectal area. If you are a female, you should also feel a tightness in your vaginal area. Keep your stomach, buttocks, and legs relaxed.  2. Hold the muscles tight for up to 10 seconds.  3. Relax your muscles.  Repeat this exercise 50 times a day or as many times as told by your health care provider. Continue to do this exercise for at least 4-6 weeks or for as long as told by your health care provider.  This information is not intended to replace advice given to you by your health care provider. Make sure you discuss any questions you have with your health care provider.  Document Released: 08/11/2012 Document Revised: 01/05/2017 Document Reviewed: 07/15/2015  Elsevier Interactive Patient Education  2019 Elsevier Inc[employer]

## 2018-12-07 ENCOUNTER — Other Ambulatory Visit (HOSPITAL_COMMUNITY)
Admission: RE | Admit: 2018-12-07 | Discharge: 2018-12-07 | Disposition: A | Discharge status: Home / Self Care | Payer: Medicare Other | Source: Ambulatory Visit | Attending: Obstetrics & Gynecology | Admitting: Obstetrics & Gynecology

## 2018-12-07 ENCOUNTER — Other Ambulatory Visit: Payer: Self-pay

## 2018-12-07 ENCOUNTER — Ambulatory Visit (INDEPENDENT_AMBULATORY_CARE_PROVIDER_SITE_OTHER): Payer: Medicare Other | Admitting: Obstetrics & Gynecology

## 2018-12-07 ENCOUNTER — Encounter: Payer: Self-pay | Admitting: Obstetrics & Gynecology

## 2018-12-07 VITALS — BP 120/80 | Ht 60.0 in | Wt 139.0 lb

## 2018-12-07 DIAGNOSIS — Z124 Encounter for screening for malignant neoplasm of cervix: Secondary | ICD-10-CM

## 2018-12-07 DIAGNOSIS — N812 Incomplete uterovaginal prolapse: Secondary | ICD-10-CM

## 2018-12-07 NOTE — Patient Instructions (Signed)
Pelvic Organ Prolapse Pelvic organ prolapse is the stretching, bulging, or dropping of pelvic organs into an abnormal position. It happens when the muscles and tissues that surround and support pelvic structures become weak or stretched. Pelvic organ prolapse can involve the:  Vagina (vaginal prolapse).  Uterus (uterine prolapse).  Bladder (cystocele).  Rectum (rectocele).  Intestines (enterocele). When organs other than the vagina are involved, they often bulge into the vagina or protrude from the vagina, depending on how severe the prolapse is. What are the causes? This condition may be caused by:  Pregnancy, labor, and childbirth.  Past pelvic surgery.  Decreased production of the hormone estrogen associated with menopause.  Consistently lifting more than 50 lb (23 kg).  Obesity.  Long-term inability to pass stool (chronic constipation).  A cough that lasts a long time (chronic).  Buildup of fluid in the abdomen due to certain diseases and other conditions. What are the signs or symptoms? Symptoms of this condition include:  Passing a little urine (loss of bladder control) when you cough, sneeze, strain, and exercise (stress incontinence). This may be worse immediately after childbirth. It may gradually improve over time.  Feeling pressure in your pelvis or vagina. This pressure may increase when you cough or when you are passing stool.  A bulge that protrudes from the opening of your vagina.  Difficulty passing urine or stool.  Pain in your lower back.  Pain, discomfort, or disinterest in sex.  Repeated bladder infections (urinary tract infections).  Difficulty inserting a tampon. In some people, this condition causes no symptoms. How is this diagnosed? This condition may be diagnosed based on a vaginal and rectal exam. During the exam, you may be asked to cough and strain while you are lying down, sitting, and standing up. Your health care provider will  determine if other tests are required, such as bladder function tests. How is this treated? Treatment for this condition may depend on your symptoms. Treatment may include:  Lifestyle changes, such as changes to your diet.  Emptying your bladder at scheduled times (bladder training therapy). This can help reduce or avoid urinary incontinence.  Estrogen. Estrogen may help mild prolapse by increasing the strength and tone of pelvic floor muscles.  Kegel exercises. These may help mild cases of prolapse by strengthening and tightening the muscles of the pelvic floor.  A soft, flexible device that helps support the vaginal walls and keep pelvic organs in place (pessary). This is inserted into your vagina by your health care provider.  Surgery. This is often the only form of treatment for severe prolapse. Follow these instructions at home:  Avoid drinking beverages that contain caffeine or alcohol.  Increase your intake of high-fiber foods. This can help decrease constipation and straining during bowel movements.  Lose weight if recommended by your health care provider.  Wear a sanitary pad or adult diapers if you have urinary incontinence.  Avoid heavy lifting and straining with exercise and work. Do not hold your breath when you perform mild to moderate lifting and exercise activities. Limit your activities as directed by your health care provider.  Do Kegel exercises as directed by your health care provider. To do this: ? Squeeze your pelvic floor muscles tight. You should feel a tight lift in your rectal area and a tightness in your vaginal area. Keep your stomach, buttocks, and legs relaxed. ? Hold the muscles tight for up to 10 seconds. ? Relax your muscles. ? Repeat this exercise 50 times a day,   or as many times as told by your health care provider. Continue to do this exercise for at least 4-6 weeks, or for as long as told by your health care provider.  Take over-the-counter and  prescription medicines only as told by your health care provider.  If you have a pessary, take care of it as told by your health care provider.  Keep all follow-up visits as told by your health care provider. This is important. Contact a health care provider if you:  Have symptoms that interfere with your daily activities or sex life.  Need medicine to help with the discomfort.  Notice bleeding from your vagina that is not related to your period.  Have a fever.  Have pain or bleeding when you urinate.  Have bleeding when you pass stool.  Pass urine when you have sex.  Have chronic constipation.  Have a pessary that falls out.  Have bad smelling vaginal discharge.  Have an unusual, low pain in your abdomen. Summary  Pelvic organ prolapse is the stretching, bulging, or dropping of pelvic organs into an abnormal position. It happens when the muscles and tissues that surround and support pelvic structures become weak or stretched.  When organs other than the vagina are involved, they often bulge into the vagina or protrude from the vagina, depending on how severe the prolapse is.  In most cases, this condition needs to be treated only if it produces symptoms. Treatment may include lifestyle changes, estrogen, Kegel exercises, pessary insertion, or surgery.  Avoid heavy lifting and straining with exercise and work. Do not hold your breath when you perform mild to moderate lifting and exercise activities. Limit your activities as directed by your health care provider. This information is not intended to replace advice given to you by your health care provider. Make sure you discuss any questions you have with your health care provider. Document Released: 03/22/2014 Document Revised: 09/16/2017 Document Reviewed: 09/16/2017 Elsevier Interactive Patient Education  2019 Elsevier Inc.  

## 2018-12-07 NOTE — Progress Notes (Signed)
Consultant- Dr Thurston Hole Reason- Dropped bladder  Cystocele/Rectocele Patient complains of a cystocele. Problem started 1 week ago. Symptoms include: prolapse of tissue with straining and discomfort: mild. Symptoms have started with a recent BM with straining.  She describes vaginal pressure, bulge.  Some low back pain.  No bleeding.  She notes risk factors of more heavy lifting over the past year as she has been moving after the death of her husband.  Not sexually active. Denies hormone use.  Denies leakage of urine or bowel disfunction.  Marland Kitchen     PMHx: She  has a past medical history of Breast cancer ([place]) (2002), Cancer ([place]) (2002), and Hyperlipidemia. Also,  has a past surgical history that includes Breast surgery (Right, 2002); Colonoscopy with propofol (N/A, 05/06/2017); and Breast biopsy (Right, 2002)., family history includes Breast cancer (age of onset: 60) in her maternal aunt and maternal grandmother; Cancer in her father, maternal uncle, paternal aunt, paternal grandmother, paternal uncle, and paternal uncle; Heart attack in her mother.,  reports that she has never smoked. She has never used smokeless tobacco. She reports that she does not drink alcohol or use drugs.  She has a current medication list which includes the following prescription(s): atorvastatin, levothyroxine, famotidine, furosemide, polyethylene glycol powder, and potassium chloride. Also, is allergic to latex.  Review of Systems  Constitutional: Negative for chills, fever and malaise/fatigue.  HENT: Negative for congestion, sinus pain and sore throat.   Eyes: Negative for blurred vision and pain.  Respiratory: Negative for cough and wheezing.   Cardiovascular: Negative for chest pain and leg swelling.  Gastrointestinal: Negative for abdominal pain, constipation, diarrhea, heartburn, nausea and vomiting.  Genitourinary: Negative for dysuria, frequency, hematuria and urgency.  Musculoskeletal: Negative for back pain,  joint pain, myalgias and neck pain.  Skin: Negative for itching and rash.  Neurological: Negative for dizziness, tremors and weakness.  Endo/Heme/Allergies: Does not bruise/bleed easily.  Psychiatric/Behavioral: Negative for depression. The patient is not nervous/anxious and does not have insomnia.     Objective: BP 120/80   Ht 5' (1.524 m)   Wt 139 lb (63 kg)   BMI 27.15 kg/m  Physical Exam Constitutional:      General: She is not in acute distress.    Appearance: She is well-developed.  Genitourinary:     Pelvic exam was performed with patient supine.     Vagina, uterus and rectum normal.     No lesions in the vagina.     No vaginal bleeding.     No cervical motion tenderness, friability, lesion or polyp.     Uterus is mobile.     Uterus is not enlarged.     No uterine mass detected.    Uterus is midaxial.     No right or left adnexal mass present.     Right adnexa not tender.     Left adnexa not tender.     Genitourinary Comments: Gr 2 uteine prolapse Gr 3 cystocele Min atrophy Min rectocele  HENT:     Head: Normocephalic and atraumatic. No laceration.     Right Ear: Hearing normal.     Left Ear: Hearing normal.     Mouth/Throat:     Pharynx: Uvula midline.  Eyes:     Pupils: Pupils are equal, round, and reactive to light.  Neck:     Musculoskeletal: Normal range of motion and neck supple.     Thyroid: No thyromegaly.  Cardiovascular:     Rate and Rhythm: Normal  rate and regular rhythm.     Heart sounds: No murmur. No friction rub. No gallop.   Pulmonary:     Effort: Pulmonary effort is normal. No respiratory distress.     Breath sounds: Normal breath sounds. No wheezing.  Abdominal:     General: Bowel sounds are normal. There is no distension.     Palpations: Abdomen is soft.     Tenderness: There is no abdominal tenderness. There is no rebound.  Musculoskeletal: Normal range of motion.  Neurological:     Mental Status: She is alert and oriented to person,  place, and time.     Cranial Nerves: No cranial nerve deficit.  Skin:    General: Skin is warm and dry.  Psychiatric:        Judgment: Judgment normal.  Vitals signs reviewed.     ASSESSMENT/PLAN:    Problem List Items Addressed This Visit      Genitourinary   Cystocele with incomplete uterovaginal prolapse - Primary Screen for cervical cancer   Cytology - PAP    Pessary Fitting Patient presents for a pessary fitting. She desires a pessary as her means of controlling her symptoms of prolapse and/or urinary incontinence. She understands the care needed for a pessary and desires to proceed. Alternative treatment options have been discussed at length and the patient voices an understanding of each option. Surgery a consideration in the future (TVH, BSO, AR).    PROCEDURE: The patient was placed in dorsal lithotomy position. Examination confirmed prolapse. A 3 Ring pessary was fitted without difficulty. The patient subsequently ambulated, voided and performed valsalva maneuvers without dislodging the pessary and without discomfort. Care instructions were provided. Patient was discharged to home in stable condition. Pessary ordered for use once arrives.   Barnett Applebaum, MD, [facility], [facility] 12/07/2018  9:19 AM

## 2018-12-09 LAB — CYTOLOGY - PAP: DIAGNOSIS: NEGATIVE

## 2018-12-29 ENCOUNTER — Telehealth: Payer: Self-pay | Admitting: Obstetrics & Gynecology

## 2018-12-29 NOTE — Telephone Encounter (Signed)
-----   Message from [place], LPN sent at 03/17/6268  1:51 PM EDT ----- Regarding: FW: pessary Pessary arrived. Please contact pt to schedule apt for insertion ----- Message ----- From: Quintella Baton, [place] Sent: 12/07/2018   9:15 AM EDT To: Otila Kluver, LPN Subject: pessary                                        Ring#3 with support

## 2018-12-29 NOTE — Telephone Encounter (Signed)
Patient is schedule 01/05/19 with [facility]

## 2019-01-05 ENCOUNTER — Other Ambulatory Visit: Payer: Self-pay

## 2019-01-05 ENCOUNTER — Encounter: Payer: Self-pay | Admitting: Obstetrics & Gynecology

## 2019-01-05 ENCOUNTER — Ambulatory Visit (INDEPENDENT_AMBULATORY_CARE_PROVIDER_SITE_OTHER): Payer: Medicare Other | Admitting: Obstetrics & Gynecology

## 2019-01-05 VITALS — BP 120/70 | Ht 60.0 in | Wt 141.0 lb

## 2019-01-05 DIAGNOSIS — N812 Incomplete uterovaginal prolapse: Secondary | ICD-10-CM

## 2019-01-05 NOTE — Progress Notes (Signed)
  HPI:      [name] is a 85 y.o. G2P2 who presents today for her pessary insertion and examination related to her pelvic floor weakening.  Symptoms include: prolapse of tissue with straining and discomfort: mild. Symptoms have started with a recent BM with straining.  She describes vaginal pressure, bulge.  Some low back pain.  No bleeding.  She notes risk factors of more heavy lifting over the past year as she has been moving after the death of her husband.  Not sexually active. Denies hormone use.  Denies leakage of urine or bowel disfunction. Her pessary has arrived for placement as her choice for therapy, weighed against surgery and expectant management.  PMHx: She  has a past medical history of Breast cancer ([place]) (2002), Cancer ([place]) (2002), and Hyperlipidemia. Also,  has a past surgical history that includes Breast surgery (Right, 2002); Colonoscopy with propofol (N/A, 05/06/2017); and Breast biopsy (Right, 2002)., family history includes Breast cancer (age of onset: 57) in her maternal aunt and maternal grandmother; Cancer in her father, maternal uncle, paternal aunt, paternal grandmother, paternal uncle, and paternal uncle; Heart attack in her mother.,  reports that she has never smoked. She has never used smokeless tobacco. She reports that she does not drink alcohol or use drugs.  She has a current medication list which includes the following prescription(s): atorvastatin, famotidine, furosemide, levothyroxine, polyethylene glycol powder, and potassium chloride. Also, is allergic to latex.  Review of Systems  All other systems reviewed and are negative.  Objective: BP 120/70   Ht 5' (1.524 m)   Wt 141 lb (64 kg)   BMI 27.54 kg/m  Physical Exam Constitutional:      General: She is not in acute distress.    Appearance: She is well-developed.  Genitourinary:     Pelvic exam was performed with patient supine.     Vagina and uterus normal.     No vaginal erythema or bleeding.      No cervical motion tenderness, discharge, polyp or nabothian cyst.     Uterus is mobile.     Uterus is not enlarged.     No uterine mass detected.    Uterus is midaxial.     No right or left adnexal mass present.     Right adnexa not tender.     Left adnexa not tender.     Genitourinary Comments: Gr 2 uteine prolapse Gr 3 cystocele Min atrophy Min rectocele   HENT:     Head: Normocephalic and atraumatic.     Nose: Nose normal.  Abdominal:     General: There is no distension.     Palpations: Abdomen is soft.     Tenderness: There is no abdominal tenderness.  Musculoskeletal: Normal range of motion.  Neurological:     Mental Status: She is alert and oriented to person, place, and time.     Cranial Nerves: No cranial nerve deficit.  Skin:    General: Skin is warm and dry.    Pessary Care Pessary placed without difficulty today.  A/P:1. Cystocele with incomplete uterovaginal prolapse Pessary was placed today. Instructions given for care. Concerning symptoms to observe for are counseled to patient. Follow up scheduled for 3 months.  A total of 15 minutes were spent face-to-face with the patient during this encounter and over half of that time dealt with counseling and coordination of care.  Barnett Applebaum, MD, [facility], [facility] 01/05/2019  8:07 AM

## 2019-01-12 ENCOUNTER — Other Ambulatory Visit: Payer: Self-pay | Admitting: Family Medicine

## 2019-01-12 DIAGNOSIS — Z1231 Encounter for screening mammogram for malignant neoplasm of breast: Secondary | ICD-10-CM

## 2019-02-24 ENCOUNTER — Ambulatory Visit (INDEPENDENT_AMBULATORY_CARE_PROVIDER_SITE_OTHER): Payer: Medicare Other | Admitting: Obstetrics & Gynecology

## 2019-02-24 ENCOUNTER — Other Ambulatory Visit: Payer: Self-pay

## 2019-02-24 ENCOUNTER — Telehealth: Payer: Self-pay

## 2019-02-24 ENCOUNTER — Encounter: Payer: Self-pay | Admitting: Obstetrics & Gynecology

## 2019-02-24 VITALS — BP 120/80 | Ht 60.0 in | Wt 140.0 lb

## 2019-02-24 DIAGNOSIS — N812 Incomplete uterovaginal prolapse: Secondary | ICD-10-CM

## 2019-02-24 NOTE — Telephone Encounter (Signed)
Pt is having trouble c her pessary; is leaving for the beach at 3:30; can she possibly be seen before then?  [phone]  Pt scheduled for 1:30 c PH.

## 2019-02-24 NOTE — Progress Notes (Signed)
  HPI:      Renee Powell is a 85 y.o. G2P2 who presents today for her pessary follow up and examination related to her pelvic floor weakening.  She feels it has fallen down last night, but not out.  Pt reports tolerating the pessary very well with no vaginal bleeding and no vaginal discharge.  Symptoms of pelvic floor weakening have greatly improved. She is voiding and defecating without difficulty. She currently has a #3 Ring pessary.  PMHx: She  has a past medical history of Breast cancer ([place]) (2002), Cancer ([place]) (2002), and Hyperlipidemia. Also,  has a past surgical history that includes Breast surgery (Right, 2002); Colonoscopy with propofol (N/A, 05/06/2017); and Breast biopsy (Right, 2002)., family history includes Breast cancer (age of onset: 39) in her maternal aunt and maternal grandmother; Cancer in her father, maternal uncle, paternal aunt, paternal grandmother, paternal uncle, and paternal uncle; Heart attack in her mother.,  reports that she has never smoked. She has never used smokeless tobacco. She reports that she does not drink alcohol or use drugs.  She has a current medication list which includes the following prescription(s): atorvastatin, famotidine, furosemide, levothyroxine, polyethylene glycol powder, and potassium chloride. Also, is allergic to latex.  Review of Systems  All other systems reviewed and are negative.   Objective: BP 120/80   Ht 5' (1.524 m)   Wt 140 lb (63.5 kg)   BMI 27.34 kg/m  Physical Exam Constitutional:      General: She is not in acute distress.    Appearance: She is well-developed.  Genitourinary:     Pelvic exam was performed with patient supine.     Vagina and uterus normal.     No vaginal erythema or bleeding.     No cervical motion tenderness, discharge, polyp or nabothian cyst.     Uterus is mobile.     Uterus is not enlarged.     No uterine mass detected.    Uterus is midaxial.     No right or left adnexal mass present.   Right adnexa not tender.     Left adnexa not tender.     Genitourinary Comments: Cystocele Gr 3 Uterine prolapse Gr 2  HENT:     Head: Normocephalic and atraumatic.     Nose: Nose normal.  Abdominal:     General: There is no distension.     Palpations: Abdomen is soft.     Tenderness: There is no abdominal tenderness.  Musculoskeletal: Normal range of motion.  Neurological:     Mental Status: She is alert and oriented to person, place, and time.     Cranial Nerves: No cranial nerve deficit.  Skin:    General: Skin is warm and dry.     Pessary Care Pessary removed and cleaned.  Vagina checked - without erosions - pessary replaced.  A/P:1. Cystocele with incomplete uterovaginal prolapse Pessary was cleaned and replaced today. Did not appear to be undergoing expulsion Instructions given for care. Concerning symptoms to observe for are counseled to patient. Follow up scheduled for 3 months.  A total of 15 minutes were spent face-to-face with the patient during this encounter and over half of that time dealt with counseling and coordination of care.  Barnett Applebaum, MD, [facility], [facility] 02/24/2019  2:05 PM

## 2019-03-30 ENCOUNTER — Ambulatory Visit
Admission: RE | Admit: 2019-03-30 | Discharge: 2019-03-30 | Disposition: A | Discharge status: Home / Self Care | Payer: Medicare Other | Source: Ambulatory Visit | Attending: Family Medicine | Admitting: Family Medicine

## 2019-03-30 ENCOUNTER — Other Ambulatory Visit: Payer: Self-pay

## 2019-03-30 DIAGNOSIS — Z1231 Encounter for screening mammogram for malignant neoplasm of breast: Secondary | ICD-10-CM | POA: Diagnosis not present

## 2019-04-06 ENCOUNTER — Ambulatory Visit: Payer: Medicare Other | Admitting: Obstetrics & Gynecology

## 2019-04-18 ENCOUNTER — Ambulatory Visit: Payer: Self-pay | Admitting: Family Medicine

## 2019-05-30 ENCOUNTER — Ambulatory Visit (INDEPENDENT_AMBULATORY_CARE_PROVIDER_SITE_OTHER): Payer: Medicare Other | Admitting: Obstetrics & Gynecology

## 2019-05-30 ENCOUNTER — Encounter: Payer: Self-pay | Admitting: Obstetrics & Gynecology

## 2019-05-30 ENCOUNTER — Other Ambulatory Visit: Payer: Self-pay

## 2019-05-30 VITALS — BP 130/80 | Ht 60.0 in | Wt 141.0 lb

## 2019-05-30 DIAGNOSIS — N812 Incomplete uterovaginal prolapse: Secondary | ICD-10-CM

## 2019-05-30 NOTE — Progress Notes (Signed)
HPI:      [name] is a 85 y.o. G2P2 who presents today for her pessary follow up and examination related to her pelvic floor weakening.  Pt reports tolerating the pessary well with  no vaginal bleeding and  no vaginal discharge.  Symptoms of pelvic floor weakening have greatly improved. She is voiding and defecating without difficulty. She currently has a Ring #3 pessary.  PMHx: She  has a past medical history of Breast cancer ([place]) (2002), Cancer ([place]) (2002), Hyperlipidemia, and Personal history of radiation therapy (2002). Also,  has a past surgical history that includes Breast surgery (Right, 2002); Colonoscopy with propofol (N/A, 05/06/2017); and Breast biopsy (Right, 2002)., family history includes Breast cancer (age of onset: 65) in her maternal aunt and maternal grandmother; Cancer in her father, maternal uncle, paternal aunt, paternal grandmother, paternal uncle, and paternal uncle; Heart attack in her mother.,  reports that she has never smoked. She has never used smokeless tobacco. She reports that she does not drink alcohol or use drugs.  She has a current medication list which includes the following prescription(s): furosemide, atorvastatin, famotidine, levothyroxine, polyethylene glycol powder, and potassium chloride. Also, is allergic to latex.  Review of Systems  All other systems reviewed and are negative.   Objective: BP 130/80   Ht 5' (1.524 m)   Wt 141 lb (64 kg)   BMI 27.54 kg/m  Physical Exam Constitutional:      General: She is not in acute distress.    Appearance: She is well-developed.  Genitourinary:     Pelvic exam was performed with patient supine.     Vagina and uterus normal.     No vaginal erythema or bleeding.     No cervical motion tenderness, discharge, polyp or nabothian cyst.     Uterus is mobile.     Uterus is not enlarged.     No uterine mass detected.    Uterus is midaxial.     No right or left adnexal mass present.     Right adnexa  not tender.     Left adnexa not tender.     Genitourinary Comments: Gr 2 cystocele Uterine prolpase grade 2  HENT:     Head: Normocephalic and atraumatic.     Nose: Nose normal.  Abdominal:     General: There is no distension.     Palpations: Abdomen is soft.     Tenderness: There is no abdominal tenderness.  Musculoskeletal: Normal range of motion.  Neurological:     Mental Status: She is alert and oriented to person, place, and time.     Cranial Nerves: No cranial nerve deficit.  Skin:    General: Skin is warm and dry.  Psychiatric:        Attention and Perception: Attention normal.        Mood and Affect: Mood and affect normal.        Speech: Speech normal.        Behavior: Behavior normal.        Thought Content: Thought content normal.        Judgment: Judgment normal.     Pessary Care Pessary removed and cleaned.  Vagina checked - without erosions - pessary replaced.  A/P:   ICD-10-CM   1. Cystocele with incomplete uterovaginal prolapse  N81.2    Pessary was cleaned and replaced today. Instructions given for care. Concerning symptoms to observe for are counseled to patient. Follow up scheduled for 3 months.  A total of 15 minutes were spent face-to-face with the patient during this encounter and over half of that time dealt with counseling and coordination of care.  Barnett Applebaum, MD, [facility], [facility] 05/30/2019  9:02 AM

## 2019-05-31 ENCOUNTER — Ambulatory Visit: Payer: Medicare Other | Admitting: Obstetrics & Gynecology

## 2019-08-29 ENCOUNTER — Other Ambulatory Visit: Payer: Self-pay

## 2019-08-29 ENCOUNTER — Ambulatory Visit (INDEPENDENT_AMBULATORY_CARE_PROVIDER_SITE_OTHER): Payer: Medicare Other | Admitting: Obstetrics & Gynecology

## 2019-08-29 ENCOUNTER — Encounter: Payer: Self-pay | Admitting: Obstetrics & Gynecology

## 2019-08-29 VITALS — BP 110/70 | Ht 60.0 in | Wt 140.0 lb

## 2019-08-29 DIAGNOSIS — N812 Incomplete uterovaginal prolapse: Secondary | ICD-10-CM

## 2019-08-29 NOTE — Progress Notes (Signed)
HPI:      [name] is a 85 y.o. G2P2 who presents today for her pessary follow up and examination related to her pelvic floor weakening.  Pt reports tolerating the pessary well with  no vaginal bleeding and  no vaginal discharge.  Symptoms of pelvic floor weakening have greatly improved. She is voiding and defecating without difficulty. She currently has a RING #3 pessary.  PMHx: She  has a past medical history of Breast cancer ([place]) (2002), Cancer ([place]) (2002), Hyperlipidemia, and Personal history of radiation therapy (2002). Also,  has a past surgical history that includes Breast surgery (Right, 2002); Colonoscopy with propofol (N/A, 05/06/2017); and Breast biopsy (Right, 2002)., family history includes Breast cancer (age of onset: 39) in her maternal aunt and maternal grandmother; Cancer in her father, maternal uncle, paternal aunt, paternal grandmother, paternal uncle, and paternal uncle; Heart attack in her mother.,  reports that she has never smoked. She has never used smokeless tobacco. She reports that she does not drink alcohol or use drugs.  She has a current medication list which includes the following prescription(s): levothyroxine, atorvastatin, famotidine, furosemide, polyethylene glycol powder, and potassium chloride. Also, is allergic to latex.  Review of Systems  All other systems reviewed and are negative.   Objective: BP 110/70   Ht 5' (1.524 m)   Wt 140 lb (63.5 kg)   BMI 27.34 kg/m  Physical Exam Constitutional:      General: She is not in acute distress.    Appearance: She is well-developed.  Genitourinary:     Pelvic exam was performed with patient supine.     Vulva, urethra, bladder, vagina and uterus normal.     No vaginal erythema or bleeding.     No cervical motion tenderness, discharge, polyp or nabothian cyst.     Uterus is mobile.     Uterus is not enlarged.     No uterine mass detected.    Uterus is midaxial.     No right or left adnexal mass  present.     Right adnexa not tender.     Left adnexa not tender.     Genitourinary Comments: Cystocele and mild uterine prolpase noted Pessary appropriated fit and size  HENT:     Head: Normocephalic and atraumatic.     Nose: Nose normal.  Abdominal:     General: There is no distension.     Palpations: Abdomen is soft.     Tenderness: There is no abdominal tenderness.  Musculoskeletal:        General: Normal range of motion.  Neurological:     Mental Status: She is alert and oriented to person, place, and time.     Cranial Nerves: No cranial nerve deficit.  Skin:    General: Skin is warm and dry.  Psychiatric:        Attention and Perception: Attention normal.        Mood and Affect: Mood and affect normal.        Speech: Speech normal.        Behavior: Behavior normal.        Thought Content: Thought content normal.        Judgment: Judgment normal.     Pessary Care Pessary removed and cleaned.  Vagina checked - without erosions - pessary replaced.  A/P:1. Cystocele with incomplete uterovaginal prolapse Pessary was cleaned and replaced today. Instructions given for care. Concerning symptoms to observe for are counseled to patient. Follow up  scheduled for 3 months.  A total of 15 minutes were spent face-to-face with the patient during this encounter and over half of that time dealt with counseling and coordination of care.  Barnett Applebaum, MD, [facility], [facility] 08/29/2019  8:41 AM

## 2019-09-09 DIAGNOSIS — E079 Disorder of thyroid, unspecified: Secondary | ICD-10-CM

## 2019-09-09 HISTORY — DX: Disorder of thyroid, unspecified: E07.9

## 2019-11-21 ENCOUNTER — Ambulatory Visit (INDEPENDENT_AMBULATORY_CARE_PROVIDER_SITE_OTHER): Payer: Medicare Other | Admitting: Dermatology

## 2019-11-21 ENCOUNTER — Other Ambulatory Visit: Payer: Self-pay

## 2019-11-21 ENCOUNTER — Encounter: Payer: Self-pay | Admitting: Dermatology

## 2019-11-21 VITALS — Ht 65.5 in | Wt 140.0 lb

## 2019-11-21 DIAGNOSIS — L820 Inflamed seborrheic keratosis: Secondary | ICD-10-CM

## 2019-11-21 DIAGNOSIS — L578 Other skin changes due to chronic exposure to nonionizing radiation: Secondary | ICD-10-CM | POA: Diagnosis not present

## 2019-11-21 DIAGNOSIS — D692 Other nonthrombocytopenic purpura: Secondary | ICD-10-CM

## 2019-11-21 DIAGNOSIS — L821 Other seborrheic keratosis: Secondary | ICD-10-CM

## 2019-11-21 DIAGNOSIS — L570 Actinic keratosis: Secondary | ICD-10-CM

## 2019-11-21 NOTE — Progress Notes (Signed)
   Follow-Up Visit   Subjective  Renee Powell is a 86 y.o. female who presents for the following: ISK's (recheck previously treated lesions R temple, L temple, R ant leg - still present per pt. Other similar lesions on the face) and purpura (Pt hit her leg on the bath tub. ).  The following portions of the chart were reviewed this encounter and updated as appropriate:     Review of Systems: No other skin or systemic complaints.  Objective  Well appearing patient in no apparent distress; mood and affect are within normal limits.  A focused examination was performed including back, face,neck, and legs . Relevant physical exam findings are noted in the Assessment and Plan.  Objective  Nose x 2: Erythematous thin papules/macules with gritty scale.   Objective  Face, extremities, trunk: Diffuse scaly erythematous macules with underlying dyspigmentation.   Objective  R brow x 1, forehead x 18, L temple x 3, R preauricular x 1: Erythematous keratotic or waxy stuck-on papule or plaque.   Objective  R pretibial: Purple macules  Objective  Face, extremities, trunk: Stuck-on, waxy, tan-brown papule or plaque --Discussed benign etiology and prognosis.   Assessment & Plan  AK (actinic keratosis) Nose x 2  Destruction of lesion - Nose x 2  Destruction method: cryotherapy   Lesion destroyed using liquid nitrogen: Yes (x 2)   Outcome: patient tolerated procedure well with no complications    Actinic skin damage Face, extremities, trunk  The patient will observe these symptoms, and report promptly any worsening or unexpected persistence.  If well, may return prn.   Inflamed seborrheic keratosis R brow x 1, forehead x 18, L temple x 3, R preauricular x 1  Destruction of lesion - R brow x 1, forehead x 18, L temple x 3, R preauricular x 1  Destruction method: cryotherapy   Lesion destroyed using liquid nitrogen: Yes (x 23)   Outcome: patient tolerated procedure well with no  complications    Other nonthrombocytopenic purpura (HCC) R pretibial  Traumatic purpura - the patient will observe these symptoms, and report promptly any worsening or unexpected persistence.  If well, may return prn.   Seborrheic keratosis Face, extremities, trunk  The patient will observe these symptoms, and report promptly any worsening or unexpected persistence.  If well, may return prn.   Return in about 1 year (around 11/20/2020).

## 2019-11-28 ENCOUNTER — Encounter: Payer: Self-pay | Admitting: Obstetrics & Gynecology

## 2019-11-28 ENCOUNTER — Ambulatory Visit (INDEPENDENT_AMBULATORY_CARE_PROVIDER_SITE_OTHER): Payer: Medicare Other | Admitting: Obstetrics & Gynecology

## 2019-11-28 ENCOUNTER — Other Ambulatory Visit: Payer: Self-pay

## 2019-11-28 VITALS — BP 120/80 | Ht 60.0 in | Wt 142.0 lb

## 2019-11-28 DIAGNOSIS — N8110 Cystocele, unspecified: Secondary | ICD-10-CM | POA: Diagnosis not present

## 2019-11-28 DIAGNOSIS — N812 Incomplete uterovaginal prolapse: Secondary | ICD-10-CM | POA: Diagnosis not present

## 2019-11-28 DIAGNOSIS — N3281 Overactive bladder: Secondary | ICD-10-CM

## 2019-11-28 MED ORDER — TOLTERODINE TARTRATE ER 2 MG PO CP24: 2.0000 mg | ORAL_CAPSULE | Freq: Every day | ORAL | 11 refills | Status: DC

## 2019-11-28 NOTE — Progress Notes (Signed)
HPI:      [name] is a 86 y.o. G2P2 who presents today for her pessary follow up and examination related to her pelvic floor weakening.  Pt reports tolerating the pessary well with no vaginal bleeding and no vaginal discharge.  Symptoms of pelvic floor weakening have greatly improved. She is voiding and defecating without difficulty ALTHOUGH she does report URINARY NOCTURIA and FREQUENCY (3-4 times per night, every 2-3 hours during day) with occas LOU.  She currently has a Ring #3 pessary.  PMHx: She  has a past medical history of Breast cancer ([place]) (2002), Cancer ([place]) (2002), Hyperlipidemia, Personal history of radiation therapy (2002), and Thyroid disease (09/2019). Also,  has a past surgical history that includes Breast surgery (Right, 2002); Colonoscopy with propofol (N/A, 05/06/2017); and Breast biopsy (Right, 2002)., family history includes Breast cancer (age of onset: 61) in her maternal aunt and maternal grandmother; Cancer in her father, maternal uncle, paternal aunt, paternal grandmother, paternal uncle, and paternal uncle; Heart attack in her mother.,  reports that she has never smoked. She has never used smokeless tobacco. She reports that she does not drink alcohol or use drugs.  She has a current medication list which includes the following prescription(s): famotidine, furosemide, levothyroxine, polyethylene glycol powder, and potassium chloride. Also, is allergic to latex.  Review of Systems  Genitourinary: Positive for frequency and urgency.  All other systems reviewed and are negative.   Objective: BP 120/80   Ht 5' (1.524 m)   Wt 142 lb (64.4 kg)   BMI 27.73 kg/m  Physical Exam Constitutional:      General: She is not in acute distress.    Appearance: She is well-developed.  Genitourinary:     Pelvic exam was performed with patient supine.     Vagina and uterus normal.     No vaginal erythema or bleeding.     No cervical motion tenderness, discharge, polyp  or nabothian cyst.     Uterus is mobile.     Uterus is not enlarged.     No uterine mass detected.    Uterus is midaxial.     No right or left adnexal mass present.     Right adnexa not tender.     Left adnexa not tender.     Genitourinary Comments: Gr 2 uteine prolapse Gr 3 cystocele Min atrophy Min rectocele   HENT:     Head: Normocephalic and atraumatic.     Nose: Nose normal.  Abdominal:     General: There is no distension.     Palpations: Abdomen is soft.     Tenderness: There is no abdominal tenderness.  Musculoskeletal:        General: Normal range of motion.  Neurological:     Mental Status: She is alert and oriented to person, place, and time.     Cranial Nerves: No cranial nerve deficit.  Skin:    General: Skin is warm and dry.  Psychiatric:        Attention and Perception: Attention normal.        Mood and Affect: Mood and affect normal.        Speech: Speech normal.        Behavior: Behavior normal.        Thought Content: Thought content normal.        Judgment: Judgment normal.     Pessary Care Pessary removed and cleaned.  Vagina checked - without erosions - pessary replaced.  A/P:1.  Cystocele with incomplete uterovaginal prolapse 2. Overactive bladder  Plan Detrol (or similar medicine) for OAB tx.  Side effects discussed.  eRx sent.  F/u 3 mos.  Pessary was cleaned and replaced today. Instructions given for care. Concerning symptoms to observe for are counseled to patient. Follow up scheduled for 3 months.  A total of 20 minutes were spent face-to-face with the patient as well as preparation, review, communication, and documentation during this encounter.   Barnett Applebaum, MD, [facility], [facility] 11/28/2019  8:12 AM

## 2019-11-28 NOTE — Patient Instructions (Signed)
Tolterodine extended-release capsules What is this medicine? TOLTERODINE (tole TER a deen) is used to treat overactive bladder. This medicine reduces the amount of bathroom visits. It may also help to control wetting accidents. This medicine may be used for other purposes; ask your health care provider or pharmacist if you have questions. COMMON BRAND NAME(S): Detrol LA What should I tell my health care provider before I take this medicine? They need to know if you have any of these conditions:  difficulty passing urine  glaucoma  intestinal obstruction  irregular heartbeat or you have a family member with irregular heartbeat  kidney disease  liver disease  myasthenia gravis  an unusual or allergic reaction to tolterodine, fesoterodine, other medicines, foods, dyes, or preservatives  pregnant or trying to get pregnant  breast-feeding How should I use this medicine? Take this medicine by mouth with a glass of water. Swallow whole, do not crush, cut, or chew. Follow the directions on the prescription label. Take your doses at regular intervals. Do not take your medicine more often than directed. Talk to your pediatrician regarding the use of this medicine in children. Special care may be needed. Overdosage: If you think you have taken too much of this medicine contact a poison control center or emergency room at once. NOTE: This medicine is only for you. Do not share this medicine with others. What if I miss a dose? If you miss a dose, take it as soon as you can. If it is almost time for your next dose, take only that dose. Do not take double or extra doses. What may interact with this medicine?  clarithromycin  cyclosporine  erythromycin  fluoxetine  medicines for fungal infections, like fluconazole, itraconazole, ketoconazole or voriconazole  medicines for memory problems like galantamine, donepezil, tacrine  vinblastine This list may not describe all possible  interactions. Give your health care provider a list of all the medicines, herbs, non-prescription drugs, or dietary supplements you use. Also tell them if you smoke, drink alcohol, or use illegal drugs. Some items may interact with your medicine. What should I watch for while using this medicine? It may take 2 or 3 months to notice the full benefit from this medicine. Your health care professional may also recommend techniques that may help improve control of your bladder and sphincter muscles. These techniques will help you need the bathroom less frequently. You may need to limit your intake tea, coffee, caffeinated sodas, and alcohol. These drinks may make your symptoms worse. Keeping healthy bowel habits may lessen bladder symptoms. If you currently smoke, quitting smoking may help reduce irritation to the bladder muscle. You may get drowsy or dizzy. Do not drive, use machinery, or do anything that needs mental alertness until you know how this drug affects you. Do not stand or sit up quickly, especially if you are an older patient. This reduces the risk of dizzy or fainting spells. Your mouth may get dry. Chewing sugarless gum or sucking hard candy, and drinking plenty of water, will help. This medicine may cause dry eyes and blurred vision. If you wear contact lenses you may feel some discomfort. Lubricating drops may help. See your eye doctor if the problem does not go away or is severe. What side effects may I notice from receiving this medicine? Side effects that you should report to your doctor or health care professional as soon as possible:  allergic reactions like skin rash, itching or hives, swelling of the face, lips, or tongue  breathing problems  confusion  difficulty passing urine  fast, irregular heartbeat  hallucinations  memory problems  swelling in feet, hands Side effects that usually do not require medical attention (report to your doctor or health care professional if  they continue or are bothersome):  changes in vision  constipation  dry eyes, mouth  headache  dizziness, drowsiness  stomach upset This list may not describe all possible side effects. Call your doctor for medical advice about side effects. You may report side effects to FDA at 1-800-FDA-1088. Where should I keep my medicine? Keep out of the reach of children. Store at room temperature between 15 and 30 degrees C (59 and 86 degrees F). Protect from light. Throw away any unused medicine after the expiration date. NOTE: This sheet is a summary. It may not cover all possible information. If you have questions about this medicine, talk to your doctor, pharmacist, or health care provider.  2020 Elsevier/Gold Standard (2010-06-04 17:20:26)

## 2019-12-01 ENCOUNTER — Telehealth: Payer: Self-pay

## 2019-12-01 MED ORDER — FESOTERODINE FUMARATE ER 4 MG PO TB24: 4.0000 mg | ORAL_TABLET | Freq: Every day | ORAL | 11 refills | Status: DC

## 2019-12-01 NOTE — Telephone Encounter (Signed)
Patient reports rx was too expensive. Pharmacy notified her they have contacted our office, but haven't received a rely. Pt requesting less expensive rx. Cb#[id number]

## 2019-12-01 NOTE — Telephone Encounter (Signed)
Will change medicine (although Detrol was listed as preferred status in Epic as covered by her ins) to [place].  Let us know if also too expensive and we will go to Oxybutynin.

## 2019-12-01 NOTE — Telephone Encounter (Signed)
Patient aware.

## 2020-02-27 ENCOUNTER — Other Ambulatory Visit: Payer: Self-pay | Admitting: Obstetrics & Gynecology

## 2020-02-27 ENCOUNTER — Other Ambulatory Visit: Payer: Self-pay | Admitting: Family Medicine

## 2020-02-27 DIAGNOSIS — Z1231 Encounter for screening mammogram for malignant neoplasm of breast: Secondary | ICD-10-CM

## 2020-03-05 ENCOUNTER — Other Ambulatory Visit: Payer: Self-pay

## 2020-03-05 ENCOUNTER — Encounter: Payer: Self-pay | Admitting: Obstetrics & Gynecology

## 2020-03-05 ENCOUNTER — Ambulatory Visit (INDEPENDENT_AMBULATORY_CARE_PROVIDER_SITE_OTHER): Payer: Medicare Other | Admitting: Obstetrics & Gynecology

## 2020-03-05 VITALS — BP 100/60 | Ht 65.0 in | Wt 143.0 lb

## 2020-03-05 DIAGNOSIS — N3281 Overactive bladder: Secondary | ICD-10-CM

## 2020-03-05 DIAGNOSIS — N812 Incomplete uterovaginal prolapse: Secondary | ICD-10-CM

## 2020-03-05 MED ORDER — OXYBUTYNIN CHLORIDE ER 10 MG PO TB24: 10.0000 mg | ORAL_TABLET | Freq: Every day | ORAL | 6 refills | Status: DC

## 2020-03-05 NOTE — Progress Notes (Signed)
HPI:      [name] is a 86 y.o. G2P2 who presents today for her pessary follow up and examination related to her pelvic floor weakening.  Pt reports tolerating the pessary well with  no vaginal bleeding and  no vaginal discharge.  Symptoms of pelvic floor weakening have greatly improved. She is voiding and defecating without difficulty. She currently has a Ring #5 pessary.  Could not take OAB meds due to cost (Detrol, Lisbeth Ply tried)  PMHx: She  has a past medical history of Breast cancer ([place]) (2002), Cancer ([place]) (2002), Hyperlipidemia, Personal history of radiation therapy (2002), and Thyroid disease (09/2019). Also,  has a past surgical history that includes Breast surgery (Right, 2002); Colonoscopy with propofol (N/A, 05/06/2017); and Breast biopsy (Right, 2002)., family history includes Breast cancer (age of onset: 73) in her maternal aunt and maternal grandmother; Cancer in her father, maternal uncle, paternal aunt, paternal grandmother, paternal uncle, and paternal uncle; Heart attack in her mother.,  reports that she has never smoked. She has never used smokeless tobacco. She reports that she does not drink alcohol and does not use drugs.  She has a current medication list which includes the following prescription(s): levothyroxine, famotidine, furosemide, oxybutynin, polyethylene glycol powder, and potassium chloride. Also, is allergic to latex.  Review of Systems  All other systems reviewed and are negative.   Objective: BP 100/60    Ht 5\' 5"  (1.651 m)    Wt 143 lb (64.9 kg)    BMI 23.80 kg/m  Physical Exam Constitutional:      General: She is not in acute distress.    Appearance: She is well-developed.  Genitourinary:     Pelvic exam was performed with patient supine.     Vagina and uterus normal.     No vaginal erythema or bleeding.     No cervical motion tenderness, discharge, polyp or nabothian cyst.     Uterus is mobile.     Uterus is not enlarged.     No uterine  mass detected.    Uterus is midaxial.     No right or left adnexal mass present.     Right adnexa not tender.     Left adnexa not tender.     Genitourinary Comments: Mild atrophy Cystocele gr 2 Uterine prolapse gr 2  HENT:     Head: Normocephalic and atraumatic.     Nose: Nose normal.  Abdominal:     General: There is no distension.     Palpations: Abdomen is soft.     Tenderness: There is no abdominal tenderness.  Musculoskeletal:        General: Normal range of motion.  Neurological:     Mental Status: She is alert and oriented to person, place, and time.     Cranial Nerves: No cranial nerve deficit.  Skin:    General: Skin is warm and dry.  Psychiatric:        Attention and Perception: Attention normal.        Mood and Affect: Mood and affect normal.        Speech: Speech normal.        Behavior: Behavior normal.        Thought Content: Thought content normal.        Judgment: Judgment normal.     Pessary Care Pessary removed and cleaned.  Vagina checked - without erosions - pessary replaced.  A/P:   ICD-10-CM   1. Cystocele with incomplete uterovaginal  prolapse  N81.2   2. Overactive bladder  N32.81    Pessary was cleaned and replaced today. Instructions given for care. Concerning symptoms to observe for are counseled to patient. Follow up scheduled for 3 months.  Will try Oxybutynin for OAB, see how it costs for pt.  A total of 20 minutes were spent face-to-face with the patient as well as preparation, review, communication, and documentation during this encounter.   Barnett Applebaum, MD, [facility], [facility] 03/05/2020  8:33 AM

## 2020-04-03 ENCOUNTER — Ambulatory Visit (INDEPENDENT_AMBULATORY_CARE_PROVIDER_SITE_OTHER): Payer: Medicare Other | Admitting: Family Medicine

## 2020-04-03 ENCOUNTER — Other Ambulatory Visit: Payer: Self-pay

## 2020-04-03 ENCOUNTER — Encounter: Payer: Self-pay | Admitting: Family Medicine

## 2020-04-03 VITALS — BP 160/79 | HR 67 | Temp 97.1°F | Resp 16 | Ht 65.0 in | Wt 141.0 lb

## 2020-04-03 DIAGNOSIS — N3000 Acute cystitis without hematuria: Secondary | ICD-10-CM

## 2020-04-03 DIAGNOSIS — B0230 Zoster ocular disease, unspecified: Secondary | ICD-10-CM

## 2020-04-03 DIAGNOSIS — R350 Frequency of micturition: Secondary | ICD-10-CM | POA: Diagnosis not present

## 2020-04-03 DIAGNOSIS — R3915 Urgency of urination: Secondary | ICD-10-CM

## 2020-04-03 LAB — POCT URINALYSIS DIPSTICK
Appearance: ABNORMAL
Bilirubin, UA: NEGATIVE
Blood, UA: NEGATIVE
Glucose, UA: NEGATIVE
Ketones, UA: NEGATIVE
Nitrite, UA: NEGATIVE
Odor: ABNORMAL
Protein, UA: NEGATIVE
Spec Grav, UA: 1.01 (ref 1.010–1.025)
Urobilinogen, UA: 0.2 E.U./dL
pH, UA: 6 (ref 5.0–8.0)

## 2020-04-03 MED ORDER — GABAPENTIN 100 MG PO CAPS: ORAL_CAPSULE | ORAL | 1 refills | Status: DC

## 2020-04-03 MED ORDER — NITROFURANTOIN MONOHYD MACRO 100 MG PO CAPS: 100.0000 mg | ORAL_CAPSULE | Freq: Two times a day (BID) | ORAL | 0 refills | Status: DC

## 2020-04-03 NOTE — Progress Notes (Signed)
Established patient visit  I,April Miller,acting as a scribe for Wilhemena Durie, MD.,have documented all relevant documentation on the behalf of Wilhemena Durie, MD,as directed by  Wilhemena Durie, MD while in the presence of Wilhemena Durie, MD.   Patient: Renee Powell   DOB: 08-29-1934   86 y.o. Female  MRN: [id number] Visit Date: 04/03/2020  Today's healthcare provider: Wilhemena Durie, MD   Chief Complaint  Patient presents with  . Herpes Zoster   Subjective    HPI  Patient is here concerning shingles she has around her eyes. Patient has had shingles around eyes for about 1 month. Patient is being treated for shingles at [facility]. Patient's optometrist suggest she see her pcp to discuss a medication to help with her pain.  Initial pain was 10/10. Now 8/10. Vision better--followed by Dr Murvin Natal.     Medications: Outpatient Medications Prior to Visit  Medication Sig  . AK-POLY-BAC 500-10000 UNIT/GM OINT Apply to eye.  . COMBIGAN 0.2-0.5 % ophthalmic solution   . DUREZOL 0.05 % EMUL Place 1 drop into the left eye 2 (two) times daily.  Marland Kitchen ketotifen (ZADITOR) 0.025 % ophthalmic solution 1 drop 2 (two) times daily.  Marland Kitchen levothyroxine (SYNTHROID, LEVOTHROID) 88 MCG tablet Take 88 mcg daily before breakfast by mouth.  . valACYclovir (VALTREX) 1000 MG tablet Take by mouth.  . famotidine (PEPCID) 20 MG tablet Take 1 tablet (20 mg total) by mouth daily. (Patient not taking: Reported on 03/05/2020)  . furosemide (LASIX) 20 MG tablet Take 1 tablet (20 mg total) by mouth daily. (Patient not taking: Reported on 03/05/2020)  . oxybutynin (DITROPAN-XL) 10 MG 24 hr tablet Take 1 tablet (10 mg total) by mouth at bedtime. (Patient not taking: Reported on 04/03/2020)  . polyethylene glycol powder (GLYCOLAX/MIRALAX) powder 17 grams a day as needed if constipated (Patient not taking: Reported on 03/05/2020)  . potassium chloride (K-DUR) 10 MEQ tablet Take 1 tablet (10  mEq total) by mouth 2 (two) times daily. (Patient not taking: Reported on 03/05/2020)   No facility-administered medications prior to visit.    Review of Systems  Constitutional: Negative for appetite change, chills, fatigue and fever.  Respiratory: Negative for chest tightness and shortness of breath.   Cardiovascular: Negative for chest pain and palpitations.  Gastrointestinal: Negative for abdominal pain, nausea and vomiting.  Neurological: Negative for dizziness and weakness.       Objective    BP (!) 160/79 (BP Location: Left Arm, Patient Position: Sitting, Cuff Size: Large)   Pulse 67   Temp (!) 97.1 F (36.2 C) (Other (Comment))   Resp 16   Ht 5\' 5"  (1.651 m)   Wt 141 lb (64 kg)   SpO2 98%   BMI 23.46 kg/m     Physical Exam Vitals reviewed. Exam conducted with a chaperone present.  Constitutional:      Appearance: Normal appearance.  HENT:     Head: Normocephalic and atraumatic.     Right Ear: External ear normal.     Left Ear: External ear normal.     Nose: Nose normal.  Eyes:     General: No scleral icterus.    Conjunctiva/sclera: Conjunctivae normal.  Cardiovascular:     Rate and Rhythm: Normal rate and regular rhythm.     Heart sounds: Normal heart sounds.  Pulmonary:     Effort: Pulmonary effort is normal.  Chest:     Breasts:  Right: Inverted nipple present. No swelling, bleeding, mass, nipple discharge or skin change.        Left: Inverted nipple present. No swelling or nipple discharge.     Comments: Right breast: Thickening right areolar from 10 oclok to 2 oclock.  Abdominal:     Palpations: Abdomen is soft. There is no mass.     Tenderness: There is no abdominal tenderness.  Musculoskeletal:        General: No swelling.  Skin:    General: Skin is warm and dry.     Comments: Mild scarring of left forehead from shingles.  Neurological:     General: No focal deficit present.     Mental Status: She is alert and oriented to person, place,  and time.  Psychiatric:        Mood and Affect: Mood normal.        Behavior: Behavior normal.        Thought Content: Thought content normal.        Judgment: Judgment normal.       No results found for any visits on 04/03/20.  Assessment & Plan     1. Herpes zoster with ophthalmic complication, unspecified herpes zoster eye disease RTC 3 weeks. - gabapentin (NEURONTIN) 100 MG capsule; 1 tablet at bedtime for 1 week, Then add 1 tablet in the morning for 1 week, Then add 1 tablet in the afternoon  Dispense: 90 capsule; Refill: 1  2. Frequency of urination  - POCT urinalysis dipstick  3. Urgency of urination  - POCT urinalysis dipstick  4. Acute cystitis without hematuria  - nitrofurantoin, macrocrystal-monohydrate, (MACROBID) 100 MG capsule; Take 1 capsule (100 mg total) by mouth 2 (two) times daily.  Dispense: 6 capsule; Refill: 0 - CULTURE, URINE COMPREHENSIVE    Return in about 3 weeks (around 04/24/2020).         Roseland Braun Cranford Mon, MD  [facility] [phone] (phone) [phone] (fax)  [place]

## 2020-04-07 LAB — CULTURE, URINE COMPREHENSIVE

## 2020-04-09 ENCOUNTER — Telehealth: Payer: Self-pay

## 2020-04-09 NOTE — Telephone Encounter (Signed)
Patient advised.

## 2020-04-09 NOTE — Telephone Encounter (Signed)
-----   Message from Jerrol Banana., MD sent at 04/07/2020 11:54 AM EDT ----- Urine normal.

## 2020-04-10 ENCOUNTER — Telehealth: Payer: Self-pay

## 2020-04-10 NOTE — Telephone Encounter (Signed)
Just stop it.

## 2020-04-10 NOTE — Telephone Encounter (Signed)
Patient advised that it is okay for her to stop taking gabapentin per Dr. Rosanna Randy

## 2020-04-10 NOTE — Telephone Encounter (Signed)
Copied from [place] [id number]. Topic: General - Inquiry >> Apr 10, 2020  1:01 PM Greggory Keen D wrote: Reason for CRM: pt called saying she thinks she is having a reaction to the Gabapentin.  She is having bluury vision, weak, back pain,  She wants to discontinue it  CB #  [phone]

## 2020-04-23 NOTE — Progress Notes (Signed)
I,April Miller,acting as a scribe for Wilhemena Durie, MD.,have documented all relevant documentation on the behalf of Wilhemena Durie, MD,as directed by  Wilhemena Durie, MD while in the presence of Wilhemena Durie, MD.   Established patient visit   Patient: Renee Powell   DOB: 11/23/1933   86 y.o. Female  MRN: [id number] Visit Date: 04/24/2020  Today's healthcare provider: Wilhemena Durie, MD   Chief Complaint  Patient presents with  . Follow-up  . Herpes Zoster   Subjective    HPI  Pain is improving.  Patient does not tolerate gabapentin very well. She has having some mild dysuria on occasion. She follows up with Dr. Ronnald Collum for thyroid conditions but wants no further medical evaluation or care. Herpes zoster with ophthalmic complication, unspecified herpes zoster eye disease From 04/03/2020-advised to follow up in 3 weeks. Given rx for gabapentin (NEURONTIN) 100 mg capsule.  Patient states she is having pain from shingles. She saw optometrist last week and has another visit scheduled 05/04/2020.      Medications: Outpatient Medications Prior to Visit  Medication Sig  . AK-POLY-BAC 500-10000 UNIT/GM OINT Apply to eye.  . COMBIGAN 0.2-0.5 % ophthalmic solution   . DUREZOL 0.05 % EMUL Place 1 drop into the left eye 2 (two) times daily.  . famotidine (PEPCID) 20 MG tablet Take 1 tablet (20 mg total) by mouth daily. (Patient not taking: Reported on 03/05/2020)  . furosemide (LASIX) 20 MG tablet Take 1 tablet (20 mg total) by mouth daily. (Patient not taking: Reported on 03/05/2020)  . gabapentin (NEURONTIN) 100 MG capsule 1 tablet at bedtime for 1 week, Then add 1 tablet in the morning for 1 week, Then add 1 tablet in the afternoon  . ketotifen (ZADITOR) 0.025 % ophthalmic solution 1 drop 2 (two) times daily.  Marland Kitchen levothyroxine (SYNTHROID, LEVOTHROID) 88 MCG tablet Take 88 mcg daily before breakfast by mouth.  . nitrofurantoin, macrocrystal-monohydrate,  (MACROBID) 100 MG capsule Take 1 capsule (100 mg total) by mouth 2 (two) times daily. (Patient not taking: Reported on 04/24/2020)  . oxybutynin (DITROPAN-XL) 10 MG 24 hr tablet Take 1 tablet (10 mg total) by mouth at bedtime. (Patient not taking: Reported on 04/03/2020)  . polyethylene glycol powder (GLYCOLAX/MIRALAX) powder 17 grams a day as needed if constipated (Patient not taking: Reported on 03/05/2020)  . potassium chloride (K-DUR) 10 MEQ tablet Take 1 tablet (10 mEq total) by mouth 2 (two) times daily. (Patient not taking: Reported on 03/05/2020)  . valACYclovir (VALTREX) 1000 MG tablet Take by mouth.   No facility-administered medications prior to visit.    Review of Systems  Constitutional: Negative for appetite change, chills, fatigue and fever.  Respiratory: Negative for chest tightness and shortness of breath.   Cardiovascular: Negative for chest pain and palpitations.  Gastrointestinal: Negative for abdominal pain, nausea and vomiting.  Neurological: Negative for dizziness and weakness.    Last metabolic panel Lab Results  Component Value Date   GLUCOSE 94 04/24/2020   NA 144 04/24/2020   K 4.4 04/24/2020   CL 106 04/24/2020   CO2 27 04/24/2020   BUN 17 04/24/2020   CREATININE 0.91 04/24/2020   GFRNONAA 58 (L) 04/24/2020   GFRAA 67 04/24/2020   CALCIUM 9.7 04/24/2020   PROT 6.0 04/24/2020   ALBUMIN 3.9 04/24/2020   LABGLOB 2.1 04/24/2020   AGRATIO 1.9 04/24/2020   BILITOT 0.6 04/24/2020   ALKPHOS 70 04/24/2020   AST 11 04/24/2020  ALT 7 04/24/2020   ANIONGAP 7 05/13/2017   Last thyroid functions Lab Results  Component Value Date   TSH 2.382 05/04/2017      Objective    BP 125/77 (BP Location: Right Arm, Patient Position: Sitting, Cuff Size: Large)   Pulse 60   Temp 99 F (37.2 C) (Oral)   Resp 18   Ht 5\' 5"  (1.651 m)   Wt 139 lb (63 kg)   SpO2 97%   BMI 23.13 kg/m  BP Readings from Last 3 Encounters:  04/24/20 125/77  04/03/20 (!) 160/79    03/05/20 100/60   Wt Readings from Last 3 Encounters:  04/24/20 139 lb (63 kg)  04/03/20 141 lb (64 kg)  03/05/20 143 lb (64.9 kg)      Physical Exam   General: Appearance:    Well developed, well nourished female in no acute distress  Eyes:    PERRL, conjunctiva/corneas clear, EOM's intact       Lungs:     Clear to auscultation bilaterally, respirations unlabored  Heart:    Normal heart rate. Normal rhythm. No murmurs, rubs, or gallops.   MS:   All extremities are intact.   Neurologic:   Awake, alert, oriented x 3. No apparent focal neurological           defect.         No results found for any visits on 04/24/20.  Assessment & Plan     1. Herpes zoster with ophthalmic complication, unspecified herpes zoster eye disease Followed by ophthalmology.  Call patient and her son that she can stop the gabapentin in the future when the pain is better.  It is markedly improved already. - CBC w/Diff/Platelet - Comprehensive Metabolic Panel (CMET)  2. Hypothyroidism, unspecified type  - CBC w/Diff/Platelet - Comprehensive Metabolic Panel (CMET)  3. Acute cystitis without hematuria Check urine culture - CBC w/Diff/Platelet - Comprehensive Metabolic Panel (CMET)  4. Neuropathy Apparently idiopathic at this time. - CBC w/Diff/Platelet - Comprehensive Metabolic Panel (CMET)   Return in 6 months (on 10/25/2020) for AWV and CPE.          Prestyn Stanco Cranford Mon, MD  [facility] [phone] (phone) [phone] (fax)  [place]

## 2020-04-24 ENCOUNTER — Other Ambulatory Visit: Payer: Self-pay

## 2020-04-24 ENCOUNTER — Ambulatory Visit (INDEPENDENT_AMBULATORY_CARE_PROVIDER_SITE_OTHER): Payer: Medicare Other | Admitting: Family Medicine

## 2020-04-24 VITALS — BP 125/77 | HR 60 | Temp 99.0°F | Resp 18 | Ht 65.0 in | Wt 139.0 lb

## 2020-04-24 DIAGNOSIS — E039 Hypothyroidism, unspecified: Secondary | ICD-10-CM | POA: Diagnosis not present

## 2020-04-24 DIAGNOSIS — G629 Polyneuropathy, unspecified: Secondary | ICD-10-CM

## 2020-04-24 DIAGNOSIS — B0230 Zoster ocular disease, unspecified: Secondary | ICD-10-CM

## 2020-04-24 DIAGNOSIS — N3000 Acute cystitis without hematuria: Secondary | ICD-10-CM | POA: Diagnosis not present

## 2020-04-25 LAB — COMPREHENSIVE METABOLIC PANEL WITH GFR
ALT: 7 IU/L (ref 0–32)
AST: 11 IU/L (ref 0–40)
Albumin/Globulin Ratio: 1.9 (ref 1.2–2.2)
Albumin: 3.9 g/dL (ref 3.6–4.6)
Alkaline Phosphatase: 70 IU/L (ref 48–121)
BUN/Creatinine Ratio: 19 (ref 12–28)
BUN: 17 mg/dL (ref 8–27)
Bilirubin Total: 0.6 mg/dL (ref 0.0–1.2)
CO2: 27 mmol/L (ref 20–29)
Calcium: 9.7 mg/dL (ref 8.7–10.3)
Chloride: 106 mmol/L (ref 96–106)
Creatinine, Ser: 0.91 mg/dL (ref 0.57–1.00)
GFR calc Af Amer: 67 mL/min/1.73
GFR calc non Af Amer: 58 mL/min/1.73 — ABNORMAL LOW
Globulin, Total: 2.1 g/dL (ref 1.5–4.5)
Glucose: 94 mg/dL (ref 65–99)
Potassium: 4.4 mmol/L (ref 3.5–5.2)
Sodium: 144 mmol/L (ref 134–144)
Total Protein: 6 g/dL (ref 6.0–8.5)

## 2020-04-25 LAB — CBC WITH DIFFERENTIAL/PLATELET
Basophils Absolute: 0.1 10*3/uL (ref 0.0–0.2)
Basos: 1 %
EOS (ABSOLUTE): 0.2 10*3/uL (ref 0.0–0.4)
Eos: 2 %
Hematocrit: 38.8 % (ref 34.0–46.6)
Hemoglobin: 12.6 g/dL (ref 11.1–15.9)
Immature Grans (Abs): 0 10*3/uL (ref 0.0–0.1)
Immature Granulocytes: 0 %
Lymphocytes Absolute: 1.6 10*3/uL (ref 0.7–3.1)
Lymphs: 22 %
MCH: 29.9 pg (ref 26.6–33.0)
MCHC: 32.5 g/dL (ref 31.5–35.7)
MCV: 92 fL (ref 79–97)
Monocytes Absolute: 0.6 10*3/uL (ref 0.1–0.9)
Monocytes: 8 %
Neutrophils Absolute: 4.7 10*3/uL (ref 1.4–7.0)
Neutrophils: 67 %
Platelets: 222 10*3/uL (ref 150–450)
RBC: 4.22 x10E6/uL (ref 3.77–5.28)
RDW: 14 % (ref 11.7–15.4)
WBC: 7.1 10*3/uL (ref 3.4–10.8)

## 2020-04-27 ENCOUNTER — Telehealth: Payer: Self-pay

## 2020-04-27 NOTE — Telephone Encounter (Signed)
-----   Message from Jerrol Banana., MD sent at 04/26/2020 11:39 AM EDT ----- Labs all normal.

## 2020-04-27 NOTE — Telephone Encounter (Signed)
Patient advised of lab results

## 2020-05-29 ENCOUNTER — Ambulatory Visit
Admission: RE | Admit: 2020-05-29 | Discharge: 2020-05-29 | Disposition: A | Discharge status: Home / Self Care | Payer: Medicare Other | Source: Ambulatory Visit | Attending: Obstetrics & Gynecology | Admitting: Obstetrics & Gynecology

## 2020-05-29 ENCOUNTER — Encounter: Payer: Self-pay | Admitting: Obstetrics & Gynecology

## 2020-05-29 DIAGNOSIS — Z1231 Encounter for screening mammogram for malignant neoplasm of breast: Secondary | ICD-10-CM | POA: Insufficient documentation

## 2020-06-05 ENCOUNTER — Encounter: Payer: Self-pay | Admitting: Obstetrics & Gynecology

## 2020-06-05 ENCOUNTER — Other Ambulatory Visit: Payer: Self-pay

## 2020-06-05 ENCOUNTER — Ambulatory Visit (INDEPENDENT_AMBULATORY_CARE_PROVIDER_SITE_OTHER): Payer: Medicare Other | Admitting: Obstetrics & Gynecology

## 2020-06-05 ENCOUNTER — Ambulatory Visit: Payer: Medicare Other | Admitting: Obstetrics & Gynecology

## 2020-06-05 VITALS — BP 140/80 | Ht 60.0 in | Wt 140.0 lb

## 2020-06-05 DIAGNOSIS — N3281 Overactive bladder: Secondary | ICD-10-CM

## 2020-06-05 DIAGNOSIS — N812 Incomplete uterovaginal prolapse: Secondary | ICD-10-CM | POA: Diagnosis not present

## 2020-06-05 NOTE — Progress Notes (Signed)
HPI:      [name] is a 86 y.o. G2P2 who presents today for her pessary follow up and examination related to her pelvic floor weakening.  Pt reports tolerating the pessary well with  no vaginal bleeding and  no vaginal discharge.  Symptoms of pelvic floor weakening have greatly improved. She is voiding and defecating without difficulty. She currently has a Ring #5 pessary.  Has not tried Oxybutynin yet (has been dealing w long episode of Shingles).  PMHx: She  has a past medical history of Breast cancer ([place]) (2002), Cancer ([place]) (2002), Hyperlipidemia, Personal history of radiation therapy (2002), Shingles of eyelid, and Thyroid disease (09/2019). Also,  has a past surgical history that includes Breast surgery (Right, 2002); Colonoscopy with propofol (N/A, 05/06/2017); and Breast biopsy (Right, 2002)., family history includes Breast cancer (age of onset: 32) in her maternal aunt and maternal grandmother; Cancer in her father, maternal uncle, paternal aunt, paternal grandmother, paternal uncle, and paternal uncle; Heart attack in her mother.,  reports that she has never smoked. She has never used smokeless tobacco. She reports that she does not drink alcohol and does not use drugs.  She has a current medication list which includes the following prescription(s): ak-poly-bac, combigan, durezol, famotidine, furosemide, gabapentin, ketotifen, levothyroxine, nitrofurantoin (macrocrystal-monohydrate), oxybutynin, polyethylene glycol powder, potassium chloride, and valacyclovir. Also, is allergic to latex.  Review of Systems  All other systems reviewed and are negative.   Objective: BP 140/80   Ht 5' (1.524 m)   Wt 140 lb (63.5 kg)   BMI 27.34 kg/m  Physical Exam Constitutional:      General: She is not in acute distress.    Appearance: She is well-developed.  Genitourinary:     Pelvic exam was performed with patient supine.     Vagina and uterus normal.     No vaginal erythema or  bleeding.     No cervical motion tenderness, discharge, polyp or nabothian cyst.     Uterus is mobile.     Uterus is not enlarged.     No uterine mass detected.    Uterus is midaxial.     No right or left adnexal mass present.     Right adnexa not tender.     Left adnexa not tender.     Genitourinary Comments: Uterine prolapse and cystocele present  HENT:     Head: Normocephalic and atraumatic.     Nose: Nose normal.  Abdominal:     General: There is no distension.     Palpations: Abdomen is soft.     Tenderness: There is no abdominal tenderness.  Musculoskeletal:        General: Normal range of motion.  Neurological:     Mental Status: She is alert and oriented to person, place, and time.     Cranial Nerves: No cranial nerve deficit.  Skin:    General: Skin is warm and dry.  Psychiatric:        Attention and Perception: Attention normal.        Mood and Affect: Mood and affect normal.        Speech: Speech normal.        Behavior: Behavior normal.        Thought Content: Thought content normal.        Judgment: Judgment normal.     Pessary Care Pessary removed and cleaned.  Vagina checked - without erosions - pessary replaced.  A/P:   ICD-10-CM  1. Cystocele with incomplete uterovaginal prolapse  N81.2   2. Overactive bladder  N32.81    Pessary was cleaned and replaced today. Instructions given for care. Concerning symptoms to observe for are counseled to patient. Follow up scheduled for 3 months.  A total of 20 minutes were spent face-to-face with the patient as well as preparation, review, communication, and documentation during this encounter.   Barnett Applebaum, MD, [facility], [facility] 06/05/2020  1:57 PM

## 2020-09-05 ENCOUNTER — Other Ambulatory Visit: Payer: Self-pay

## 2020-09-05 ENCOUNTER — Encounter: Payer: Self-pay | Admitting: Obstetrics & Gynecology

## 2020-09-05 ENCOUNTER — Ambulatory Visit (INDEPENDENT_AMBULATORY_CARE_PROVIDER_SITE_OTHER): Payer: Medicare Other | Admitting: Obstetrics & Gynecology

## 2020-09-05 VITALS — BP 130/80 | Ht 65.0 in | Wt 144.0 lb

## 2020-09-05 DIAGNOSIS — N812 Incomplete uterovaginal prolapse: Secondary | ICD-10-CM | POA: Diagnosis not present

## 2020-09-05 DIAGNOSIS — N3281 Overactive bladder: Secondary | ICD-10-CM | POA: Diagnosis not present

## 2020-09-05 NOTE — Progress Notes (Signed)
HPI:      [name] is a 86 y.o. G2P2 who presents today for her pessary follow up and examination related to her pelvic floor weakening.  Pt reports tolerating the pessary well with no vaginal bleeding and no vaginal discharge.  Symptoms of pelvic floor weakening have greatly improved. She is voiding and defecating without difficulty. She currently has a Ring #5 pessary.  PMHx: She  has a past medical history of Breast cancer ([place]) (2002), Cancer ([place]) (2002), Hyperlipidemia, Personal history of radiation therapy (2002), Shingles of eyelid, and Thyroid disease (09/2019). Also,  has a past surgical history that includes Breast surgery (Right, 2002); Colonoscopy with propofol (N/A, 05/06/2017); and Breast biopsy (Right, 2002)., family history includes Breast cancer (age of onset: 69) in her maternal aunt and maternal grandmother; Cancer in her father, maternal uncle, paternal aunt, paternal grandmother, paternal uncle, and paternal uncle; Heart attack in her mother.,  reports that she has never smoked. She has never used smokeless tobacco. She reports that she does not drink alcohol and does not use drugs.  She has a current medication list which includes the following prescription(s): ak-poly-bac, combigan, durezol, famotidine, furosemide, gabapentin, ketotifen, levothyroxine, nitrofurantoin (macrocrystal-monohydrate), oxybutynin, polyethylene glycol powder, potassium chloride, and valacyclovir. Also, is allergic to latex.  Review of Systems  All other systems reviewed and are negative.   Objective: BP 130/80   Ht 5\' 5"  (1.651 m)   Wt 144 lb (65.3 kg)   BMI 23.96 kg/m  Physical Exam Constitutional:      General: She is not in acute distress.    Appearance: She is well-developed.  Genitourinary:     Bladder, vagina and uterus normal.     No vaginal erythema or bleeding.      Right Adnexa: not tender and no mass present.    Left Adnexa: not tender and no mass present.    No  cervical motion tenderness, discharge, polyp or nabothian cyst.     Uterus is mobile and prolapsed.     Uterus is not enlarged.     No uterine mass detected.    Uterus is midaxial.     Bladder exam comments: Gr 1 cystocele.     Pelvic exam was performed with patient supine.  HENT:     Head: Normocephalic and atraumatic.     Nose: Nose normal.  Abdominal:     General: There is no distension.     Palpations: Abdomen is soft.     Tenderness: There is no abdominal tenderness.  Musculoskeletal:        General: Normal range of motion.  Neurological:     Mental Status: She is alert and oriented to person, place, and time.     Cranial Nerves: No cranial nerve deficit.  Skin:    General: Skin is warm and dry.  Psychiatric:        Attention and Perception: Attention normal.        Mood and Affect: Mood and affect normal.        Speech: Speech normal.        Behavior: Behavior normal.        Thought Content: Thought content normal.        Judgment: Judgment normal.     Pessary Care Pessary removed and cleaned.  Vagina checked - without erosions - pessary replaced.  A/P:1. Cystocele with incomplete uterovaginal prolapse 2. Overactive bladder Pessary was cleaned and replaced today. Instructions given for care. Concerning symptoms to observe  for are counseled to patient. Follow up scheduled for 3 months.  A total of 20 minutes were spent face-to-face with the patient as well as preparation, review, communication, and documentation during this encounter.   Annamarie Major, MD, [facility], [facility] 09/05/2020  2:10 PM

## 2020-09-06 ENCOUNTER — Ambulatory Visit: Payer: Medicare Other | Admitting: Obstetrics & Gynecology

## 2020-10-24 NOTE — Progress Notes (Signed)
Subjective:   Renee Powell is a 87 y.o. female who presents for Medicare Annual (Subsequent) preventive examination.  Review of Systems    N/A  Cardiac Risk Factors include: advanced age (>77men, >7 women)     Objective:    Today's Vitals   10/25/20 0857  BP: 122/76  Pulse: 72  Temp: 99.1 F (37.3 C)  TempSrc: Oral  SpO2: 100%  Weight: 143 lb 3.2 oz (65 kg)  Height: 5\' 5"  (1.651 m)  PainSc: 5   PainLoc: Eye   Body mass index is 23.83 kg/m.  Advanced Directives 10/25/2020 03/25/2018 05/03/2017 05/03/2017 05/01/2017 03/24/2016 03/22/2015  Does Patient Have a Medical Advance Directive? Yes Yes Yes Yes No No Yes  Type of Paramedic of [place];Living will [place];Living will [place];Living will [place];Living will - - [place];Living will  Does patient want to make changes to medical advance directive? - - No - Patient declined No - Patient declined - - No - Patient declined  Copy of [place] in Chart? Yes - validated most recent copy scanned in chart (See row information) Yes No - copy requested No - copy requested - - Yes  Would patient like information on creating a medical advance directive? - - No - Patient declined No - Patient declined No - Patient declined No - patient declined information -    Current Medications (verified) Outpatient Encounter Medications as of 10/25/2020  Medication Sig  . carboxymethylcellulose (REFRESH TEARS) 0.5 % SOLN 1 drop 3 (three) times daily as needed.  Marland Kitchen levothyroxine (SYNTHROID, LEVOTHROID) 88 MCG tablet Take 88 mcg daily before breakfast by mouth.  . AK-POLY-BAC 500-10000 UNIT/GM OINT Apply to eye. (Patient not taking: Reported on 10/25/2020)  . COMBIGAN 0.2-0.5 % ophthalmic solution  (Patient not taking: Reported on 10/25/2020)  . DUREZOL 0.05 % EMUL Place 1 drop into the left eye 2 (two) times daily. (Patient not  taking: Reported on 10/25/2020)  . famotidine (PEPCID) 20 MG tablet Take 1 tablet (20 mg total) by mouth daily. (Patient not taking: Reported on 10/25/2020)  . furosemide (LASIX) 20 MG tablet Take 1 tablet (20 mg total) by mouth daily. (Patient not taking: Reported on 10/25/2020)  . gabapentin (NEURONTIN) 100 MG capsule 1 tablet at bedtime for 1 week, Then add 1 tablet in the morning for 1 week, Then add 1 tablet in the afternoon (Patient not taking: Reported on 10/25/2020)  . ketotifen (ZADITOR) 0.025 % ophthalmic solution 1 drop 2 (two) times daily. (Patient not taking: Reported on 10/25/2020)  . nitrofurantoin, macrocrystal-monohydrate, (MACROBID) 100 MG capsule Take 1 capsule (100 mg total) by mouth 2 (two) times daily. (Patient not taking: Reported on 10/25/2020)  . oxybutynin (DITROPAN-XL) 10 MG 24 hr tablet Take 1 tablet (10 mg total) by mouth at bedtime. (Patient not taking: Reported on 10/25/2020)  . polyethylene glycol powder (GLYCOLAX/MIRALAX) powder 17 grams a day as needed if constipated (Patient not taking: Reported on 10/25/2020)  . potassium chloride (K-DUR) 10 MEQ tablet Take 1 tablet (10 mEq total) by mouth 2 (two) times daily. (Patient not taking: Reported on 10/25/2020)  . prednisoLONE acetate (PRED FORTE) 1 % ophthalmic suspension Place 1 drop into the left eye 4 (four) times daily.  . valACYclovir (VALTREX) 1000 MG tablet Take by mouth. (Patient not taking: Reported on 10/25/2020)   No facility-administered encounter medications on file as of 10/25/2020.    Allergies (verified) Latex  History: Past Medical History:  Diagnosis Date  . Breast cancer [facility]) 2002   right lumpectomy with radiation  . Cancer [facility]) 2002   breast  . Hyperlipidemia   . Personal history of radiation therapy 2002   Right breast  . Shingles of eyelid   . Thyroid disease 09/2019   Pt had tumors treated with radiation therapy   Past Surgical History:  Procedure Laterality Date  . BREAST BIOPSY Right  2002   breast ca radation  . BREAST SURGERY Right 2002   lumpectomy  . COLONOSCOPY WITH PROPOFOL N/A 05/06/2017   Procedure: COLONOSCOPY WITH PROPOFOL;  Surgeon: [name], MD;  Location: [facility] ENDOSCOPY;  Service: Gastroenterology;  Laterality: N/A;   Family History  Problem Relation Age of Onset  . Cancer Father        lung cancer  . Cancer Maternal Uncle   . Cancer Paternal Aunt        Breast  . Cancer Paternal Uncle        Kidney  . Cancer Paternal Uncle        Lung  . Heart attack Mother   . Cancer Paternal Grandmother        Breast  . Breast cancer Maternal Aunt 60  . Breast cancer Maternal Grandmother 15   Social History   Socioeconomic History  . Marital status: Widowed    Spouse name: Not on file  . Number of children: 2  . Years of education: Not on file  . Highest education level: 12th grade  Occupational History  . Occupation: retired  Tobacco Use  . Smoking status: Never Smoker  . Smokeless tobacco: Never Used  Vaping Use  . Vaping Use: Never used  Substance and Sexual Activity  . Alcohol use: No    Alcohol/week: 0.0 standard drinks  . Drug use: No  . Sexual activity: Not Currently  Other Topics Concern  . Not on file  Social History Narrative  . Not on file   Social Determinants of Health   Financial Resource Strain: Low Risk   . Difficulty of Paying Living Expenses: Not hard at all  Food Insecurity: No Food Insecurity  . Worried About Charity fundraiser in the Last Year: Never true  . Ran Out of Food in the Last Year: Never true  Transportation Needs: No Transportation Needs  . Lack of Transportation (Medical): No  . Lack of Transportation (Non-Medical): No  Physical Activity: Inactive  . Days of Exercise per Week: 0 days  . Minutes of Exercise per Session: 0 min  Stress: Stress Concern Present  . Feeling of Stress : To some extent  Social Connections: Moderately Isolated  . Frequency of Communication with Friends and Family: More than  three times a week  . Frequency of Social Gatherings with Friends and Family: More than three times a week  . Attends Religious Services: More than 4 times per year  . Active Member of Clubs or Organizations: No  . Attends Archivist Meetings: Never  . Marital Status: Widowed    Tobacco Counseling Counseling given: Not Answered   Clinical Intake:  Pre-visit preparation completed: Yes  Pain : 0-10 Pain Score: 5  Pain Type: Acute pain Pain Location: Eye Pain Orientation: Left Pain Descriptors / Indicators: Burning Pain Frequency: Intermittent Pain Relieving Factors: Uses Refresh Tear drops as needed for dry eyes.  Pain Relieving Factors: Uses Refresh Tear drops as needed for dry eyes.  Nutritional Status: BMI of 19-24  Normal Nutritional Risks: None Diabetes: No  How often do you need to have someone help you when you read instructions, pamphlets, or other written materials from your doctor or pharmacy?: 1 - Never  Diabetic? No  Interpreter Needed?: No  Information entered by :: [facility], LPN   Activities of Daily Living In your present state of health, do you have any difficulty performing the following activities: 10/25/2020  Hearing? Y  Comment Does not wear hearing aids.  Vision? Y  Comment Due to shingles issues in left eye and sensitivity.  Difficulty concentrating or making decisions? N  Walking or climbing stairs? N  Dressing or bathing? N  Doing errands, shopping? N  Preparing Food and eating ? N  Using the Toilet? N  In the past six months, have you accidently leaked urine? Y  Comment Occasionally at night with urges.  Do you have problems with loss of bowel control? N  Managing your Medications? N  Managing your Finances? N  Housekeeping or managing your Housekeeping? N  Some recent data might be hidden    Patient Care Team: Jerrol Banana., MD as PCP - General (Family Medicine) Ronnald Collum, Lourdes Sledge, MD as Attending Physician  (Endocrinology) Gae Dry, MD as Referring Physician (Obstetrics and Gynecology) Leandrew Koyanagi, MD as Referring Physician (Ophthalmology)  Indicate any recent Medical Services you may have received from other than [facility] providers in the past year (date may be approximate).     Assessment:   This is a routine wellness examination for Shantara.  Hearing/Vision screen No exam data present  Dietary issues and exercise activities discussed: Current Exercise Habits: The patient does not participate in regular exercise at present, Exercise limited by: None identified  Goals    . DIET - INCREASE WATER INTAKE     Recommend increasing water intake to 3 glasses a day.       Depression Screen PHQ 2/9 Scores 10/25/2020 03/25/2018 05/14/2017 02/13/2016 02/13/2016 07/11/2015  PHQ - 2 Score 2 0 0 0 0 0  PHQ- 9 Score 6 - - - - -    Fall Risk Fall Risk  10/25/2020 03/25/2018 05/14/2017 02/13/2016 02/13/2016  Falls in the past year? 0 Yes No Yes No  Number falls in past yr: 0 1 - 1 -  Injury with Fall? 0 No - No -  Follow up - Falls prevention discussed - - -    FALL RISK PREVENTION PERTAINING TO THE HOME:  Any stairs in or around the home? No  If so, are there any without handrails? No  Home free of loose throw rugs in walkways, pet beds, electrical cords, etc? Yes  Adequate lighting in your home to reduce risk of falls? Yes   ASSISTIVE DEVICES UTILIZED TO PREVENT FALLS:  Life alert? No  Use of a cane, walker or w/c? No  Grab bars in the bathroom? Yes  Shower chair or bench in shower? Yes  Elevated toilet seat or a handicapped toilet? Yes   TIMED UP AND GO:  Was the test performed? Yes .  Length of time to ambulate 10 feet: 9 sec.   Gait steady and fast without use of assistive device  Cognitive Function: Normal cognitive status assessed by direct observation by this Nurse Health Advisor. No abnormalities found.          Immunizations Immunization History  Administered  Date(s) Administered  . PFIZER(Purple Top)SARS-COV-2 Vaccination 11/09/2019, 11/30/2019    TDAP status: Due, Education has been provided regarding the  importance of this vaccine. Advised may receive this vaccine at local pharmacy or Health Dept. Aware to provide a copy of the vaccination record if obtained from local pharmacy or Health Dept. Verbalized acceptance and understanding.  Flu Vaccine status: Declined, Education has been provided regarding the importance of this vaccine but patient still declined. Advised may receive this vaccine at local pharmacy or Health Dept. Aware to provide a copy of the vaccination record if obtained from local pharmacy or Health Dept. Verbalized acceptance and understanding.  Pneumococcal vaccine status: Declined,  Education has been provided regarding the importance of this vaccine but patient still declined. Advised may receive this vaccine at local pharmacy or Health Dept. Aware to provide a copy of the vaccination record if obtained from local pharmacy or Health Dept. Verbalized acceptance and understanding.   Covid-19 vaccine status: Completed vaccines  Qualifies for Shingles Vaccine? Yes   Zostavax completed No   Shingrix Completed?: No.    Education has been provided regarding the importance of this vaccine. Patient has been advised to call insurance company to determine out of pocket expense if they have not yet received this vaccine. Advised may also receive vaccine at local pharmacy or Health Dept. Verbalized acceptance and understanding.  Screening Tests Health Maintenance  Topic Date Due  . COVID-19 Vaccine (3 - Pfizer risk 4-dose series) 11/10/2020 (Originally 12/28/2019)  . INFLUENZA VACCINE  12/06/2020 (Originally 04/08/2020)  . DEXA SCAN  10/25/2021 (Originally 01/01/2011)  . TETANUS/TDAP  10/25/2021 (Originally 12/13/1952)  . PNA vac Low Risk Adult (1 of 2 - PCV13) 10/25/2021 (Originally 12/12/2000)    Health Maintenance  There are no preventive  care reminders to display for this patient.  Colorectal cancer screening: No longer required.   Mammogram status: No longer required due to age.  Bone Density status: Currenlty due. Declined order at this time.   Lung Cancer Screening: (Low Dose CT Chest recommended if Age 74-80 years, 30 Jacober-year currently smoking OR have quit w/in 15years.) does not qualify.   Additional Screening:  Vision Screening: Recommended annual ophthalmology exams for early detection of glaucoma and other disorders of the eye. Is the patient up to date with their annual eye exam?  Yes  Who is the provider or what is the name of the office in which the patient attends annual eye exams? Dr Wallace Going @ [place] If pt is not established with a provider, would they like to be referred to a provider to establish care? No .   Dental Screening: Recommended annual dental exams for proper oral hygiene  Community Resource Referral / Chronic Care Management: CRR required this visit?  No   CCM required this visit?  No      Plan:     I have personally reviewed and noted the following in the patient's chart:   . Medical and social history . Use of alcohol, tobacco or illicit drugs  . Current medications and supplements . Functional ability and status . Nutritional status . Physical activity . Advanced directives . List of other physicians . Hospitalizations, surgeries, and ER visits in previous 12 months . Vitals . Screenings to include cognitive, depression, and falls . Referrals and appointments  In addition, I have reviewed and discussed with patient certain preventive protocols, quality metrics, and best practice recommendations. A written personalized care plan for preventive services as well as general preventive health recommendations were provided to patient.     Detron Carras [place], [place]   04/18/9146   Nurse Notes: Pt declined  a DEXA scan or receiving a flu, pneumonia, tetanus or Covid booster.

## 2020-10-25 ENCOUNTER — Ambulatory Visit (INDEPENDENT_AMBULATORY_CARE_PROVIDER_SITE_OTHER): Payer: Medicare Other

## 2020-10-25 ENCOUNTER — Other Ambulatory Visit: Payer: Self-pay

## 2020-10-25 ENCOUNTER — Ambulatory Visit (INDEPENDENT_AMBULATORY_CARE_PROVIDER_SITE_OTHER): Payer: Medicare Other | Admitting: Family Medicine

## 2020-10-25 ENCOUNTER — Encounter: Payer: Self-pay | Admitting: Family Medicine

## 2020-10-25 VITALS — BP 122/76 | HR 72 | Temp 99.1°F | Ht 65.0 in | Wt 143.2 lb

## 2020-10-25 DIAGNOSIS — Z0000 Encounter for general adult medical examination without abnormal findings: Secondary | ICD-10-CM | POA: Diagnosis not present

## 2020-10-25 DIAGNOSIS — C50911 Malignant neoplasm of unspecified site of right female breast: Secondary | ICD-10-CM

## 2020-10-25 DIAGNOSIS — G629 Polyneuropathy, unspecified: Secondary | ICD-10-CM

## 2020-10-25 DIAGNOSIS — E785 Hyperlipidemia, unspecified: Secondary | ICD-10-CM

## 2020-10-25 DIAGNOSIS — E039 Hypothyroidism, unspecified: Secondary | ICD-10-CM | POA: Diagnosis not present

## 2020-10-25 DIAGNOSIS — Z170 Estrogen receptor positive status [ER+]: Secondary | ICD-10-CM

## 2020-10-25 DIAGNOSIS — N812 Incomplete uterovaginal prolapse: Secondary | ICD-10-CM

## 2020-10-25 NOTE — Patient Instructions (Signed)
[name] , Thank you for taking time to come for your Medicare Wellness Visit. I appreciate your ongoing commitment to your health goals. Please review the following plan we discussed and let me know if I can assist you in the future.   Screening recommendations/referrals: Colonoscopy: No longer required.  Mammogram: No longer required.  Bone Density: Currently due, declined order at this time.  Recommended yearly ophthalmology/optometry visit for glaucoma screening and checkup Recommended yearly dental visit for hygiene and checkup  Vaccinations: Influenza vaccine: Currently due, declined receiving.  Pneumococcal vaccine: Currently due, declined receiving.  Tdap vaccine: Currently due, declined receiving.  Shingles vaccine: Shingrix discussed. Please contact your pharmacy for coverage information.     Advanced directives: Currently on file.   Conditions/risks identified: Recommend to increase water intake to 6-8 8 oz glasses a day.   Next appointment: 9:40 AM today with [name]. Declined scheduling an AWV or CPE for 2023 at this time.    Preventive Care 32 Years and Older, Female Preventive care refers to lifestyle choices and visits with your health care provider that can promote health and wellness. What does preventive care include?  A yearly physical exam. This is also called an annual well check.  Dental exams once or twice a year.  Routine eye exams. Ask your health care provider how often you should have your eyes checked.  Personal lifestyle choices, including:  Daily care of your teeth and gums.  Regular physical activity.  Eating a healthy diet.  Avoiding tobacco and drug use.  Limiting alcohol use.  Practicing safe sex.  Taking low-dose aspirin every day.  Taking vitamin and mineral supplements as recommended by your health care provider. What happens during an annual well check? The services and screenings done by your health care provider during  your annual well check will depend on your age, overall health, lifestyle risk factors, and family history of disease. Counseling  Your health care provider may ask you questions about your:  Alcohol use.  Tobacco use.  Drug use.  Emotional well-being.  Home and relationship well-being.  Sexual activity.  Eating habits.  History of falls.  Memory and ability to understand (cognition).  Work and work Statistician.  Reproductive health. Screening  You may have the following tests or measurements:  Height, weight, and BMI.  Blood pressure.  Lipid and cholesterol levels. These may be checked every 5 years, or more frequently if you are over 69 years old.  Skin check.  Lung cancer screening. You may have this screening every year starting at age 69 if you have a 30-Muto-year history of smoking and currently smoke or have quit within the past 15 years.  Fecal occult blood test (FOBT) of the stool. You may have this test every year starting at age 45.  Flexible sigmoidoscopy or colonoscopy. You may have a sigmoidoscopy every 5 years or a colonoscopy every 10 years starting at age 85.  Hepatitis C blood test.  Hepatitis B blood test.  Sexually transmitted disease (STD) testing.  Diabetes screening. This is done by checking your blood sugar (glucose) after you have not eaten for a while (fasting). You may have this done every 1-3 years.  Bone density scan. This is done to screen for osteoporosis. You may have this done starting at age 27.  Mammogram. This may be done every 1-2 years. Talk to your health care provider about how often you should have regular mammograms. Talk with your health care provider about your test  results, treatment options, and if necessary, the need for more tests. Vaccines  Your health care provider may recommend certain vaccines, such as:  Influenza vaccine. This is recommended every year.  Tetanus, diphtheria, and acellular pertussis (Tdap,  Td) vaccine. You may need a Td booster every 10 years.  Zoster vaccine. You may need this after age 68.  Pneumococcal 13-valent conjugate (PCV13) vaccine. One dose is recommended after age 48.  Pneumococcal polysaccharide (PPSV23) vaccine. One dose is recommended after age 72. Talk to your health care provider about which screenings and vaccines you need and how often you need them. This information is not intended to replace advice given to you by your health care provider. Make sure you discuss any questions you have with your health care provider. Document Released: 09/21/2015 Document Revised: 05/14/2016 Document Reviewed: 06/26/2015 Elsevier Interactive Patient Education  2017 [employer] Prevention in the Home Falls can cause injuries. They can happen to people of all ages. There are many things you can do to make your home safe and to help prevent falls. What can I do on the outside of my home?  Regularly fix the edges of walkways and driveways and fix any cracks.  Remove anything that might make you trip as you walk through a door, such as a raised step or threshold.  Trim any bushes or trees on the path to your home.  Use bright outdoor lighting.  Clear any walking paths of anything that might make someone trip, such as rocks or tools.  Regularly check to see if handrails are loose or broken. Make sure that both sides of any steps have handrails.  Any raised decks and porches should have guardrails on the edges.  Have any leaves, snow, or ice cleared regularly.  Use sand or salt on walking paths during winter.  Clean up any spills in your garage right away. This includes oil or grease spills. What can I do in the bathroom?  Use night lights.  Install grab bars by the toilet and in the tub and shower. Do not use towel bars as grab bars.  Use non-skid mats or decals in the tub or shower.  If you need to sit down in the shower, use a plastic, non-slip  stool.  Keep the floor dry. Clean up any water that spills on the floor as soon as it happens.  Remove soap buildup in the tub or shower regularly.  Attach bath mats securely with double-sided non-slip rug tape.  Do not have throw rugs and other things on the floor that can make you trip. What can I do in the bedroom?  Use night lights.  Make sure that you have a light by your bed that is easy to reach.  Do not use any sheets or blankets that are too big for your bed. They should not hang down onto the floor.  Have a firm chair that has side arms. You can use this for support while you get dressed.  Do not have throw rugs and other things on the floor that can make you trip. What can I do in the kitchen?  Clean up any spills right away.  Avoid walking on wet floors.  Keep items that you use a lot in easy-to-reach places.  If you need to reach something above you, use a strong step stool that has a grab bar.  Keep electrical cords out of the way.  Do not use floor polish or wax that makes  floors slippery. If you must use wax, use non-skid floor wax.  Do not have throw rugs and other things on the floor that can make you trip. What can I do with my stairs?  Do not leave any items on the stairs.  Make sure that there are handrails on both sides of the stairs and use them. Fix handrails that are broken or loose. Make sure that handrails are as long as the stairways.  Check any carpeting to make sure that it is firmly attached to the stairs. Fix any carpet that is loose or worn.  Avoid having throw rugs at the top or bottom of the stairs. If you do have throw rugs, attach them to the floor with carpet tape.  Make sure that you have a light switch at the top of the stairs and the bottom of the stairs. If you do not have them, ask someone to add them for you. What else can I do to help prevent falls?  Wear shoes that:  Do not have high heels.  Have rubber bottoms.  Are  comfortable and fit you well.  Are closed at the toe. Do not wear sandals.  If you use a stepladder:  Make sure that it is fully opened. Do not climb a closed stepladder.  Make sure that both sides of the stepladder are locked into place.  Ask someone to hold it for you, if possible.  Clearly mark and make sure that you can see:  Any grab bars or handrails.  First and last steps.  Where the edge of each step is.  Use tools that help you move around (mobility aids) if they are needed. These include:  Canes.  Walkers.  Scooters.  Crutches.  Turn on the lights when you go into a dark area. Replace any light bulbs as soon as they burn out.  Set up your furniture so you have a clear path. Avoid moving your furniture around.  If any of your floors are uneven, fix them.  If there are any pets around you, be aware of where they are.  Review your medicines with your doctor. Some medicines can make you feel dizzy. This can increase your chance of falling. Ask your doctor what other things that you can do to help prevent falls. This information is not intended to replace advice given to you by your health care provider. Make sure you discuss any questions you have with your health care provider. Document Released: 06/21/2009 Document Revised: 01/31/2016 Document Reviewed: 09/29/2014 Elsevier Interactive Patient Education  2017 [employer].

## 2020-10-25 NOTE — Progress Notes (Signed)
I,Renee Powell,acting as a scribe for Wilhemena Durie, MD.,have documented all relevant documentation on the behalf of Wilhemena Durie, MD,as directed by  Wilhemena Durie, MD while in the presence of Wilhemena Durie, MD.   Complete physical exam   Patient: Renee Powell   DOB: [birth year]   86 y.o. Female  MRN: [id number] Visit Date: 10/25/2020  Today's healthcare provider: Wilhemena Durie, MD   No chief complaint on file.  Subjective    Renee Powell is a 87 y.o. female who presents today for a complete physical exam.  She reports consuming a general diet. The patient does not participate in regular exercise at present. She generally feels well. She reports sleeping fairly well. She does not have additional problems to discuss today.  She is widowed and is a mother of 2 sons and 2 grandsons. HPI  She sees Dr. Kenton Kingfisher from GYN for pessary. Sees Dr. Ronnald Collum for thyroid She sees Dr. Murvin Natal for recent outbreak of shingles in her left eye. Patient had AWV with [facility] today at 9:00 am.  Past Medical History:  Diagnosis Date  . Breast cancer [facility]) 2002   right lumpectomy with radiation  . Cancer [facility]) 2002   breast  . Hyperlipidemia   . Personal history of radiation therapy 2002   Right breast  . Shingles of eyelid   . Thyroid disease 09/2019   Pt had tumors treated with radiation therapy   Past Surgical History:  Procedure Laterality Date  . BREAST BIOPSY Right 2002   breast ca radation  . BREAST SURGERY Right 2002   lumpectomy  . COLONOSCOPY WITH PROPOFOL N/A 05/06/2017   Procedure: COLONOSCOPY WITH PROPOFOL;  Surgeon: [name], MD;  Location: [facility] ENDOSCOPY;  Service: Gastroenterology;  Laterality: N/A;   Social History   Socioeconomic History  . Marital status: Widowed    Spouse name: Not on file  . Number of children: 2  . Years of education: Not on file  . Highest education level: 12th grade  Occupational History  . Occupation: retired   Tobacco Use  . Smoking status: Never Smoker  . Smokeless tobacco: Never Used  Vaping Use  . Vaping Use: Never used  Substance and Sexual Activity  . Alcohol use: No    Alcohol/week: 0.0 standard drinks  . Drug use: No  . Sexual activity: Not Currently  Other Topics Concern  . Not on file  Social History Narrative  . Not on file   Social Determinants of Health   Financial Resource Strain: Not on file  Food Insecurity: Not on file  Transportation Needs: Not on file  Physical Activity: Not on file  Stress: Not on file  Social Connections: Not on file  Intimate Partner Violence: Not on file   Family Status  Relation Name Status  . Father  Deceased  . Mat Uncle  Deceased  . Ethlyn Daniels  Deceased  . Annamarie Major  Deceased  . Annamarie Major  Deceased  . Mother  Deceased  . Son  Alive  . Son  Alive  . PGM  (Not Specified)  . Mat Aunt  (Not Specified)  . MGM  (Not Specified)   Family History  Problem Relation Age of Onset  . Cancer Father        lung cancer  . Cancer Maternal Uncle   . Cancer Paternal Aunt        Breast  . Cancer Paternal Uncle  Kidney  . Cancer Paternal Uncle        Lung  . Heart attack Mother   . Cancer Paternal Grandmother        Breast  . Breast cancer Maternal Aunt 60  . Breast cancer Maternal Grandmother 60   Allergies  Allergen Reactions  . Latex     Patient Care Team: Jerrol Banana., MD as PCP - General (Family Medicine) Ronnald Collum, Lourdes Sledge, MD as Attending Physician (Endocrinology)   Medications: Outpatient Medications Prior to Visit  Medication Sig  . AK-POLY-BAC 500-10000 UNIT/GM OINT Apply to eye.  . COMBIGAN 0.2-0.5 % ophthalmic solution   . DUREZOL 0.05 % EMUL Place 1 drop into the left eye 2 (two) times daily.  . famotidine (PEPCID) 20 MG tablet Take 1 tablet (20 mg total) by mouth daily.  . furosemide (LASIX) 20 MG tablet Take 1 tablet (20 mg total) by mouth daily.  Marland Kitchen gabapentin (NEURONTIN) 100 MG capsule 1 tablet at  bedtime for 1 week, Then add 1 tablet in the morning for 1 week, Then add 1 tablet in the afternoon  . ketotifen (ZADITOR) 0.025 % ophthalmic solution 1 drop 2 (two) times daily.  Marland Kitchen levothyroxine (SYNTHROID, LEVOTHROID) 88 MCG tablet Take 88 mcg daily before breakfast by mouth.  . nitrofurantoin, macrocrystal-monohydrate, (MACROBID) 100 MG capsule Take 1 capsule (100 mg total) by mouth 2 (two) times daily.  Marland Kitchen oxybutynin (DITROPAN-XL) 10 MG 24 hr tablet Take 1 tablet (10 mg total) by mouth at bedtime.  . polyethylene glycol powder (GLYCOLAX/MIRALAX) powder 17 grams a day as needed if constipated  . potassium chloride (K-DUR) 10 MEQ tablet Take 1 tablet (10 mEq total) by mouth 2 (two) times daily.  . valACYclovir (VALTREX) 1000 MG tablet Take by mouth.   No facility-administered medications prior to visit.    Review of Systems  All other systems reviewed and are negative.      Objective     Vitals:  BP 122/76 (BP Location: Right Arm)  Pulse 72  Temp 99.1 F (37.3 C) (Oral)  Ht 5\' 5"  (1.651 m)  Wt 143 lb 3.2 oz (65 kg)  SpO2 100%  BMI 23.83 kg/m  BSA 1.73 m  Pain [place] 5 (Loc: Eye)    Physical Exam Vitals reviewed.  Constitutional:      Appearance: She is well-developed.  HENT:     Head: Normocephalic and atraumatic.     Right Ear: External ear normal.     Left Ear: External ear normal.     Nose: Nose normal.  Eyes:     Conjunctiva/sclera: Conjunctivae normal.     Pupils: Pupils are equal, round, and reactive to light.  Cardiovascular:     Rate and Rhythm: Normal rate and regular rhythm.     Heart sounds: Normal heart sounds.  Pulmonary:     Effort: Pulmonary effort is normal.     Breath sounds: Normal breath sounds.  Abdominal:     General: Bowel sounds are normal.     Palpations: Abdomen is soft.  Musculoskeletal:     Cervical back: Normal range of motion and neck supple.     Right lower leg: No edema.     Left lower leg: No edema.  Skin:    General: Skin is  warm and dry.  Neurological:     General: No focal deficit present.     Mental Status: She is alert and oriented to person, place, and time.     Deep  Tendon Reflexes: Reflexes are normal and symmetric.  Psychiatric:        Mood and Affect: Mood normal.        Behavior: Behavior normal.        Thought Content: Thought content normal.        Judgment: Judgment normal.       Last depression screening scores PHQ 2/9 Scores 03/25/2018 05/14/2017 02/13/2016  PHQ - 2 Score 0 0 0   Last fall risk screening Fall Risk  03/25/2018  Falls in the past year? Yes  Number falls in past yr: 1  Injury with Fall? No  Follow up Falls prevention discussed   Last Audit-C alcohol use screening No flowsheet data found. A score of 3 or more in women, and 4 or more in men indicates increased risk for alcohol abuse, EXCEPT if all of the points are from question 1   No results found for any visits on 10/25/20.  Assessment & Plan    Routine Health Maintenance and Physical Exam  Exercise Activities and Dietary recommendations Goals    . DIET - INCREASE WATER INTAKE     Recommend increasing water intake to 3 glasses a day.        Immunization History  Administered Date(s) Administered  . PFIZER(Purple Top)SARS-COV-2 Vaccination 11/09/2019, 11/30/2019    Health Maintenance  Topic Date Due  . TETANUS/TDAP  Never done  . PNA vac Low Risk Adult (1 of 2 - PCV13) Never done  . DEXA SCAN  01/01/2011  . COVID-19 Vaccine (3 - Pfizer risk 4-dose series) 12/28/2019  . INFLUENZA VACCINE  Never done    Discussed health benefits of physical activity, and encouraged her to engage in regular exercise appropriate for her age and condition.  1. Annual physical exam Patient declines breast exam.  Pelvic exam per Dr. From GYN. No further colonoscopy at 86  2. Hyperlipidemia, unspecified hyperlipidemia type   3. Hypothyroidism, unspecified type   4. Neuropathy   5. Cystocele with incomplete uterovaginal  prolapse Dr. Kenton Kingfisher  6. Malignant neoplasm of right breast in female, estrogen receptor positive, unspecified site of breast ([place]) History of breast cancer in the past   No follow-ups on file.     I, Wilhemena Durie, MD, have reviewed all documentation for this visit. The documentation on 10/28/20 for the exam, diagnosis, procedures, and orders are all accurate and complete.    Marissa Lowrey Cranford Mon, MD  [facility] [phone] (phone) [phone] (fax)  [place]

## 2020-11-26 ENCOUNTER — Encounter: Payer: Medicare Other | Admitting: Dermatology

## 2020-12-06 ENCOUNTER — Other Ambulatory Visit: Payer: Self-pay

## 2020-12-06 ENCOUNTER — Ambulatory Visit (INDEPENDENT_AMBULATORY_CARE_PROVIDER_SITE_OTHER): Payer: Medicare Other | Admitting: Obstetrics & Gynecology

## 2020-12-06 ENCOUNTER — Encounter: Payer: Self-pay | Admitting: Obstetrics & Gynecology

## 2020-12-06 VITALS — BP 120/80 | Ht 60.0 in | Wt 143.0 lb

## 2020-12-06 DIAGNOSIS — N812 Incomplete uterovaginal prolapse: Secondary | ICD-10-CM | POA: Diagnosis not present

## 2020-12-06 DIAGNOSIS — N3281 Overactive bladder: Secondary | ICD-10-CM

## 2020-12-06 NOTE — Progress Notes (Signed)
HPI:      [name] is a 87 y.o. G2P2 who presents today for her pessary follow up and examination related to her pelvic floor weakening.  Pt reports tolerating the pessary well with no vaginal bleeding and no vaginal discharge.  Symptoms of pelvic floor weakening have greatly improved. She is voiding and defecating without difficulty. She currently has a #5 Ring type pessary.  PMHx: She  has a past medical history of Breast cancer ([place]) (2002), Cancer ([place]) (2002), Hyperlipidemia, Personal history of radiation therapy (2002), Shingles of eyelid, and Thyroid disease (09/2019). Also,  has a past surgical history that includes Breast surgery (Right, 2002); Colonoscopy with propofol (N/A, 05/06/2017); and Breast biopsy (Right, 2002)., family history includes Breast cancer (age of onset: 21) in her maternal aunt and maternal grandmother; Cancer in her father, maternal uncle, paternal aunt, paternal grandmother, paternal uncle, and paternal uncle; Heart attack in her mother.,  reports that she has never smoked. She has never used smokeless tobacco. She reports that she does not drink alcohol and does not use drugs.  She has a current medication list which includes the following prescription(s): ak-poly-bac, carboxymethylcellulose, combigan, durezol, famotidine, furosemide, gabapentin, ketotifen, levothyroxine, nitrofurantoin (macrocrystal-monohydrate), oxybutynin, polyethylene glycol powder, potassium chloride, prednisolone acetate, and valacyclovir. Also, is allergic to latex.  Review of Systems  All other systems reviewed and are negative.   Objective: BP 120/80   Ht 5' (1.524 m)   Wt 143 lb (64.9 kg)   BMI 27.93 kg/m  Physical Exam Constitutional:      General: She is not in acute distress.    Appearance: She is well-developed.  Genitourinary:     Bladder and urethral meatus normal.     No vaginal erythema or bleeding.      Right Adnexa: not tender and no mass present.    Left  Adnexa: not tender and no mass present.    No cervical motion tenderness, discharge, polyp or nabothian cyst.     Uterus is prolapsed.     Uterus is not enlarged.     No uterine mass detected.    Uterus is midaxial.     Bladder exam comments: Gr 1 Cystocele.     Pelvic exam was performed with patient in the lithotomy position.  HENT:     Head: Normocephalic and atraumatic.     Nose: Nose normal.  Abdominal:     General: There is no distension.     Palpations: Abdomen is soft.     Tenderness: There is no abdominal tenderness.  Musculoskeletal:        General: Normal range of motion.  Neurological:     Mental Status: She is alert and oriented to person, place, and time.     Cranial Nerves: No cranial nerve deficit.  Skin:    General: Skin is warm and dry.  Psychiatric:        Attention and Perception: Attention normal.        Mood and Affect: Mood and affect normal.        Speech: Speech normal.        Behavior: Behavior normal.        Thought Content: Thought content normal.        Judgment: Judgment normal.     Pessary Care Pessary removed and cleaned.  Vagina checked - without erosions - pessary replaced.  A/P:   ICD-10-CM   1. Cystocele with incomplete uterovaginal prolapse  N81.2   2. Overactive bladder  N32.81   Pessary was cleaned and replaced today. Instructions given for care. Concerning symptoms to observe for are counseled to patient. Follow up scheduled for 3 months.  A total of 20 minutes were spent face-to-face with the patient as well as preparation, review, communication, and documentation during this encounter.   Barnett Applebaum, MD, [facility], [facility] 12/06/2020  1:23 PM

## 2021-03-05 ENCOUNTER — Encounter: Payer: Self-pay | Admitting: Obstetrics & Gynecology

## 2021-03-05 ENCOUNTER — Other Ambulatory Visit: Payer: Self-pay

## 2021-03-05 ENCOUNTER — Ambulatory Visit (INDEPENDENT_AMBULATORY_CARE_PROVIDER_SITE_OTHER): Payer: Medicare Other | Admitting: Obstetrics & Gynecology

## 2021-03-05 VITALS — BP 120/80 | Ht 65.0 in | Wt 141.0 lb

## 2021-03-05 DIAGNOSIS — N3281 Overactive bladder: Secondary | ICD-10-CM | POA: Diagnosis not present

## 2021-03-05 DIAGNOSIS — N812 Incomplete uterovaginal prolapse: Secondary | ICD-10-CM

## 2021-03-05 NOTE — Progress Notes (Signed)
HPI:      [name] is a 87 y.o. G2P2 who presents today for her pessary follow up and examination related to her pelvic floor weakening.  Pt reports tolerating the pessary well with no vaginal bleeding and no vaginal discharge.  Symptoms of pelvic floor weakening have greatly improved. Occasionally feels like it drops down, esp after lifting activities. No expulsion. She is voiding and defecating without difficulty. She currently has a Ring #5 pessary.  PMHx: She  has a past medical history of Breast cancer ([place]) (2002), Cancer ([place]) (2002), Hyperlipidemia, Personal history of radiation therapy (2002), Shingles of eyelid, and Thyroid disease (09/2019). Also,  has a past surgical history that includes Breast surgery (Right, 2002); Colonoscopy with propofol (N/A, 05/06/2017); and Breast biopsy (Right, 2002)., family history includes Breast cancer (age of onset: 7) in her maternal aunt and maternal grandmother; Cancer in her father, maternal uncle, paternal aunt, paternal grandmother, paternal uncle, and paternal uncle; Heart attack in her mother.,  reports that she has never smoked. She has never used smokeless tobacco. She reports that she does not drink alcohol and does not use drugs.  She has a current medication list which includes the following prescription(s): ak-poly-bac, carboxymethylcellulose, combigan, durezol, famotidine, furosemide, gabapentin, ketotifen, levothyroxine, nitrofurantoin (macrocrystal-monohydrate), oxybutynin, polyethylene glycol powder, potassium chloride, prednisolone acetate, and valacyclovir. Also, is allergic to latex.  Review of Systems  All other systems reviewed and are negative.  Objective: BP 120/80   Ht 5\' 5"  (1.651 m)   Wt 141 lb (64 kg)   BMI 23.46 kg/m  Physical Exam Constitutional:      General: She is not in acute distress.    Appearance: She is well-developed.  Genitourinary:     Right Labia: No rash or tenderness.    Left Labia: No  tenderness or rash.    No vaginal erythema or bleeding.     Moderate vaginal atrophy present.     Right Adnexa: not tender and no mass present.    Left Adnexa: not tender and no mass present.    No cervical motion tenderness, discharge, polyp or nabothian cyst.     Uterus is not enlarged.     No uterine mass detected.    Uterus exam comments: Prolpase noted.     Uterus is midaxial.     Bladder exam comments: Cystocele noted.     Pelvic exam was performed with patient in the lithotomy position.  HENT:     Head: Normocephalic and atraumatic.     Nose: Nose normal.  Abdominal:     General: There is no distension.     Palpations: Abdomen is soft.     Tenderness: There is no abdominal tenderness.  Musculoskeletal:        General: Normal range of motion.  Neurological:     Mental Status: She is alert and oriented to person, place, and time.     Cranial Nerves: No cranial nerve deficit.  Skin:    General: Skin is warm and dry.  Psychiatric:        Attention and Perception: Attention normal.        Mood and Affect: Mood and affect normal.        Speech: Speech normal.        Behavior: Behavior normal.        Thought Content: Thought content normal.        Judgment: Judgment normal.    Pessary Care Pessary removed and cleaned.  Vagina  checked - without erosions - pessary replaced.  A/P:   ICD-10-CM   1. Cystocele with incomplete uterovaginal prolapse  N81.2     2. Overactive bladder  N32.81      Pessary was cleaned and replaced today. Instructions given for care. Concerning symptoms to observe for are counseled to patient. Follow up scheduled for 3 months.  A total of 20 minutes were spent face-to-face with the patient as well as preparation, review, communication, and documentation during this encounter.   Barnett Applebaum, MD, [facility], [facility] 03/05/2021  9:46 AM

## 2021-04-23 ENCOUNTER — Other Ambulatory Visit: Payer: Self-pay | Admitting: Family Medicine

## 2021-04-23 DIAGNOSIS — Z1231 Encounter for screening mammogram for malignant neoplasm of breast: Secondary | ICD-10-CM

## 2021-06-10 ENCOUNTER — Other Ambulatory Visit: Payer: Self-pay

## 2021-06-10 ENCOUNTER — Ambulatory Visit
Admission: RE | Admit: 2021-06-10 | Discharge: 2021-06-10 | Disposition: A | Discharge status: Home / Self Care | Payer: Medicare Other | Source: Ambulatory Visit | Attending: Family Medicine | Admitting: Family Medicine

## 2021-06-10 DIAGNOSIS — Z1231 Encounter for screening mammogram for malignant neoplasm of breast: Secondary | ICD-10-CM | POA: Diagnosis not present

## 2021-06-11 ENCOUNTER — Encounter: Payer: Self-pay | Admitting: Obstetrics & Gynecology

## 2021-06-11 ENCOUNTER — Ambulatory Visit (INDEPENDENT_AMBULATORY_CARE_PROVIDER_SITE_OTHER): Payer: Medicare Other | Admitting: Obstetrics & Gynecology

## 2021-06-11 VITALS — BP 118/80 | Ht 61.0 in | Wt 141.0 lb

## 2021-06-11 DIAGNOSIS — N812 Incomplete uterovaginal prolapse: Secondary | ICD-10-CM | POA: Diagnosis not present

## 2021-06-11 DIAGNOSIS — N3281 Overactive bladder: Secondary | ICD-10-CM | POA: Diagnosis not present

## 2021-06-11 NOTE — Progress Notes (Signed)
HPI:      [name] is a 87 y.o. G2P2 who presents today for her pessary follow up and examination related to her pelvic floor weakening.  Pt reports tolerating the pessary well with no vaginal bleeding and no vaginal discharge.  Symptoms of pelvic floor weakening have greatly improved. She is voiding and defecating without difficulty. She currently has a RIng type pessary #5 pessary.  PMHx: She  has a past medical history of Breast cancer ([place]) (2002), Cancer ([place]) (2002), Hyperlipidemia, Personal history of radiation therapy (2002), Shingles of eyelid, and Thyroid disease (09/2019). Also,  has a past surgical history that includes Breast surgery (Right, 2002); Colonoscopy with propofol (N/A, 05/06/2017); and Breast biopsy (Right, 2002)., family history includes Breast cancer (age of onset: 67) in her maternal aunt and maternal grandmother; Cancer in her father, maternal uncle, paternal aunt, paternal grandmother, paternal uncle, and paternal uncle; Heart attack in her mother.,  reports that she has never smoked. She has never used smokeless tobacco. She reports that she does not drink alcohol and does not use drugs.  She has a current medication list which includes the following prescription(s): ak-poly-bac, carboxymethylcellulose, combigan, durezol, famotidine, furosemide, gabapentin, ketotifen, levothyroxine, nitrofurantoin (macrocrystal-monohydrate), oxybutynin, polyethylene glycol powder, potassium chloride, prednisolone acetate, and valacyclovir. Also, is allergic to latex.  Review of Systems  All other systems reviewed and are negative.  Objective: BP 118/80   Ht 5\' 1"  (1.549 m)   Wt 141 lb (64 kg)   BMI 26.64 kg/m  Physical Exam Constitutional:      General: She is not in acute distress.    Appearance: She is well-developed.  Genitourinary:     Right Labia: No rash or tenderness.    Left Labia: No tenderness or rash.    No vaginal erythema or bleeding.     Mild vaginal  atrophy present.     Right Adnexa: not tender and no mass present.    Left Adnexa: not tender and no mass present.    No cervical motion tenderness, discharge, polyp or nabothian cyst.     Uterus is prolapsed.     Uterus is not enlarged.     No uterine mass detected.    Pelvic exam was performed with patient in the lithotomy position.  HENT:     Head: Normocephalic and atraumatic.     Nose: Nose normal.  Abdominal:     General: There is no distension.     Palpations: Abdomen is soft.     Tenderness: There is no abdominal tenderness.  Musculoskeletal:        General: Normal range of motion.  Neurological:     Mental Status: She is alert and oriented to person, place, and time.     Cranial Nerves: No cranial nerve deficit.  Skin:    General: Skin is warm and dry.  Psychiatric:        Attention and Perception: Attention normal.        Mood and Affect: Mood and affect normal.        Speech: Speech normal.        Behavior: Behavior normal.        Thought Content: Thought content normal.        Judgment: Judgment normal.    Pessary Care Pessary removed and cleaned.  Vagina checked - without erosions - pessary replaced.  A/P:1. Cystocele with incomplete uterovaginal prolapse 2. Overactive bladder Pessary was cleaned and replaced today. Instructions given for care. Concerning  symptoms to observe for are counseled to patient. Follow up scheduled for 3 months.  A total of 20 minutes were spent face-to-face with the patient as well as preparation, review, communication, and documentation during this encounter.   Barnett Applebaum, MD, [facility], [facility] 06/11/2021  8:42 AM

## 2021-07-25 ENCOUNTER — Ambulatory Visit (INDEPENDENT_AMBULATORY_CARE_PROVIDER_SITE_OTHER): Payer: Medicare Other | Admitting: Family Medicine

## 2021-07-25 ENCOUNTER — Other Ambulatory Visit: Payer: Self-pay

## 2021-07-25 VITALS — BP 145/77 | HR 67 | Temp 98.2°F | Wt 139.3 lb

## 2021-07-25 DIAGNOSIS — B0230 Zoster ocular disease, unspecified: Secondary | ICD-10-CM

## 2021-07-25 DIAGNOSIS — E039 Hypothyroidism, unspecified: Secondary | ICD-10-CM

## 2021-07-25 DIAGNOSIS — E785 Hyperlipidemia, unspecified: Secondary | ICD-10-CM

## 2021-07-25 DIAGNOSIS — R739 Hyperglycemia, unspecified: Secondary | ICD-10-CM

## 2021-07-25 NOTE — Progress Notes (Signed)
I,Roshena L Chambers,acting as a scribe for Wilhemena Durie, MD.,have documented all relevant documentation on the behalf of Wilhemena Durie, MD,as directed by  Wilhemena Durie, MD while in the presence of Wilhemena Durie, MD.       Established patient visit   Patient: Renee Powell   DOB: 1934/03/10   87 y.o. Female  MRN: [id number] Visit Date: 07/25/2021  Today's healthcare provider: Wilhemena Durie, MD   Chief Complaint  Patient presents with   Hypothyroidism   Subjective    HPI  Patient is in for follow-up but I am not sure what for.  She sees Dr. Ronnald Collum for thyroid problems Dr. Pauline Good gyn  for cystocele and ophthalmology /dr Murvin Natal for her chronic left eye discomfort and vision loss from the shingles. Is widowed and has 2 sons and 2 grandsons Hypothyroid, follow-up  Lab Results  Component Value Date   TSH 2.382 05/04/2017   Wt Readings from Last 3 Encounters:  07/25/21 139 lb 4.8 oz (63.2 kg)  06/11/21 141 lb (64 kg)  03/05/21 141 lb (64 kg)    She was last seen for hypothyroid 05/04/2017.  Management since that visit includes; on levothyroxine 88 mcg. She reports good compliance with treatment. She is not having side effects.   -----------------------------------------------------------------------------------------     Medications: Outpatient Medications Prior to Visit  Medication Sig   carboxymethylcellulose (REFRESH PLUS) 0.5 % SOLN 1 drop 3 (three) times daily as needed.   COMBIGAN 0.2-0.5 % ophthalmic solution    DUREZOL 0.05 % EMUL Place 1 drop into the left eye 2 (two) times daily.   levothyroxine (SYNTHROID, LEVOTHROID) 88 MCG tablet Take 88 mcg daily before breakfast by mouth.   prednisoLONE acetate (PRED FORTE) 1 % ophthalmic suspension Place 1 drop into the left eye 4 (four) times daily.   AK-POLY-BAC 500-10000 UNIT/GM OINT Apply to eye. (Patient not taking: Reported on 07/25/2021)   famotidine (PEPCID) 20 MG tablet  Take 1 tablet (20 mg total) by mouth daily. (Patient not taking: Reported on 07/25/2021)   furosemide (LASIX) 20 MG tablet Take 1 tablet (20 mg total) by mouth daily. (Patient not taking: Reported on 07/25/2021)   gabapentin (NEURONTIN) 100 MG capsule 1 tablet at bedtime for 1 week, Then add 1 tablet in the morning for 1 week, Then add 1 tablet in the afternoon (Patient not taking: Reported on 07/25/2021)   ketotifen (ZADITOR) 0.025 % ophthalmic solution 1 drop 2 (two) times daily. (Patient not taking: Reported on 07/25/2021)   nitrofurantoin, macrocrystal-monohydrate, (MACROBID) 100 MG capsule Take 1 capsule (100 mg total) by mouth 2 (two) times daily. (Patient not taking: Reported on 07/25/2021)   oxybutynin (DITROPAN-XL) 10 MG 24 hr tablet Take 1 tablet (10 mg total) by mouth at bedtime. (Patient not taking: Reported on 07/25/2021)   polyethylene glycol powder (GLYCOLAX/MIRALAX) powder 17 grams a day as needed if constipated (Patient not taking: Reported on 07/25/2021)   potassium chloride (K-DUR) 10 MEQ tablet Take 1 tablet (10 mEq total) by mouth 2 (two) times daily. (Patient not taking: Reported on 07/25/2021)   valACYclovir (VALTREX) 1000 MG tablet Take by mouth. (Patient not taking: Reported on 07/25/2021)   No facility-administered medications prior to visit.    Review of Systems  Constitutional:  Negative for appetite change, chills, fatigue and fever.  Respiratory:  Negative for chest tightness and shortness of breath.   Cardiovascular:  Negative for chest pain and palpitations.  Gastrointestinal:  Negative for abdominal  pain, nausea and vomiting.  Neurological:  Negative for dizziness and weakness.       Objective    BP (!) 145/77 (BP Location: Right Arm, Patient Position: Sitting, Cuff Size: Small)   Pulse 67   Temp 98.2 F (36.8 C) (Oral)   Wt 139 lb 4.8 oz (63.2 kg)   SpO2 100%   BMI 26.32 kg/m  BP Readings from Last 3 Encounters:  07/25/21 (!) 145/77  06/11/21 118/80   03/05/21 120/80   Wt Readings from Last 3 Encounters:  07/25/21 139 lb 4.8 oz (63.2 kg)  06/11/21 141 lb (64 kg)  03/05/21 141 lb (64 kg)      Physical Exam Vitals reviewed.  Constitutional:      Appearance: She is well-developed.  HENT:     Head: Normocephalic and atraumatic.  Eyes:     General: No scleral icterus.    Conjunctiva/sclera: Conjunctivae normal.  Neck:     Thyroid: No thyromegaly.  Cardiovascular:     Rate and Rhythm: Normal rate and regular rhythm.     Heart sounds: Normal heart sounds.  Pulmonary:     Effort: Pulmonary effort is normal.     Breath sounds: Normal breath sounds.  Abdominal:     Palpations: Abdomen is soft.  Skin:    General: Skin is warm and dry.  Neurological:     Mental Status: She is alert and oriented to person, place, and time.  Psychiatric:        Mood and Affect: Mood normal.        Behavior: Behavior normal.        Thought Content: Thought content normal.        Judgment: Judgment normal.      No results found for any visits on 07/25/21.  Assessment & Plan     1. Hypothyroidism, unspecified type Followed and treated exclusively by Dr. Ronnald Collum, endocrinology - TSH - CBC w/Diff/Platelet - Comprehensive Metabolic Panel (CMET)  2. Hyperlipidemia, unspecified hyperlipidemia type  - TSH - CBC w/Diff/Platelet - Comprehensive Metabolic Panel (CMET)  3. Hyperglycemia  - HgB A1c  4. Herpes zoster of eye Patient is on gabapentin for postherpetic neuralgia  Return in about 6 months (around 01/22/2022).      I, Wilhemena Durie, MD, have reviewed all documentation for this visit. The documentation on 07/27/21 for the exam, diagnosis, procedures, and orders are all accurate and complete.    Natalya Domzalski Cranford Mon, MD  [facility] [phone] (phone) [phone] (fax)  [place]

## 2021-07-26 LAB — CBC WITH DIFFERENTIAL/PLATELET
Basophils Absolute: 0.1 10*3/uL (ref 0.0–0.2)
Basos: 2 %
EOS (ABSOLUTE): 0.2 10*3/uL (ref 0.0–0.4)
Eos: 2 %
Hematocrit: 42.6 % (ref 34.0–46.6)
Hemoglobin: 13.9 g/dL (ref 11.1–15.9)
Immature Grans (Abs): 0 10*3/uL (ref 0.0–0.1)
Immature Granulocytes: 0 %
Lymphocytes Absolute: 2 10*3/uL (ref 0.7–3.1)
Lymphs: 28 %
MCH: 28.5 pg (ref 26.6–33.0)
MCHC: 32.6 g/dL (ref 31.5–35.7)
MCV: 88 fL (ref 79–97)
Monocytes Absolute: 0.7 10*3/uL (ref 0.1–0.9)
Monocytes: 10 %
Neutrophils Absolute: 4.1 10*3/uL (ref 1.4–7.0)
Neutrophils: 58 %
Platelets: 213 10*3/uL (ref 150–450)
RBC: 4.87 x10E6/uL (ref 3.77–5.28)
RDW: 12.9 % (ref 11.7–15.4)
WBC: 7 10*3/uL (ref 3.4–10.8)

## 2021-07-26 LAB — COMPREHENSIVE METABOLIC PANEL
ALT: 9 IU/L (ref 0–32)
AST: 14 IU/L (ref 0–40)
Albumin/Globulin Ratio: 2.1 (ref 1.2–2.2)
Albumin: 4.6 g/dL (ref 3.6–4.6)
Alkaline Phosphatase: 111 IU/L (ref 44–121)
BUN/Creatinine Ratio: 18 (ref 12–28)
BUN: 16 mg/dL (ref 8–27)
Bilirubin Total: 0.7 mg/dL (ref 0.0–1.2)
CO2: 24 mmol/L (ref 20–29)
Calcium: 10 mg/dL (ref 8.7–10.3)
Chloride: 102 mmol/L (ref 96–106)
Creatinine, Ser: 0.91 mg/dL (ref 0.57–1.00)
Globulin, Total: 2.2 g/dL (ref 1.5–4.5)
Glucose: 82 mg/dL (ref 70–99)
Potassium: 4.2 mmol/L (ref 3.5–5.2)
Sodium: 140 mmol/L (ref 134–144)
Total Protein: 6.8 g/dL (ref 6.0–8.5)
eGFR: 62 mL/min/{1.73_m2} (ref >59)

## 2021-07-26 LAB — HEMOGLOBIN A1C
Est. average glucose Bld gHb Est-mCnc: 111 mg/dL
Hgb A1c MFr Bld: 5.5 % (ref 4.8–5.6)

## 2021-07-26 LAB — TSH: TSH: 0.035 u[IU]/mL — ABNORMAL LOW (ref 0.450–4.500)

## 2021-07-29 ENCOUNTER — Telehealth: Payer: Self-pay

## 2021-07-29 NOTE — Telephone Encounter (Signed)
Pt. Calling for lab results and disconnected line. Left message to call back.

## 2021-07-29 NOTE — Telephone Encounter (Signed)
Pt given lab results per notes of Dr. Rosanna Randy on 07/29/21. Pt verbalized understanding.

## 2021-07-29 NOTE — Progress Notes (Deleted)
Pt given lab results per notes of Dr.Gilbert on 07/29/21. Pt verbalized understanding.

## 2021-08-05 ENCOUNTER — Telehealth: Payer: Self-pay | Admitting: Family Medicine

## 2021-08-05 NOTE — Telephone Encounter (Signed)
Spoke with patient and confirmed mailing address.  Labs from 07/25/21 printed and mailed as requested.  No further questions.

## 2021-08-05 NOTE — Telephone Encounter (Signed)
Pt is calling to ask if her labs can be mailed - she never received the first copy. Cb- [phone]

## 2021-08-19 ENCOUNTER — Other Ambulatory Visit: Payer: Self-pay

## 2021-08-19 ENCOUNTER — Ambulatory Visit (INDEPENDENT_AMBULATORY_CARE_PROVIDER_SITE_OTHER): Payer: Medicare Other | Admitting: Obstetrics & Gynecology

## 2021-08-19 ENCOUNTER — Encounter: Payer: Self-pay | Admitting: Obstetrics & Gynecology

## 2021-08-19 VITALS — BP 130/80 | Ht 65.0 in | Wt 141.0 lb

## 2021-08-19 DIAGNOSIS — N3281 Overactive bladder: Secondary | ICD-10-CM

## 2021-08-19 DIAGNOSIS — N812 Incomplete uterovaginal prolapse: Secondary | ICD-10-CM

## 2021-08-19 DIAGNOSIS — M545 Low back pain, unspecified: Secondary | ICD-10-CM | POA: Diagnosis not present

## 2021-08-19 LAB — POCT URINALYSIS DIPSTICK
Bilirubin, UA: NEGATIVE
Blood, UA: NEGATIVE
Glucose, UA: NEGATIVE
Ketones, UA: NEGATIVE
Leukocytes, UA: NEGATIVE
Nitrite, UA: NEGATIVE
Protein, UA: NEGATIVE
Spec Grav, UA: 1.01 (ref 1.010–1.025)
Urobilinogen, UA: 0.2 E.U./dL
pH, UA: 5 (ref 5.0–8.0)

## 2021-08-19 NOTE — Addendum Note (Signed)
Addended by: Gae Dry on: 08/19/2021 10:36 AM   Modules accepted: Orders

## 2021-08-19 NOTE — Progress Notes (Signed)
HPI:      [name] is a 87 y.o. G2P2 who presents today for her pessary follow up and examination related to her pelvic floor weakening.  Pt reports tolerating the pessary well with no vaginal bleeding and no vaginal discharge.  Symptoms of pelvic floor weakening have greatly improved. She is voiding and defecating without difficulty. She currently has a Ring#5 pessary.  She does report 2 weeks of urinary freq as well as low back pain, but this is now resolving.  PMHx: She  has a past medical history of Breast cancer ([place]) (2002), Cancer ([place]) (2002), Hyperlipidemia, Personal history of radiation therapy (2002), Shingles of eyelid, and Thyroid disease (09/2019). Also,  has a past surgical history that includes Breast surgery (Right, 2002); Colonoscopy with propofol (N/A, 05/06/2017); and Breast biopsy (Right, 2002)., family history includes Breast cancer (age of onset: 98) in her maternal aunt and maternal grandmother; Cancer in her father, maternal uncle, paternal aunt, paternal grandmother, paternal uncle, and paternal uncle; Heart attack in her mother.,  reports that she has never smoked. She has never used smokeless tobacco. She reports that she does not drink alcohol and does not use drugs.  She has a current medication list which includes the following prescription(s): ak-poly-bac, carboxymethylcellulose, combigan, durezol, famotidine, furosemide, gabapentin, ketotifen, levothyroxine, nitrofurantoin (macrocrystal-monohydrate), oxybutynin, polyethylene glycol powder, potassium chloride, prednisolone acetate, and valacyclovir. Also, is allergic to latex.  Review of Systems  Genitourinary:  Positive for frequency.  All other systems reviewed and are negative.  Objective: BP 130/80   Ht 5\' 5"  (1.651 m)   Wt 141 lb (64 kg)   BMI 23.46 kg/m  Physical Exam Constitutional:      General: She is not in acute distress.    Appearance: She is well-developed.  Genitourinary:     Right  Labia: No rash or tenderness.    Left Labia: No tenderness or rash.    No vaginal erythema or bleeding.      Right Adnexa: not tender and no mass present.    Left Adnexa: not tender and no mass present.    No cervical motion tenderness, discharge, polyp or nabothian cyst.     Uterus is not enlarged.     No uterine mass detected.    Pelvic exam was performed with patient in the lithotomy position.  HENT:     Head: Normocephalic and atraumatic.     Nose: Nose normal.  Abdominal:     General: There is no distension.     Palpations: Abdomen is soft.     Tenderness: There is no abdominal tenderness.  Musculoskeletal:        General: Normal range of motion.  Neurological:     Mental Status: She is alert and oriented to person, place, and time.     Cranial Nerves: No cranial nerve deficit.  Skin:    General: Skin is warm and dry.  Psychiatric:        Attention and Perception: Attention normal.        Mood and Affect: Mood and affect normal.        Speech: Speech normal.        Behavior: Behavior normal.        Thought Content: Thought content normal.        Judgment: Judgment normal.    Pessary Care Pessary removed and cleaned.  Vagina checked - without erosions - pessary replaced.  UA NEG  A/P:   ICD-10-CM   1. Cystocele  with incomplete uterovaginal prolapse  N81.2     2. Overactive bladder  N32.81 POCT urinalysis dipstick    3. Acute low back pain without sciatica, unspecified back pain laterality  M54.50 POCT urinalysis dipstick    No sign of UTI today Consider restart Ditropan if she has episodes regularly of urinary frequency like she dis 2 weeks ago  Pessary was cleaned and replaced today. Instructions given for care. Concerning symptoms to observe for are counseled to patient. Follow up scheduled for 3 months.  A total of 20 minutes were spent face-to-face with the patient as well as preparation, review, communication, and documentation during this encounter.    Barnett Applebaum, MD, [facility], [facility] 08/19/2021  10:07 AM

## 2021-08-22 LAB — URINE CULTURE

## 2021-09-05 ENCOUNTER — Ambulatory Visit: Payer: Medicare Other | Admitting: Obstetrics & Gynecology

## 2021-10-17 ENCOUNTER — Encounter: Payer: Self-pay | Admitting: *Deleted

## 2021-10-17 NOTE — Progress Notes (Signed)
Will need office visit

## 2021-10-18 ENCOUNTER — Encounter: Payer: Self-pay | Admitting: *Deleted

## 2021-10-18 NOTE — Progress Notes (Signed)
Patient was scheduled for ov.

## 2021-10-18 NOTE — Progress Notes (Signed)
Established patient visit  I,April Miller,acting as a scribe for Wilhemena Durie, MD.,have documented all relevant documentation on the behalf of Wilhemena Durie, MD,as directed by  Wilhemena Durie, MD while in the presence of Wilhemena Durie, MD.   Patient: Renee Powell   DOB: Dec 14, 1933   87 y.o. Female  MRN: [id number] Visit Date: 10/22/2021  Today's healthcare provider: Wilhemena Durie, MD   Chief Complaint  Patient presents with   Follow-up   Hypertension   Subjective    HPI  Patient is here concerning recently elevated blood pressures.  Patient states she went to the dentist and it was 190/100 and she brings in some normal blood pressures from home but some that are in the 665 range systolic and some in the 993 range diastolic. He is having no chest pain palpitations syncope or presyncope. She continues to be bothered by herpes zoster that is affected her eye. Medications: Outpatient Medications Prior to Visit  Medication Sig   carboxymethylcellulose (REFRESH PLUS) 0.5 % SOLN 1 drop 3 (three) times daily as needed.   COMBIGAN 0.2-0.5 % ophthalmic solution    levothyroxine (SYNTHROID, LEVOTHROID) 88 MCG tablet Take 88 mcg daily before breakfast by mouth.   [DISCONTINUED] AK-POLY-BAC 500-10000 UNIT/GM OINT Apply to eye. (Patient not taking: Reported on 10/22/2021)   [DISCONTINUED] DUREZOL 0.05 % EMUL Place 1 drop into the left eye 2 (two) times daily. (Patient not taking: Reported on 10/22/2021)   [DISCONTINUED] famotidine (PEPCID) 20 MG tablet Take 1 tablet (20 mg total) by mouth daily. (Patient not taking: Reported on 10/22/2021)   [DISCONTINUED] furosemide (LASIX) 20 MG tablet Take 1 tablet (20 mg total) by mouth daily. (Patient not taking: Reported on 10/22/2021)   [DISCONTINUED] gabapentin (NEURONTIN) 100 MG capsule 1 tablet at bedtime for 1 week, Then add 1 tablet in the morning for 1 week, Then add 1 tablet in the afternoon (Patient not taking: Reported  on 10/22/2021)   [DISCONTINUED] ketotifen (ZADITOR) 0.025 % ophthalmic solution 1 drop 2 (two) times daily. (Patient not taking: Reported on 10/22/2021)   [DISCONTINUED] nitrofurantoin, macrocrystal-monohydrate, (MACROBID) 100 MG capsule Take 1 capsule (100 mg total) by mouth 2 (two) times daily. (Patient not taking: Reported on 10/22/2021)   [DISCONTINUED] oxybutynin (DITROPAN-XL) 10 MG 24 hr tablet Take 1 tablet (10 mg total) by mouth at bedtime. (Patient not taking: Reported on 10/22/2021)   [DISCONTINUED] polyethylene glycol powder (GLYCOLAX/MIRALAX) powder 17 grams a day as needed if constipated (Patient not taking: Reported on 10/22/2021)   [DISCONTINUED] potassium chloride (K-DUR) 10 MEQ tablet Take 1 tablet (10 mEq total) by mouth 2 (two) times daily. (Patient not taking: Reported on 10/22/2021)   [DISCONTINUED] prednisoLONE acetate (PRED FORTE) 1 % ophthalmic suspension Place 1 drop into the left eye 4 (four) times daily. (Patient not taking: Reported on 10/22/2021)   [DISCONTINUED] valACYclovir (VALTREX) 1000 MG tablet Take by mouth. (Patient not taking: Reported on 10/22/2021)   No facility-administered medications prior to visit.    Review of Systems  Constitutional:  Negative for appetite change, chills, fatigue and fever.  Respiratory:  Negative for chest tightness and shortness of breath.   Cardiovascular:  Negative for chest pain and palpitations.  Gastrointestinal:  Negative for abdominal pain, nausea and vomiting.  Neurological:  Negative for dizziness and weakness.   Last lipids Lab Results  Component Value Date   CHOL 166 05/04/2017   HDL 30 (L) 05/04/2017   LDLCALC 119 (H) 05/04/2017  TRIG 84 05/04/2017   CHOLHDL 5.5 05/04/2017       Objective    BP (!) 166/89 (BP Location: Left Arm, Patient Position: Sitting, Cuff Size: Normal)    Pulse (!) 53    Temp 98.3 F (36.8 C) (Temporal)    Resp 16    Wt 140 lb (63.5 kg)    SpO2 99%    BMI 23.30 kg/m  BP Readings from Last 3  Encounters:  10/22/21 (!) 166/89  10/18/21 (!) 180/82  10/17/21 (!) 187/82   Wt Readings from Last 3 Encounters:  10/22/21 140 lb (63.5 kg)  08/19/21 141 lb (64 kg)  07/25/21 139 lb 4.8 oz (63.2 kg)      Physical Exam Vitals reviewed.  Constitutional:      Appearance: She is well-developed.  HENT:     Head: Normocephalic and atraumatic.  Eyes:     General: No scleral icterus.    Conjunctiva/sclera: Conjunctivae normal.  Neck:     Thyroid: No thyromegaly.  Cardiovascular:     Rate and Rhythm: Normal rate and regular rhythm.     Heart sounds: Normal heart sounds.  Pulmonary:     Effort: Pulmonary effort is normal.     Breath sounds: Normal breath sounds.  Abdominal:     Palpations: Abdomen is soft.  Skin:    General: Skin is warm and dry.  Neurological:     Mental Status: She is alert and oriented to person, place, and time.  Psychiatric:        Mood and Affect: Mood normal.        Behavior: Behavior normal.        Thought Content: Thought content normal.        Judgment: Judgment normal.      No results found for any visits on 10/22/21.  Assessment & Plan     1. Primary hypertension Start amlodipine 5  2. Hypothyroidism, unspecified type Followed by endocrinology  3. Herpes zoster of eye Followed by ophthalmology  4. Hyperlipidemia, unspecified hyperlipidemia type Follow-up lipids when appropriate.   No follow-ups on file.      I, Wilhemena Durie, MD, have reviewed all documentation for this visit. The documentation on 10/22/21 for the exam, diagnosis, procedures, and orders are all accurate and complete.    Mayara Paulson Cranford Mon, MD  [facility] [phone] (phone) [phone] (fax)  [place]

## 2021-10-21 ENCOUNTER — Other Ambulatory Visit: Payer: Self-pay | Admitting: Obstetrics & Gynecology

## 2021-10-22 ENCOUNTER — Encounter: Payer: Self-pay | Admitting: Family Medicine

## 2021-10-22 ENCOUNTER — Ambulatory Visit: Payer: Medicare Other | Admitting: Family Medicine

## 2021-10-22 ENCOUNTER — Other Ambulatory Visit: Payer: Self-pay

## 2021-10-22 VITALS — BP 166/89 | HR 53 | Temp 98.3°F | Resp 16 | Wt 140.0 lb

## 2021-10-22 DIAGNOSIS — B0230 Zoster ocular disease, unspecified: Secondary | ICD-10-CM

## 2021-10-22 DIAGNOSIS — E039 Hypothyroidism, unspecified: Secondary | ICD-10-CM | POA: Diagnosis not present

## 2021-10-22 DIAGNOSIS — I10 Essential (primary) hypertension: Secondary | ICD-10-CM

## 2021-10-22 DIAGNOSIS — E785 Hyperlipidemia, unspecified: Secondary | ICD-10-CM | POA: Diagnosis not present

## 2021-10-22 MED ORDER — AMLODIPINE BESYLATE 5 MG PO TABS: 5.0000 mg | ORAL_TABLET | Freq: Every day | ORAL | 1 refills | Status: DC

## 2021-11-18 ENCOUNTER — Encounter: Payer: Self-pay | Admitting: Obstetrics & Gynecology

## 2021-11-18 ENCOUNTER — Other Ambulatory Visit: Payer: Self-pay

## 2021-11-18 ENCOUNTER — Ambulatory Visit (INDEPENDENT_AMBULATORY_CARE_PROVIDER_SITE_OTHER): Payer: Medicare Other | Admitting: Obstetrics & Gynecology

## 2021-11-18 VITALS — BP 160/80 | Ht 65.0 in | Wt 138.0 lb

## 2021-11-18 DIAGNOSIS — N3281 Overactive bladder: Secondary | ICD-10-CM

## 2021-11-18 DIAGNOSIS — N812 Incomplete uterovaginal prolapse: Secondary | ICD-10-CM

## 2021-11-18 NOTE — Progress Notes (Signed)
?HPI: ?     [name] is a 88 y.o. G2P2 who presents today for her pessary follow up and examination related to her pelvic floor weakening.  Pt reports tolerating the pessary well with no vaginal bleeding and no vaginal discharge.  Symptoms of pelvic floor weakening have greatly improved. She is voiding and defecating without difficulty. She currently has a Ring #5 pessary.  Continues w mild OAB.  Did not see response from Ditropan.  And Myrbetriq was too costly. ? ?PMHx: ?She  has a past medical history of Breast cancer ([place]) (2002), Cancer ([place]) (2002), Hyperlipidemia, Personal history of radiation therapy (2002), Shingles of eyelid, and Thyroid disease (09/2019). Also,  has a past surgical history that includes Breast surgery (Right, 2002); Colonoscopy with propofol (N/A, 05/06/2017); and Breast biopsy (Right, 2002)., family history includes Breast cancer (age of onset: 49) in her maternal aunt and maternal grandmother; Cancer in her father, maternal uncle, paternal aunt, paternal grandmother, paternal uncle, and paternal uncle; Heart attack in her mother.,  reports that she has never smoked. She has never used smokeless tobacco. She reports that she does not drink alcohol and does not use drugs. ? ?She has a current medication list which includes the following prescription(s): amlodipine, carboxymethylcellulose, combigan, and levothyroxine. Also, is allergic to latex. ? ?Review of Systems  ?All other systems reviewed and are negative. ? ?Objective: ?BP (!) 180/90   Ht '5\' 5"'$  (1.651 m)   Wt 138 lb (62.6 kg)   BMI 22.96 kg/m?  ?Physical Exam ?Constitutional:   ?   General: She is not in acute distress. ?   Appearance: She is well-developed.  ?Genitourinary:  ?   Right Labia: No rash or tenderness. ?   Left Labia: No tenderness or rash. ?   No vaginal erythema or bleeding.  ? ?   Right Adnexa: not tender and no mass present. ?   Left Adnexa: not tender and no mass present. ?   No cervical motion  tenderness, discharge, polyp or nabothian cyst.  ?   Uterus is not enlarged.  ?   No uterine mass detected. ?   Uterus exam comments: Small, grade 2 POP.  ?   Uterus is midaxial.  ?   Bladder exam comments: Gr2 cystocele ?.  ?   Pelvic exam was performed with patient in the lithotomy position.  ?HENT:  ?   Head: Normocephalic and atraumatic.  ?   Nose: Nose normal.  ?Abdominal:  ?   General: There is no distension.  ?   Palpations: Abdomen is soft.  ?   Tenderness: There is no abdominal tenderness.  ?Musculoskeletal:     ?   General: Normal range of motion.  ?Neurological:  ?   Mental Status: She is alert and oriented to person, place, and time.  ?   Cranial Nerves: No cranial nerve deficit.  ?Skin: ?   General: Skin is warm and dry.  ?Psychiatric:     ?   Attention and Perception: Attention normal.     ?   Mood and Affect: Mood and affect normal.     ?   Speech: Speech normal.     ?   Behavior: Behavior normal.     ?   Thought Content: Thought content normal.     ?   Judgment: Judgment normal.  ? ? ?Pessary Care ?Pessary removed and cleaned.  Vagina checked - without erosions - pessary replaced. ? ?A/P: ?  ICD-10-CM   ?  1. Cystocele with incomplete uterovaginal prolapse  N81.2   ?  ?2. Overactive bladder  N32.81   ?  ? ?Pessary was cleaned and replaced today. ?Instructions given for care. ?Concerning symptoms to observe for are counseled to patient. ?Follow up scheduled for 3 months. ? ?A total of 21 minutes were spent face-to-face with the patient as well as preparation, review, communication, and documentation during this encounter.  ? ?Barnett Applebaum, MD, [place] ?[facility], [place] ?11/18/2021  2:54 PM ? ?

## 2021-11-25 ENCOUNTER — Telehealth: Payer: Self-pay

## 2021-11-25 NOTE — Telephone Encounter (Signed)
Pt calling; [place] told her he would rx some pills for her to take to keep her from going to the bathroom so much at night; states she got up 4-5 times last night; would now like to try the pills he told her about.  [phone]  courtesy call to pt; Vincent not in office today but is in tomorrow and msg will be send to him. ?

## 2021-11-26 ENCOUNTER — Telehealth: Payer: Self-pay

## 2021-11-26 ENCOUNTER — Other Ambulatory Visit: Payer: Self-pay | Admitting: Obstetrics & Gynecology

## 2021-11-26 MED ORDER — TOLTERODINE TARTRATE ER 2 MG PO CP24: 2.0000 mg | ORAL_CAPSULE | Freq: Every day | ORAL | 3 refills | Status: DC

## 2021-11-26 MED ORDER — TOLTERODINE TARTRATE 2 MG PO TABS: 2.0000 mg | ORAL_TABLET | Freq: Two times a day (BID) | ORAL | 11 refills | Status: DC

## 2021-11-26 NOTE — Telephone Encounter (Signed)
Pt calling; pharm states they recv'd another rx for capsules; pt needs tablets.  [place]; spoke with Vicente Males who states the system linked the two rxs together; after looking further she found the rx for the tablet form.  Pt aware. ?

## 2021-11-26 NOTE — Telephone Encounter (Signed)
Pt aware; has nodules in throat and neck; has had iodine radiation twice and capsules do not work for her; can she have pills? ?

## 2021-11-26 NOTE — Telephone Encounter (Signed)
Let her know will try medicine Detrol LA 2 mg daily, one she has not taken before that will hopefully provide her some relief in symptoms.  Main side effect is dry mouth.

## 2021-11-26 NOTE — Telephone Encounter (Signed)
Pt aware.

## 2021-11-26 NOTE — Telephone Encounter (Signed)
Changed to pills, which have to be taken twice daily

## 2021-11-27 ENCOUNTER — Other Ambulatory Visit: Payer: Self-pay | Admitting: Obstetrics & Gynecology

## 2021-12-12 ENCOUNTER — Telehealth: Payer: Self-pay | Admitting: Family Medicine

## 2021-12-12 NOTE — Telephone Encounter (Signed)
Copied from [place] [id number]. Topic: Medicare AWV ?>> Dec 12, 2021  2:20 PM Cher Nakai R wrote: ?Reason for CRM:  ?Left message for patient to call back and schedule Medicare Annual Wellness Visit (AWV) in office.  ? ?If unable to come into the office for AWV,  please offer to do virtually or by telephone. ? ?Last AWV: 10/25/2020 ? ?Please schedule at anytime with [place]. ? ?30 minute appointment for Virtual or phone ?45 minute appointment for in office or Initial virtual/phone ? ?Any questions, please contact me at [phone] ?

## 2022-01-07 ENCOUNTER — Ambulatory Visit (INDEPENDENT_AMBULATORY_CARE_PROVIDER_SITE_OTHER): Payer: Medicare Other

## 2022-01-07 VITALS — Wt 138.0 lb

## 2022-01-07 DIAGNOSIS — Z0000 Encounter for general adult medical examination without abnormal findings: Secondary | ICD-10-CM | POA: Diagnosis not present

## 2022-01-07 NOTE — Patient Instructions (Addendum)
Ms. Sherrill , ?Thank you for taking time to come for your Medicare Wellness Visit. I appreciate your ongoing commitment to your health goals. Please review the following plan we discussed and let me know if I can assist you in the future.  ? ?Screening recommendations/referrals: ?Colonoscopy: aged out ?Mammogram: aged out ?Bone Density: aged out ?Recommended yearly ophthalmology/optometry visit for glaucoma screening and checkup ?Recommended yearly dental visit for hygiene and checkup ? ?Vaccinations: ?Influenza vaccine: n/d ?Pneumococcal vaccine: n/d ?Tdap vaccine: n/d ?Shingles vaccine: n/d   ?Covid-19:11/09/19, 11/30/19 ? ?Advanced directives: no ? ?Conditions/risks identified: none ? ?Next appointment: Follow up in one year for your annual wellness visit 5/6/4 @ 9am by phone ? ? ?Preventive Care 86 Years and Older, Female ?Preventive care refers to lifestyle choices and visits with your health care provider that can promote health and wellness. ?What does preventive care include? ?A yearly physical exam. This is also called an annual well check. ?Dental exams once or twice a year. ?Routine eye exams. Ask your health care provider how often you should have your eyes checked. ?Personal lifestyle choices, including: ?Daily care of your teeth and gums. ?Regular physical activity. ?Eating a healthy diet. ?Avoiding tobacco and drug use. ?Limiting alcohol use. ?Practicing safe sex. ?Taking low-dose aspirin every day. ?Taking vitamin and mineral supplements as recommended by your health care provider. ?What happens during an annual well check? ?The services and screenings done by your health care provider during your annual well check will depend on your age, overall health, lifestyle risk factors, and family history of disease. ?Counseling  ?Your health care provider may ask you questions about your: ?Alcohol use. ?Tobacco use. ?Drug use. ?Emotional well-being. ?Home and relationship well-being. ?Sexual activity. ?Eating  habits. ?History of falls. ?Memory and ability to understand (cognition). ?Work and work Statistician. ?Reproductive health. ?Screening  ?You may have the following tests or measurements: ?Height, weight, and BMI. ?Blood pressure. ?Lipid and cholesterol levels. These may be checked every 5 years, or more frequently if you are over 45 years old. ?Skin check. ?Lung cancer screening. You may have this screening every year starting at age 67 if you have a 30-[place]-year history of smoking and currently smoke or have quit within the past 15 years. ?Fecal occult blood test (FOBT) of the stool. You may have this test every year starting at age 80. ?Flexible sigmoidoscopy or colonoscopy. You may have a sigmoidoscopy every 5 years or a colonoscopy every 10 years starting at age 84. ?Hepatitis C blood test. ?Hepatitis B blood test. ?Sexually transmitted disease (STD) testing. ?Diabetes screening. This is done by checking your blood sugar (glucose) after you have not eaten for a while (fasting). You may have this done every 1-3 years. ?Bone density scan. This is done to screen for osteoporosis. You may have this done starting at age 32. ?Mammogram. This may be done every 1-2 years. Talk to your health care provider about how often you should have regular mammograms. ?Talk with your health care provider about your test results, treatment options, and if necessary, the need for more tests. ?Vaccines  ?Your health care provider may recommend certain vaccines, such as: ?Influenza vaccine. This is recommended every year. ?Tetanus, diphtheria, and acellular pertussis (Tdap, Td) vaccine. You may need a Td booster every 10 years. ?Zoster vaccine. You may need this after age 39. ?Pneumococcal 13-valent conjugate (PCV13) vaccine. One dose is recommended after age 97. ?Pneumococcal polysaccharide (PPSV23) vaccine. One dose is recommended after age 69. ?Talk to your health  care provider about which screenings and vaccines you need and how  often you need them. ?This information is not intended to replace advice given to you by your health care provider. Make sure you discuss any questions you have with your health care provider. ?Document Released: 09/21/2015 Document Revised: 05/14/2016 Document Reviewed: 06/26/2015 ?Elsevier Interactive Patient Education ? 2017 [place]. ? ?Fall Prevention in the Home ?Falls can cause injuries. They can happen to people of all ages. There are many things you can do to make your home safe and to help prevent falls. ?What can I do on the outside of my home? ?Regularly fix the edges of walkways and driveways and fix any cracks. ?Remove anything that might make you trip as you walk through a door, such as a raised step or threshold. ?Trim any bushes or trees on the path to your home. ?Use bright outdoor lighting. ?Clear any walking paths of anything that might make someone trip, such as rocks or tools. ?Regularly check to see if handrails are loose or broken. Make sure that both sides of any steps have handrails. ?Any raised decks and porches should have guardrails on the edges. ?Have any leaves, snow, or ice cleared regularly. ?Use sand or salt on walking paths during winter. ?Clean up any spills in your garage right away. This includes oil or grease spills. ?What can I do in the bathroom? ?Use night lights. ?Install grab bars by the toilet and in the tub and shower. Do not use towel bars as grab bars. ?Use non-skid mats or decals in the tub or shower. ?If you need to sit down in the shower, use a plastic, non-slip stool. ?Keep the floor dry. Clean up any water that spills on the floor as soon as it happens. ?Remove soap buildup in the tub or shower regularly. ?Attach bath mats securely with double-sided non-slip rug tape. ?Do not have throw rugs and other things on the floor that can make you trip. ?What can I do in the bedroom? ?Use night lights. ?Make sure that you have a light by your bed that is easy to  reach. ?Do not use any sheets or blankets that are too big for your bed. They should not hang down onto the floor. ?Have a firm chair that has side arms. You can use this for support while you get dressed. ?Do not have throw rugs and other things on the floor that can make you trip. ?What can I do in the kitchen? ?Clean up any spills right away. ?Avoid walking on wet floors. ?Keep items that you use a lot in easy-to-reach places. ?If you need to reach something above you, use a strong step stool that has a grab bar. ?Keep electrical cords out of the way. ?Do not use floor polish or wax that makes floors slippery. If you must use wax, use non-skid floor wax. ?Do not have throw rugs and other things on the floor that can make you trip. ?What can I do with my stairs? ?Do not leave any items on the stairs. ?Make sure that there are handrails on both sides of the stairs and use them. Fix handrails that are broken or loose. Make sure that handrails are as long as the stairways. ?Check any carpeting to make sure that it is firmly attached to the stairs. Fix any carpet that is loose or worn. ?Avoid having throw rugs at the top or bottom of the stairs. If you do have throw rugs, attach them to  the floor with carpet tape. ?Make sure that you have a light switch at the top of the stairs and the bottom of the stairs. If you do not have them, ask someone to add them for you. ?What else can I do to help prevent falls? ?Wear shoes that: ?Do not have high heels. ?Have rubber bottoms. ?Are comfortable and fit you well. ?Are closed at the toe. Do not wear sandals. ?If you use a stepladder: ?Make sure that it is fully opened. Do not climb a closed stepladder. ?Make sure that both sides of the stepladder are locked into place. ?Ask someone to hold it for you, if possible. ?Clearly mark and make sure that you can see: ?Any grab bars or handrails. ?First and last steps. ?Where the edge of each step is. ?Use tools that help you move  around (mobility aids) if they are needed. These include: ?Canes. ?Walkers. ?Scooters. ?Crutches. ?Turn on the lights when you go into a dark area. Replace any light bulbs as soon as they burn out. ?Set up your furni

## 2022-01-07 NOTE — Progress Notes (Signed)
Virtual Visit via Telephone Note  I connected with  Renee Powell on 01/07/22 at  9:00 AM EDT by telephone and verified that I am speaking with the correct person using two identifiers.  Location: Patient: home Provider: [facility] Persons participating in the virtual visit: [place]   I discussed the limitations, risks, security and privacy concerns of performing an evaluation and management service by telephone and the availability of in person appointments. The patient expressed understanding and agreed to proceed.  Interactive audio and video telecommunications were attempted between this nurse and patient, however failed, due to patient having technical difficulties OR patient did not have access to video capability.  We continued and completed visit with audio only.  Some vital signs may be absent or patient reported.   Dionisio David, LPN  Subjective:   Renee Powell is a 88 y.o. female who presents for Medicare Annual (Subsequent) preventive examination.  Review of Systems           Objective:    There were no vitals filed for this visit. There is no height or weight on file to calculate BMI.     10/25/2020    9:11 AM 03/25/2018   11:21 AM 05/03/2017    2:01 PM 05/03/2017    6:35 AM 05/01/2017    2:59 PM 03/24/2016   10:56 AM 03/22/2015   11:23 AM  Advanced Directives  Does Patient Have a Medical Advance Directive? Yes Yes Yes Yes No No Yes  Type of Paramedic of [place];Living will [place];Living will [place];Living will [place];Living will   [place];Living will  Does patient want to make changes to medical advance directive?   No - Patient declined No - Patient declined   No - Patient declined  Copy of [place] in Chart? Yes - validated most recent copy scanned in chart (See row information) Yes No - copy requested No - copy  requested   Yes  Would patient like information on creating a medical advance directive?   No - Patient declined No - Patient declined No - Patient declined No - patient declined information     Current Medications (verified) Outpatient Encounter Medications as of 01/07/2022  Medication Sig   amLODipine (NORVASC) 5 MG tablet Take 1 tablet (5 mg total) by mouth daily.   carboxymethylcellulose (REFRESH PLUS) 0.5 % SOLN 1 drop 3 (three) times daily as needed.   COMBIGAN 0.2-0.5 % ophthalmic solution    levothyroxine (SYNTHROID, LEVOTHROID) 88 MCG tablet Take 88 mcg daily before breakfast by mouth.   PREDNISOLONE ACETATE P-F 1 % ophthalmic suspension Place 1 drop into the left eye 3 (three) times daily.   tolterodine (DETROL) 2 MG tablet Take 1 tablet (2 mg total) by mouth 2 (two) times daily.   No facility-administered encounter medications on file as of 01/07/2022.    Allergies (verified) Latex   History: Past Medical History:  Diagnosis Date   Breast cancer ([place]) 2002   right lumpectomy with radiation   Cancer ([place]) 2002   breast   Hyperlipidemia    Personal history of radiation therapy 2002   Right breast   Shingles of eyelid    Thyroid disease 09/2019   Pt had tumors treated with radiation therapy   Past Surgical History:  Procedure Laterality Date   BREAST BIOPSY Right 2002   breast ca radation   BREAST SURGERY Right 2002  lumpectomy   COLONOSCOPY WITH PROPOFOL N/A 05/06/2017   Procedure: COLONOSCOPY WITH PROPOFOL;  Surgeon: [name], MD;  [facility] ENDOSCOPY;  Service: Gastroenterology;  Laterality: N/A;   Family History  Problem Relation Age of Onset   Cancer Father        lung cancer   Cancer Maternal Uncle    Cancer Paternal Aunt        Breast   Cancer Paternal Uncle        Kidney   Cancer Paternal Uncle        Lung   Heart attack Mother    Cancer Paternal Grandmother        Breast   Breast cancer Maternal Aunt 60   Breast cancer Maternal  Grandmother 60   Social History   Socioeconomic History   Marital status: Widowed    Spouse name: Not on file   Number of children: 2   Years of education: Not on file   Highest education level: 12th grade  Occupational History   Occupation: retired  Tobacco Use   Smoking status: Never   Smokeless tobacco: Never  Vaping Use   Vaping Use: Never used  Substance and Sexual Activity   Alcohol use: No    Alcohol/week: 0.0 standard drinks   Drug use: No   Sexual activity: Not Currently  Other Topics Concern   Not on file  Social History Narrative   Not on file   Social Determinants of Health   Financial Resource Strain: Not on file  Food Insecurity: Not on file  Transportation Needs: Not on file  Physical Activity: Not on file  Stress: Not on file  Social Connections: Not on file    Tobacco Counseling Counseling given: Not Answered   Clinical Intake:  Pre-visit preparation completed: Yes  Pain : No/denies pain     Nutritional Risks: None Diabetes: No  How often do you need to have someone help you when you read instructions, pamphlets, or other written materials from your doctor or pharmacy?: 1 - Never  Diabetic?no  Interpreter Needed?: No  Information entered by :: Kirke Shaggy, LPN   Activities of Daily Living    10/22/2021    8:46 AM 07/25/2021    9:36 AM  In your present state of health, do you have any difficulty performing the following activities:  Hearing? 1 1  Vision? 1 1  Difficulty concentrating or making decisions? 1 0  Walking or climbing stairs? 0 0  Dressing or bathing? 0 0  Doing errands, shopping? 0 0    Patient Care Team: Jerrol Banana., MD as PCP - General (Family Medicine) Ronnald Collum, Lourdes Sledge, MD as Attending Physician (Endocrinology) Gae Dry, MD (Inactive) as Referring Physician (Obstetrics and Gynecology) Leandrew Koyanagi, MD as Referring Physician (Ophthalmology)  Indicate any recent Medical Services  you may have received from other than [facility] providers in the past year (date may be approximate).     Assessment:   This is a routine wellness examination for Renee Powell.  Hearing/Vision screen No results found.  Dietary issues and exercise activities discussed:     Goals Addressed   None    Depression Screen    10/22/2021    8:45 AM 07/25/2021    9:36 AM 10/25/2020    9:09 AM 03/25/2018   11:22 AM 05/14/2017    1:55 PM 02/13/2016    9:57 AM 02/13/2016    9:43 AM  PHQ 2/9 Scores  PHQ - 2 Score 0  0 2 0 0 0 0  PHQ- 9 Score '2 6 6        '$ Fall Risk    10/22/2021    8:45 AM 07/25/2021    9:35 AM 10/25/2020    9:11 AM 03/25/2018   11:22 AM 05/14/2017    1:55 PM  [place] in the past year? 0 1 0 Yes No  Number falls in past yr: 0 0 0 1   Injury with Fall? 0 0 0 No   Risk for fall due to : Impaired balance/gait History of fall(s)     Follow up Falls evaluation completed Follow up appointment  Falls prevention discussed     FALL RISK PREVENTION PERTAINING TO THE HOME:  Any stairs in or around the home? No  If so, are there any without handrails? No  Home free of loose throw rugs in walkways, pet beds, electrical cords, etc? Yes  Adequate lighting in your home to reduce risk of falls? Yes   ASSISTIVE DEVICES UTILIZED TO PREVENT FALLS:  Life alert? No  Use of a cane, walker or w/c? No  Grab bars in the bathroom? Yes  Shower chair or bench in shower? Yes  Elevated toilet seat or a handicapped toilet? Yes   Cognitive Function:        Immunizations Immunization History  Administered Date(s) Administered   PFIZER(Purple Top)SARS-COV-2 Vaccination 11/09/2019, 11/30/2019    TDAP status: Due, Education has been provided regarding the importance of this vaccine. Advised may receive this vaccine at local pharmacy or Health Dept. Aware to provide a copy of the vaccination record if obtained from local pharmacy or Health Dept. Verbalized acceptance and understanding.  Flu  Vaccine status: Declined, Education has been provided regarding the importance of this vaccine but patient still declined. Advised may receive this vaccine at local pharmacy or Health Dept. Aware to provide a copy of the vaccination record if obtained from local pharmacy or Health Dept. Verbalized acceptance and understanding.  Pneumococcal vaccine status: Declined,  Education has been provided regarding the importance of this vaccine but patient still declined. Advised may receive this vaccine at local pharmacy or Health Dept. Aware to provide a copy of the vaccination record if obtained from local pharmacy or Health Dept. Verbalized acceptance and understanding.   Covid-19 vaccine status: Completed vaccines  Qualifies for Shingles Vaccine? Yes   Zostavax completed No   Shingrix Completed?: No.    Education has been provided regarding the importance of this vaccine. Patient has been advised to call insurance company to determine out of pocket expense if they have not yet received this vaccine. Advised may also receive vaccine at local pharmacy or Health Dept. Verbalized acceptance and understanding.  Screening Tests Health Maintenance  Topic Date Due   TETANUS/TDAP  Never done   Zoster Vaccines- Shingrix (1 of 2) Never done   Pneumonia Vaccine 52+ Years old (1 - PCV) Never done   DEXA SCAN  01/01/2011   COVID-19 Vaccine (3 - Pfizer risk series) 12/28/2019   INFLUENZA VACCINE  04/08/2022   HPV VACCINES  Aged Out    Health Maintenance  Health Maintenance Due  Topic Date Due   TETANUS/TDAP  Never done   Zoster Vaccines- Shingrix (1 of 2) Never done   Pneumonia Vaccine 43+ Years old (1 - PCV) Never done   DEXA SCAN  01/01/2011   COVID-19 Vaccine (3 - Pfizer risk series) 12/28/2019    Colorectal cancer screening: No longer required.  Mammogram status: No longer required due to age.    Lung Cancer Screening: (Low Dose CT Chest recommended if Age 15-80 years, 30 [place]azer-year currently  smoking OR have quit w/in 15years.) does not qualify.   Additional Screening:  Hepatitis C Screening: does not qualify; Completed no  Vision Screening: Recommended annual ophthalmology exams for early detection of glaucoma and other disorders of the eye. Is the patient up to date with their annual eye exam?  Yes  Who is the provider or what is the name of the office in which the patient attends annual eye exams? [facility] If pt is not established with a provider, would they like to be referred to a provider to establish care? No .   Dental Screening: Recommended annual dental exams for proper oral hygiene  Community Resource Referral / Chronic Care Management: CRR required this visit?  No   CCM required this visit?  No      Plan:     I have personally reviewed and noted the following in the patient's chart:   Medical and social history Use of alcohol, tobacco or illicit drugs  Current medications and supplements including opioid prescriptions.  Functional ability and status Nutritional status Physical activity Advanced directives List of other physicians Hospitalizations, surgeries, and ER visits in previous 12 months Vitals Screenings to include cognitive, depression, and falls Referrals and appointments  In addition, I have reviewed and discussed with patient certain preventive protocols, quality metrics, and best practice recommendations. A written personalized care plan for preventive services as well as general preventive health recommendations were provided to patient.     Dionisio David, LPN   8/0/9983   Nurse Notes: none

## 2022-01-14 ENCOUNTER — Telehealth: Payer: Self-pay

## 2022-01-14 ENCOUNTER — Other Ambulatory Visit: Payer: Self-pay | Admitting: Obstetrics and Gynecology

## 2022-01-14 NOTE — Telephone Encounter (Signed)
[facility] sent a fax, pt requesting Qxybutynin Cl '10mg'$  tab, This rx is only 12 dollars and the current one is 63 dollars. Can you prescribe this since she was a pt of [facility]? ?

## 2022-01-14 NOTE — Telephone Encounter (Signed)
The oxybutynin is one she did in 2021 that didn't work for her so [facility] changed. Pls discuss with pt.

## 2022-01-16 ENCOUNTER — Other Ambulatory Visit: Payer: Self-pay | Admitting: Obstetrics and Gynecology

## 2022-01-16 MED ORDER — OXYBUTYNIN CHLORIDE ER 10 MG PO TB24: 10.0000 mg | ORAL_TABLET | Freq: Every day | ORAL | 2 refills | Status: DC

## 2022-01-16 NOTE — Progress Notes (Signed)
Rx oxybutynin 10 mg QHS eRxd. Pt with OAB sx worse at night. Rx didn't work well in past, but detrol and myrbetriq too expensive. Pt also needs pessary f/u and will refer to [facility] to schedule.  ?

## 2022-01-16 NOTE — Telephone Encounter (Signed)
Pt needs pessary appt. Pls call her either way re: status of getting this scheduled. I told her we were limited with providers who did pessaries.

## 2022-01-16 NOTE — Telephone Encounter (Signed)
Rx RF oxybutynin. Discussed with pt. See orders note ?

## 2022-01-17 NOTE — Telephone Encounter (Signed)
Patient is scheduled for 01/23/22 with [facility]

## 2022-01-23 ENCOUNTER — Ambulatory Visit (INDEPENDENT_AMBULATORY_CARE_PROVIDER_SITE_OTHER): Payer: Medicare Other | Admitting: Obstetrics

## 2022-01-23 ENCOUNTER — Encounter: Payer: Self-pay | Admitting: Obstetrics

## 2022-01-23 VITALS — BP 118/70 | Ht 64.0 in

## 2022-01-23 DIAGNOSIS — Z4689 Encounter for fitting and adjustment of other specified devices: Secondary | ICD-10-CM | POA: Diagnosis not present

## 2022-01-23 DIAGNOSIS — L819 Disorder of pigmentation, unspecified: Secondary | ICD-10-CM | POA: Diagnosis not present

## 2022-01-23 DIAGNOSIS — N812 Incomplete uterovaginal prolapse: Secondary | ICD-10-CM

## 2022-01-23 DIAGNOSIS — B0239 Other herpes zoster eye disease: Secondary | ICD-10-CM | POA: Diagnosis not present

## 2022-01-23 NOTE — Patient Instructions (Signed)
Have a great year! Please call with any concerns. Don't forget to wear your seatbelt everyday! If you are not signed up on MyChart, please ask [place] how to sign up for it!   In a world where you can be anything, please be kind.   Body mass index is 23.69 kg/m.  A Healthy Lifestyle: Care Instructions Your Care Instructions  A healthy lifestyle can help you feel good, stay at a healthy weight, and have plenty of energy for both work and play. A healthy lifestyle is something you can share with your whole family. A healthy lifestyle also can lower your risk for serious health problems, such as high blood pressure, heart disease, and diabetes. You can follow a few steps listed below to improve your health and the health of your family. Follow-up care is a key part of your treatment and safety. Be sure to make and go to all appointments, and call your doctor if you are having problems. It's also a good idea to know your test results and keep a list of the medicines you take. How can you care for yourself at home? Do not eat too much sugar, fat, or fast foods. You can still have dessert and treats now and then. The goal is moderation. Start small to improve your eating habits. Pay attention to portion sizes, drink less juice and soda pop, and eat more fruits and vegetables. Eat a healthy amount of food. A 3-ounce serving of meat, for example, is about the size of a deck of cards. Fill the rest of your plate with vegetables and whole grains. Limit the amount of soda and sports drinks you have every day. Drink more water when you are thirsty. Eat at least 5 servings of fruits and vegetables every day. It may seem like a lot, but it is not hard to reach this goal. A serving or helping is 1 piece of fruit, 1 cup of vegetables, or 2 cups of leafy, raw vegetables. Have an apple or some carrot sticks as an afternoon snack instead of a candy bar. Try to have fruits and/or vegetables at every meal. Make exercise  part of your daily routine. You may want to start with simple activities, such as walking, bicycling, or slow swimming. Try to be active 30 to 60 minutes every day. You do not need to do all 30 to 60 minutes all at once. For example, you can exercise 3 times a day for 10 or 20 minutes. Moderate exercise is safe for most people, but it is always a good idea to talk to your doctor before starting an exercise program. Keep moving. Mow the lawn, work in the garden, or [employer]. Take the stairs instead of the elevator at work. If you smoke, quit. People who smoke have an increased risk for heart attack, stroke, cancer, and other lung illnesses. Quitting is hard, but there are ways to boost your chance of quitting tobacco for good. Use nicotine gum, patches, or lozenges. Ask your doctor about stop-smoking programs and medicines. Keep trying. In addition to reducing your risk of diseases in the future, you will notice some benefits soon after you stop using tobacco. If you have shortness of breath or asthma symptoms, they will likely get better within a few weeks after you quit. Limit how much alcohol you drink. Moderate amounts of alcohol (up to 2 drinks a day for men, 1 drink a day for women) are okay. But drinking too much can lead to  liver problems, high blood pressure, and other health problems. Family health If you have a family, there are many things you can do together to improve your health. Eat meals together as a family as often as possible. Eat healthy foods. This includes fruits, vegetables, lean meats and dairy, and whole grains. Include your family in your fitness plan. Most people think of activities such as jogging or tennis as the way to fitness, but there are many ways you and your family can be more active. Anything that makes you breathe hard and gets your heart pumping is exercise. Here are some tips: Walk to do errands or to take your child to school or the bus. Go for a family  bike ride after dinner instead of watching TV. Care instructions adapted under license by your healthcare professional. This care instruction is for use with your licensed healthcare professional. If you have questions about a medical condition or this instruction, always ask your healthcare professional. [place] any warranty or liability for your use of this information.

## 2022-01-23 NOTE — Progress Notes (Signed)
Chief Complaint  Patient presents with   Pessary Check   Renee Powell is a 88 y.o. G2P2 No LMP recorded. Patient is postmenopausal. who presents for pessary maintenance. Patient is now on antihypertensives. Patient reports she has been evaluated 30 times for shingles in the last 2 years. In that time she developed hypertension. She has been on prednisone for a large portion of the year. She will soon start ditropan for stress incontinence.   Patient denies postmenopausal bleeding. Patient is not sexually active. Patient denies vaginal discharge, odor, burning or itching. Patient denies urinary symptoms.   Physical Exam: BP 118/70   Ht '5\' 4"'$  (1.626 m)   BMI 23.69 kg/m  Body mass index is 23.69 kg/m. Physical Exam Vitals and nursing note reviewed. Exam conducted with a chaperone present.  Constitutional:      General: She is not in acute distress.    Appearance: Normal appearance.  HENT:     Head: Normocephalic and atraumatic.  Eyes:     Extraocular Movements: Extraocular movements intact.  Neck:     Thyroid: No thyromegaly.  Pulmonary:     Effort: Pulmonary effort is normal.  Chest:  Breasts:    Right: Normal. No nipple discharge.     Left: Normal.  Genitourinary:    Exam position: Lithotomy position.     Pubic Area: No rash.      Labia:        Right: Lesion present. No tenderness.        Left: Lesion present. No tenderness.      Urethra: No prolapse.     Vagina: Foreign body present. Prolapsed vaginal walls present. No vaginal discharge or tenderness.     Cervix: No friability, lesion, erythema or cervical bleeding.     Uterus: Normal. With uterine prolapse. Not enlarged and not tender.        Comments: Atrophic External genitalia with normal hair distribution, no lesions noted Urethral meatus appears normal with no lesions  Urethra with no masses, tenderness, or scarring noted Bladder with no tenderness, grade 2 cystocele Vagina atrophic with decreased vaginal  rugae, no discharge or lesions noted, no erosions noted; pelvic relaxation with grade no significant rectocele  Cervix - small with no lesions noted, atrophic  Uterus - small, nontender, mobile with grade 2 uterine prolapse Pessary removed, cleansed, vagina rinsed and pessary replaced  Musculoskeletal:        General: Normal range of motion.     Cervical back: Normal range of motion.  Lymphadenopathy:     Upper Body:     Right upper body: No axillary adenopathy.     Left upper body: No axillary adenopathy.     Lower Body: No right inguinal adenopathy. No left inguinal adenopathy.  Skin:    General: Skin is warm and dry.  Neurological:     General: No focal deficit present.     Mental Status: She is alert and oriented to person, place, and time. Mental status is at baseline.  Psychiatric:        Mood and Affect: Mood normal.        Behavior: Behavior normal.   Assessment and Plan: Pessary maintenance  Changing pigmented skin lesion - Plan: Ambulatory referral to Dermatology  Cystocele with incomplete uterovaginal prolapse  Shingles of eyelid  UA not obtained today Patient stated she had no discomfort and was able to urinate before leaving without difficulty. Continue pessary #5 Ring with support  Return to office every 3 months  for pessary cleaning and vaginal exam Recommend patient discuss continuous use of daily valtrex with her PCP given recurrent shingles, if ok with hr other medications.  Derm referral for pigmented vulvar lesion, new per patient   Return in about 3 months (around 04/25/2022) for pessary check.

## 2022-01-28 ENCOUNTER — Encounter: Payer: Self-pay | Admitting: Family Medicine

## 2022-01-28 ENCOUNTER — Ambulatory Visit: Payer: Medicare Other | Admitting: Family Medicine

## 2022-01-28 VITALS — BP 142/62 | HR 65 | Resp 16 | Ht 65.0 in | Wt 136.0 lb

## 2022-01-28 DIAGNOSIS — E079 Disorder of thyroid, unspecified: Secondary | ICD-10-CM | POA: Diagnosis not present

## 2022-01-28 DIAGNOSIS — N812 Incomplete uterovaginal prolapse: Secondary | ICD-10-CM

## 2022-01-28 DIAGNOSIS — B0230 Zoster ocular disease, unspecified: Secondary | ICD-10-CM

## 2022-01-28 DIAGNOSIS — I10 Essential (primary) hypertension: Secondary | ICD-10-CM

## 2022-01-28 DIAGNOSIS — Z170 Estrogen receptor positive status [ER+]: Secondary | ICD-10-CM

## 2022-01-28 DIAGNOSIS — C50911 Malignant neoplasm of unspecified site of right female breast: Secondary | ICD-10-CM | POA: Diagnosis not present

## 2022-01-28 NOTE — Progress Notes (Signed)
Established patient visit  I,April Miller,acting as a scribe for Wilhemena Durie, MD.,have documented all relevant documentation on the behalf of Wilhemena Durie, MD,as directed by  Wilhemena Durie, MD while in the presence of Wilhemena Durie, MD.   Patient: Renee Powell   DOB: [birth year]   88 y.o. Female  MRN: [id number] Visit Date: 01/28/2022  Today's healthcare provider: Wilhemena Durie, MD   Chief Complaint  Patient presents with   Follow-up   Hypertension   Subjective    HPI  Patient has had her pessary checked and overall is doing well except for a complaint of ongoing eye problems from the shingles.  She is followed regularly by ophthalmology for this.  Hypertension, follow-up  BP Readings from Last 3 Encounters:  01/28/22 (!) 142/62  01/23/22 118/70  11/18/21 (!) 160/80   Wt Readings from Last 3 Encounters:  01/28/22 136 lb (61.7 kg)  01/07/22 138 lb (62.6 kg)  11/18/21 138 lb (62.6 kg)     She was last seen for hypertension 3 months ago.  Management since that visit includes; Started amlodipine 5 mg. Outside blood pressures are 118/70.  Pertinent labs Lab Results  Component Value Date   CHOL 166 05/04/2017   HDL 30 (L) 05/04/2017   LDLCALC 119 (H) 05/04/2017   TRIG 84 05/04/2017   CHOLHDL 5.5 05/04/2017   Lab Results  Component Value Date   NA 140 07/25/2021   K 4.2 07/25/2021   CREATININE 0.91 07/25/2021   EGFR 62 07/25/2021   GLUCOSE 82 07/25/2021   TSH 0.035 (L) 07/25/2021     The ASCVD Risk score (Arnett DK, et al., 2019) failed to calculate for the following reasons:   The 2019 ASCVD risk score is only valid for ages 25 to 44  ---------------------------------------------------------------------------------------------------   Medications: Outpatient Medications Prior to Visit  Medication Sig   amLODipine (NORVASC) 5 MG tablet Take 1 tablet (5 mg total) by mouth daily.   COMBIGAN 0.2-0.5 % ophthalmic solution     levothyroxine (SYNTHROID, LEVOTHROID) 88 MCG tablet Take 88 mcg daily before breakfast by mouth.   oxybutynin (DITROPAN XL) 10 MG 24 hr tablet Take 1 tablet (10 mg total) by mouth at bedtime.   PREDNISOLONE ACETATE P-F 1 % ophthalmic suspension Place 1 drop into the left eye 3 (three) times daily.   carboxymethylcellulose (REFRESH PLUS) 0.5 % SOLN 1 drop 3 (three) times daily as needed. (Patient not taking: Reported on 01/07/2022)   No facility-administered medications prior to visit.    Review of Systems  Constitutional:  Negative for appetite change, chills, fatigue and fever.  Respiratory:  Negative for chest tightness and shortness of breath.   Cardiovascular:  Negative for chest pain and palpitations.  Gastrointestinal:  Negative for abdominal pain, nausea and vomiting.  Neurological:  Negative for dizziness and weakness.       Objective    BP (!) 142/62 (BP Location: Left Arm, Patient Position: Sitting, Cuff Size: Normal)   Pulse 65   Resp 16   Ht 5' 5"  (1.651 m)   Wt 136 lb (61.7 kg)   SpO2 98%   BMI 22.63 kg/m  BP Readings from Last 3 Encounters:  01/28/22 (!) 142/62  01/23/22 118/70  11/18/21 (!) 160/80   Wt Readings from Last 3 Encounters:  01/28/22 136 lb (61.7 kg)  01/07/22 138 lb (62.6 kg)  11/18/21 138 lb (62.6 kg)      Physical Exam Vitals reviewed.  Constitutional:      Appearance: She is well-developed.  HENT:     Head: Normocephalic and atraumatic.  Eyes:     General: No scleral icterus.    Conjunctiva/sclera: Conjunctivae normal.  Neck:     Thyroid: No thyromegaly.  Cardiovascular:     Rate and Rhythm: Normal rate and regular rhythm.     Heart sounds: Normal heart sounds.  Pulmonary:     Effort: Pulmonary effort is normal.     Breath sounds: Normal breath sounds.  Abdominal:     Palpations: Abdomen is soft.  Genitourinary:    General: Normal vulva.     Comments: On the right posterior portion of the vulva there is some hyperpigmented areas  that is linear. Skin:    General: Skin is warm and dry.  Neurological:     General: No focal deficit present.     Mental Status: She is alert and oriented to person, place, and time.  Psychiatric:        Mood and Affect: Mood normal.        Behavior: Behavior normal.        Thought Content: Thought content normal.        Judgment: Judgment normal.      No results found for any visits on 01/28/22.  Assessment & Plan     1. Primary hypertension Fairly good control.  Home blood pressure readings 130/80  2. Herpes zoster with ophthalmic complication, unspecified herpes zoster eye disease Followed by ophthalmology More than 50% 25 minute visit spent in counseling or coordination of care  3.h/o Malignant neoplasm of right breast in female, estrogen receptor positive, unspecified site of breast ([place])   4. Disease of thyroid gland Followed by her [place].  Cystocele Pessary per GYN  No follow-ups on file.      I, Wilhemena Durie, MD, have reviewed all documentation for this visit. The documentation on 01/28/22 for the exam, diagnosis, procedures, and orders are all accurate and complete.    Aakash Hollomon Cranford Mon, MD  [facility] [phone] (phone) [phone] (fax)  [place]

## 2022-01-30 MED ORDER — VALACYCLOVIR HCL 500 MG PO TABS: 500.0000 mg | ORAL_TABLET | Freq: Every day | ORAL | 3 refills | Status: DC

## 2022-01-30 NOTE — Addendum Note (Signed)
Addended by: Eulas Post on: 01/30/2022 07:40 AM   Modules accepted: Orders

## 2022-03-19 ENCOUNTER — Ambulatory Visit (INDEPENDENT_AMBULATORY_CARE_PROVIDER_SITE_OTHER): Payer: Medicare Other | Admitting: Dermatology

## 2022-03-19 DIAGNOSIS — C44712 Basal cell carcinoma of skin of right lower limb, including hip: Secondary | ICD-10-CM

## 2022-03-19 DIAGNOSIS — D1800 Hemangioma unspecified site: Secondary | ICD-10-CM | POA: Diagnosis not present

## 2022-03-19 DIAGNOSIS — L570 Actinic keratosis: Secondary | ICD-10-CM | POA: Diagnosis not present

## 2022-03-19 DIAGNOSIS — L578 Other skin changes due to chronic exposure to nonionizing radiation: Secondary | ICD-10-CM

## 2022-03-19 DIAGNOSIS — L820 Inflamed seborrheic keratosis: Secondary | ICD-10-CM

## 2022-03-19 DIAGNOSIS — D485 Neoplasm of uncertain behavior of skin: Secondary | ICD-10-CM | POA: Diagnosis not present

## 2022-03-19 DIAGNOSIS — D229 Melanocytic nevi, unspecified: Secondary | ICD-10-CM

## 2022-03-19 DIAGNOSIS — L821 Other seborrheic keratosis: Secondary | ICD-10-CM | POA: Diagnosis not present

## 2022-03-19 DIAGNOSIS — D489 Neoplasm of uncertain behavior, unspecified: Secondary | ICD-10-CM

## 2022-03-19 DIAGNOSIS — C4491 Basal cell carcinoma of skin, unspecified: Secondary | ICD-10-CM

## 2022-03-19 HISTORY — DX: Basal cell carcinoma of skin, unspecified: C44.91

## 2022-03-19 NOTE — Patient Instructions (Addendum)
Biopsy Wound Care Instructions  Leave the original bandage on for 24 hours if possible.  If the bandage becomes soaked or soiled before that time, it is OK to remove it and examine the wound.  A small amount of post-operative bleeding is normal.  If excessive bleeding occurs, remove the bandage, place gauze over the site and apply continuous pressure (no peeking) over the area for 30 minutes. If this does not work, please call our clinic as soon as possible or page your doctor if it is after hours.   Once a day, cleanse the wound with soap and water. It is fine to shower. If a thick crust develops you may use a Q-tip dipped into dilute hydrogen peroxide (mix 1:1 with water) to dissolve it.  Hydrogen peroxide can slow the healing process, so use it only as needed.    After washing, apply petroleum jelly (Vaseline) or an antibiotic ointment if your doctor prescribed one for you, followed by a bandage.    For best healing, the wound should be covered with a layer of ointment at all times. If you are not able to keep the area covered with a bandage to hold the ointment in place, this may mean re-applying the ointment several times a day.  Continue this wound care until the wound has healed and is no longer open.   Itching and mild discomfort is normal during the healing process. However, if you develop pain or severe itching, please call our office.   If you have any discomfort, you can take Tylenol (acetaminophen) or ibuprofen as directed on the bottle. (Please do not take these if you have an allergy to them or cannot take them for another reason).  Some redness, tenderness and white or yellow material in the wound is normal healing.  If the area becomes very sore and red, or develops a thick yellow-green material (pus), it may be infected; please notify us.    If you have stitches, return to clinic as directed to have the stitches removed. You will continue wound care for 2-3 days after the stitches  are removed.   Wound healing continues for up to one year following surgery. It is not unusual to experience pain in the scar from time to time during the interval.  If the pain becomes severe or the scar thickens, you should notify the office.    A slight amount of redness in a scar is expected for the first six months.  After six months, the redness will fade and the scar will soften and fade.  The color difference becomes less noticeable with time.  If there are any problems, return for a post-op surgery check at your earliest convenience.  To improve the appearance of the scar, you can use silicone scar gel, cream, or sheets (such as Mederma or Serica) every night for up to one year. These are available over the counter (without a prescription).  Please call our office at [phone] for any questions or concerns.    Actinic keratoses are precancerous spots that appear secondary to cumulative UV radiation exposure/sun exposure over time. They are chronic with expected duration over 1 year. A portion of actinic keratoses will progress to squamous cell carcinoma of the skin. It is not possible to reliably predict which spots will progress to skin cancer and so treatment is recommended to prevent development of skin cancer.  Recommend daily broad spectrum sunscreen SPF 30+ to sun-exposed areas, reapply every 2 hours as needed.  Recommend staying in the shade or wearing long sleeves, sun glasses (UVA+UVB protection) and wide brim hats (4-inch brim around the entire circumference of the hat). Call for new or changing lesions.   Cryotherapy Aftercare  Wash gently with soap and water everyday.   Apply Vaseline and Band-Aid daily until healed.     Seborrheic Keratosis  What causes seborrheic keratoses? Seborrheic keratoses are harmless, common skin growths that first appear during adult life.  As time goes by, more growths appear.  Some people may develop a large number of them.  Seborrheic  keratoses appear on both covered and uncovered body parts.  They are not caused by sunlight.  The tendency to develop seborrheic keratoses can be inherited.  They vary in color from skin-colored to gray, brown, or even black.  They can be either smooth or have a rough, warty surface.   Seborrheic keratoses are superficial and look as if they were stuck on the skin.  Under the microscope this type of keratosis looks like layers upon layers of skin.  That is why at times the top layer may seem to fall off, but the rest of the growth remains and re-grows.    Treatment Seborrheic keratoses do not need to be treated, but can easily be removed in the office.  Seborrheic keratoses often cause symptoms when they rub on clothing or jewelry.  Lesions can be in the way of shaving.  If they become inflamed, they can cause itching, soreness, or burning.  Removal of a seborrheic keratosis can be accomplished by freezing, burning, or surgery. If any spot bleeds, scabs, or grows rapidly, please return to have it checked, as these can be an indication of a skin cancer.    Due to recent changes in healthcare laws, you may see results of your pathology and/or laboratory studies on MyChart before the doctors have had a chance to review them. We understand that in some cases there may be results that are confusing or concerning to you. Please understand that not all results are received at the same time and often the doctors may need to interpret multiple results in order to provide you with the best plan of care or course of treatment. Therefore, we ask that you please give [place] 2 business days to thoroughly review all your results before contacting the office for clarification. Should we see a critical lab result, you will be contacted sooner.   If You Need Anything After Your Visit  If you have any questions or concerns for your doctor, please call our main line at [phone] and press option 4 to reach your doctor's  medical assistant. If no one answers, please leave a voicemail as directed and we will return your call as soon as possible. Messages left after 4 pm will be answered the following business day.   You may also send [place] a message via Scraper. We typically respond to MyChart messages within 1-2 business days.  For prescription refills, please ask your pharmacy to contact our office. Our fax number is [phone].  If you have an urgent issue when the clinic is closed that cannot wait until the next business day, you can page your doctor at the number below.    Please note that while we do our best to be available for urgent issues outside of office hours, we are not available 24/7.   If you have an urgent issue and are unable to reach [place], you may choose to seek medical care  at your doctor's office, retail clinic, urgent care center, or emergency room.  If you have a medical emergency, please immediately call 911 or go to the emergency department.  Pager Numbers  - Dr. Nehemiah Massed: [phone]  - Dr. Laurence Ferrari: [phone]  - Dr. Nicole Kindred: [phone]  In the event of inclement weather, please call our main line at [phone] for an update on the status of any delays or closures.  Dermatology Medication Tips: Please keep the boxes that topical medications come in in order to help keep track of the instructions about where and how to use these. Pharmacies typically print the medication instructions only on the boxes and not directly on the medication tubes.   If your medication is too expensive, please contact our office at [phone] option 4 or send [place] a message through [place].   We are unable to tell what your co-pay for medications will be in advance as this is different depending on your insurance coverage. However, we may be able to find a substitute medication at lower cost or fill out paperwork to get insurance to cover a needed medication.   If a prior authorization is required to  get your medication covered by your insurance company, please allow [place] 1-2 business days to complete this process.  Drug prices often vary depending on where the prescription is filled and some pharmacies may offer cheaper prices.  The website www.goodrx.com contains coupons for medications through different pharmacies. The prices here do not account for what the cost may be with help from insurance (it may be cheaper with your insurance), but the website can give you the price if you did not use any insurance.  - You can print the associated coupon and take it with your prescription to the pharmacy.  - You may also stop by our office during regular business hours and pick up a GoodRx coupon card.  - If you need your prescription sent electronically to a different pharmacy, notify our office through [facility] or by phone at [phone] option 4.     Si Usted Necesita Algo Despus de Su Visita  Tambin puede enviarnos un mensaje a travs de Pharmacist, community. Por lo general respondemos a los mensajes de MyChart en el transcurso de 1 a 2 das hbiles.  Para renovar recetas, por favor pida a su farmacia que se ponga en contacto con nuestra oficina. Harland Dingwall de fax es [place] [phone].  Si tiene un asunto urgente cuando la clnica est cerrada y que no puede esperar hasta el siguiente da hbil, puede llamar/localizar a su doctor(a) al nmero que aparece a continuacin.   Por favor, tenga en cuenta que aunque hacemos todo lo posible para estar disponibles para asuntos urgentes fuera del horario de [place], no estamos disponibles las 24 horas del da, los 7 das de la [place].   Si tiene un problema urgente y no puede comunicarse con nosotros, puede optar por buscar atencin mdica  en el consultorio de su doctor(a), en una clnica privada, en un centro de atencin urgente o en una sala de emergencias.  Si tiene Engineering geologist, por favor llame inmediatamente al 911 o vaya a la sala de  emergencias.  Nmeros de bper  - Dr. Nehemiah Massed: [phone]  - Dra. Moye: [phone]  - Dra. Nicole Kindred: [phone]  En caso de inclemencias del [place], por favor llame a Johnsie Kindred principal al [phone] para una actualizacin sobre el [place] de cualquier retraso o cierre.  Consejos para la medicacin en  dermatologa: Por favor, guarde las cajas en las que vienen los medicamentos de uso tpico para ayudarle a seguir las instrucciones sobre dnde y cmo usarlos. Las farmacias generalmente imprimen las instrucciones del medicamento slo en las cajas y no directamente en los tubos del [place].   Si su medicamento es muy caro, por favor, pngase en contacto con Zigmund Daniel llamando al [phone] y presione la opcin 4 o envenos un mensaje a travs de Pharmacist, community.   No podemos decirle cul ser su copago por los medicamentos por adelantado ya que esto es diferente dependiendo de la cobertura de su seguro. Sin embargo, es posible que podamos encontrar un medicamento sustituto a Electrical engineer un formulario para que el seguro cubra el medicamento que se considera necesario.   Si se requiere una autorizacin previa para que su compaa de seguros [place] su medicamento, por favor permtanos de 1 a 2 das hbiles para completar este proceso.  Los precios de los medicamentos varan con frecuencia dependiendo del Environmental consultant de dnde se surte la receta y alguna farmacias pueden ofrecer precios ms baratos.  El sitio web www.goodrx.com tiene cupones para medicamentos de Airline pilot. Los precios aqu no tienen en cuenta lo que podra costar con la ayuda del seguro (puede ser ms barato con su seguro), pero el sitio web puede darle el precio si no utiliz Research scientist (physical sciences).  - Puede imprimir el cupn correspondiente y llevarlo con su receta a la farmacia.  - Tambin puede pasar por nuestra oficina durante el horario de atencin regular y Charity fundraiser una tarjeta de cupones de GoodRx.  - Si  necesita que su receta se enve electrnicamente a una farmacia diferente, informe a nuestra oficina a travs de MyChart de [place] o por telfono llamando al [phone] y presione la opcin 4.

## 2022-03-19 NOTE — Progress Notes (Signed)
Follow-Up Visit   Subjective  Renee Powell is a 88 y.o. female who presents for the following: Follow-up (Patient has several spots at face, right breast, back, and spot at vulva she would like checked today. ).  The patient has spots, moles and lesions to be evaluated, some may be new or changing and the patient has concerns that these could be cancer.  The following portions of the chart were reviewed this encounter and updated as appropriate:  Tobacco  Allergies  Meds  Problems  Med Hx  Surg Hx  Fam Hx      Objective  Well appearing patient in no apparent distress; mood and affect are within normal limits.  A focused examination was performed including face, chest, back, legs, vulva. Relevant physical exam findings are noted in the Assessment and Plan.  left forehead x 1 Erythematous thin papules/macules with gritty scale.   left forehead x 1 Erythematous stuck-on, waxy papule or plaque  right pretibia  0.9 cm pink papule   right labia minora 1.2 cm irregular brown patch    Assessment & Plan  Actinic keratosis left forehead x 1  Actinic keratoses are precancerous spots that appear secondary to cumulative UV radiation exposure/sun exposure over time. They are chronic with expected duration over 1 year. A portion of actinic keratoses will progress to squamous cell carcinoma of the skin. It is not possible to reliably predict which spots will progress to skin cancer and so treatment is recommended to prevent development of skin cancer.  Recommend daily broad spectrum sunscreen SPF 30+ to sun-exposed areas, reapply every 2 hours as needed.  Recommend staying in the shade or wearing long sleeves, sun glasses (UVA+UVB protection) and wide brim hats (4-inch brim around the entire circumference of the hat). Call for new or changing lesions.  Destruction of lesion - left forehead x 1  Destruction method: cryotherapy   Informed consent: discussed and consent obtained    Lesion destroyed using liquid nitrogen: Yes   Cryotherapy cycles:  2 Outcome: patient tolerated procedure well with no complications   Post-procedure details: wound care instructions given   Additional details:  Prior to procedure, discussed risks of blister formation, small wound, skin dyspigmentation, or rare scar following cryotherapy. Recommend Vaseline ointment to treated areas while healing.   Inflamed seborrheic keratosis left forehead x 1  Symptomatic, irritating, patient would like treated.  Destruction of lesion - left forehead x 1  Destruction method: cryotherapy   Informed consent: discussed and consent obtained   Lesion destroyed using liquid nitrogen: Yes   Cryotherapy cycles:  2 Outcome: patient tolerated procedure well with no complications   Post-procedure details: wound care instructions given   Additional details:  Prior to procedure, discussed risks of blister formation, small wound, skin dyspigmentation, or rare scar following cryotherapy. Recommend Vaseline ointment to treated areas while healing.   Neoplasm of uncertain behavior (2) right pretibia  Skin / nail biopsy Type of biopsy: tangential   Informed consent: discussed and consent obtained   Timeout: patient name, date of birth, surgical site, and procedure verified   Patient was prepped and draped in usual sterile fashion: Area prepped with isopropyl alcohol. Anesthesia: the lesion was anesthetized in a standard fashion   Anesthetic:  1% lidocaine w/ epinephrine 1-100,000 buffered w/ 8.4% NaHCO3 Instrument used: flexible razor blade   Hemostasis achieved with: aluminum chloride   Outcome: patient tolerated procedure well   Post-procedure details: wound care instructions given   Additional details:  Mupirocin and a bandage applied  Specimen 1 - Surgical pathology Differential Diagnosis: r/o SCC   Check Margins: No  right labia minora  Epidermal / dermal shaving  Lesion diameter (cm):   1.2 Informed consent: discussed and consent obtained   Timeout: patient name, date of birth, surgical site, and procedure verified   Patient was prepped and draped in usual sterile fashion: Area prepped with alcohol. Anesthesia: the lesion was anesthetized in a standard fashion   Anesthetic:  1% lidocaine w/ epinephrine 1-100,000 buffered w/ 8.4% NaHCO3 Instrument used: flexible razor blade   Hemostasis achieved with: pressure, aluminum chloride and electrodesiccation   Outcome: patient tolerated procedure well   Post-procedure details: wound care instructions given   Post-procedure details comment:  Ointment and small bandage applied.  Additional details:  Mupirocin and a bandage applied  Specimen 2 - Surgical pathology Differential Diagnosis: r/o atypia   Check Margins: No  R/o scc R pretibia  R/o atypia labia minora   Seborrheic Keratoses At right breast , back , face , forehead,  - Stuck-on, waxy, tan-brown papules and/or plaques  - Benign-appearing - Discussed benign etiology and prognosis. - Observe - Call for any changes  Melanocytic Nevi - Tan-brown and/or pink-flesh-colored symmetric macules and papules - Benign appearing on exam today - Observation - Call clinic for new or changing moles - Recommend daily use of broad spectrum spf 30+ sunscreen to sun-exposed areas.   Hemangiomas - Red papules - Discussed benign nature - Observe - Call for any changes  Actinic Damage - chronic, secondary to cumulative UV radiation exposure/sun exposure over time - diffuse scaly erythematous macules with underlying dyspigmentation - Recommend daily broad spectrum sunscreen SPF 30+ to sun-exposed areas, reapply every 2 hours as needed.  - Recommend staying in the shade or wearing long sleeves, sun glasses (UVA+UVB protection) and wide brim hats (4-inch brim around the entire circumference of the hat). - Call for new or changing lesions.  Return in about 6 months (around  09/19/2022) for TBSE.  I, Ruthell Rummage, CMA, am acting as scribe for Forest Gleason, MD.  Documentation: I have reviewed the above documentation for accuracy and completeness, and I agree with the above.  Forest Gleason, MD

## 2022-03-25 ENCOUNTER — Telehealth: Payer: Self-pay

## 2022-03-25 NOTE — Telephone Encounter (Signed)
-----   Message from Alfonso Patten, MD sent at 03/25/2022 10:30 AM EDT ----- 1. Skin , right pretibia BASAL CELL CARCINOMA, NODULAR PATTERN, BASE INVOLVED --> ED&C   2. Skin , right labia minora LABIAL MELANOTIC MACULE (LENTIGO)  --> benign freckle, no treatment needed  MAs please call to review and schedule for [facility]. Thank you!

## 2022-03-25 NOTE — Telephone Encounter (Signed)
Patient advised bx results, scheduled for [facility] to treat BCC at right pretibia. Lurlean Horns., RMA

## 2022-03-27 ENCOUNTER — Encounter: Payer: Self-pay | Admitting: Dermatology

## 2022-04-07 ENCOUNTER — Other Ambulatory Visit: Payer: Self-pay | Admitting: Family Medicine

## 2022-04-08 NOTE — Telephone Encounter (Signed)
Requested Prescriptions  Pending Prescriptions Disp Refills  . amLODipine (NORVASC) 5 MG tablet [Pharmacy Med Name: AMLODIPINE BESYLATE 5 MG TAB] 90 tablet 0    Sig: TAKE 1 TABLET (5 MG TOTAL) BY MOUTH DAILY.     Cardiovascular: Calcium Channel Blockers 2 Failed - 04/07/2022  9:30 AM      Failed - Last BP in normal range    BP Readings from Last 1 Encounters:  01/28/22 (!) 142/62         Passed - Last Heart Rate in normal range    Pulse Readings from Last 1 Encounters:  01/28/22 65         Passed - Valid encounter within last 6 months    Recent Outpatient Visits          2 months ago Primary hypertension   [facility] Jerrol Banana., MD   5 months ago Primary hypertension   [facility] Jerrol Banana., MD   8 months ago Hypothyroidism, unspecified type   [facility] Jerrol Banana., MD   1 year ago Annual physical exam   [facility] Jerrol Banana., MD   1 year ago Herpes zoster with ophthalmic complication, unspecified herpes zoster eye disease   [facility] Jerrol Banana., MD      Future Appointments            In 1 week [facility] [place], MD [place]   In 5 months Jerrol Banana., MD [facility], [place]

## 2022-04-15 ENCOUNTER — Encounter: Payer: Self-pay | Admitting: Dermatology

## 2022-04-15 ENCOUNTER — Ambulatory Visit (INDEPENDENT_AMBULATORY_CARE_PROVIDER_SITE_OTHER): Payer: Medicare Other | Admitting: Dermatology

## 2022-04-15 DIAGNOSIS — C44712 Basal cell carcinoma of skin of right lower limb, including hip: Secondary | ICD-10-CM | POA: Diagnosis not present

## 2022-04-15 DIAGNOSIS — L570 Actinic keratosis: Secondary | ICD-10-CM

## 2022-04-15 MED ORDER — MUPIROCIN 2 % EX OINT: 1.0000 | TOPICAL_OINTMENT | Freq: Every day | CUTANEOUS | 0 refills | Status: DC

## 2022-04-15 NOTE — Patient Instructions (Signed)
Wound Care Instructions  Cleanse wound gently with soap and water once a day then pat dry with clean gauze. Apply a thin coat of Petrolatum (petroleum jelly, "Vaseline") over the wound (unless you have an allergy to this). We recommend that you use a new, sterile tube of Vaseline. Do not pick or remove scabs. Do not remove the yellow or white "healing tissue" from the base of the wound.  Cover the wound with fresh, clean, nonstick gauze and secure with paper tape. You may use Band-Aids in place of gauze and tape if the wound is small enough, but would recommend trimming much of the tape off as there is often too much. Sometimes Band-Aids can irritate the skin.  You should call the office for your biopsy report after 1 week if you have not already been contacted.  If you experience any problems, such as abnormal amounts of bleeding, swelling, significant bruising, significant pain, or evidence of infection, please call the office immediately.  FOR ADULT SURGERY PATIENTS: If you need something for pain relief you may take 1 extra strength Tylenol (acetaminophen) AND 2 Ibuprofen (200mg each) together every 4 hours as needed for pain. (do not take these if you are allergic to them or if you have a reason you should not take them.) Typically, you may only need pain medication for 1 to 3 days.     Due to recent changes in healthcare laws, you may see results of your pathology and/or laboratory studies on MyChart before the doctors have had a chance to review them. We understand that in some cases there may be results that are confusing or concerning to you. Please understand that not all results are received at the same time and often the doctors may need to interpret multiple results in order to provide you with the best plan of care or course of treatment. Therefore, we ask that you please give us 2 business days to thoroughly review all your results before contacting the office for clarification. Should  we see a critical lab result, you will be contacted sooner.   If You Need Anything After Your Visit  If you have any questions or concerns for your doctor, please call our main line at [phone] and press option 4 to reach your doctor's medical assistant. If no one answers, please leave a voicemail as directed and we will return your call as soon as possible. Messages left after 4 pm will be answered the following business day.   You may also send us a message via MyChart. We typically respond to MyChart messages within 1-2 business days.  For prescription refills, please ask your pharmacy to contact our office. Our fax number is [phone].  If you have an urgent issue when the clinic is closed that cannot wait until the next business day, you can page your doctor at the number below.    Please note that while we do our best to be available for urgent issues outside of office hours, we are not available 24/7.   If you have an urgent issue and are unable to reach us, you may choose to seek medical care at your doctor's office, retail clinic, urgent care center, or emergency room.  If you have a medical emergency, please immediately call 911 or go to the emergency department.  Pager Numbers  - Dr. Kowalski: [phone]  - Dr. Moye: [phone]  - Dr. Stewart: [phone]  In the event of inclement weather, please call our main line at   [phone] for an update on the status of any delays or closures.  Dermatology Medication Tips: Please keep the boxes that topical medications come in in order to help keep track of the instructions about where and how to use these. Pharmacies typically print the medication instructions only on the boxes and not directly on the medication tubes.   If your medication is too expensive, please contact our office at [phone] option 4 or send us a message through MyChart.   We are unable to tell what your co-pay for medications will be in  advance as this is different depending on your insurance coverage. However, we may be able to find a substitute medication at lower cost or fill out paperwork to get insurance to cover a needed medication.   If a prior authorization is required to get your medication covered by your insurance company, please allow us 1-2 business days to complete this process.  Drug prices often vary depending on where the prescription is filled and some pharmacies may offer cheaper prices.  The website www.goodrx.com contains coupons for medications through different pharmacies. The prices here do not account for what the cost may be with help from insurance (it may be cheaper with your insurance), but the website can give you the price if you did not use any insurance.  - You can print the associated coupon and take it with your prescription to the pharmacy.  - You may also stop by our office during regular business hours and pick up a GoodRx coupon card.  - If you need your prescription sent electronically to a different pharmacy, notify our office through [facility] MyChart or by phone at [phone] option 4.     Si Usted Necesita Algo Despus de Su Visita  Tambin puede enviarnos un mensaje a travs de MyChart. Por lo general respondemos a los mensajes de MyChart en el transcurso de 1 a 2 das hbiles.  Para renovar recetas, por favor pida a su farmacia que se ponga en contacto con nuestra oficina. Nuestro nmero de fax es el [phone].  Si tiene un asunto urgente cuando la clnica est cerrada y que no puede esperar hasta el siguiente da hbil, puede llamar/localizar a su doctor(a) al nmero que aparece a continuacin.   Por favor, tenga en cuenta que aunque hacemos todo lo posible para estar disponibles para asuntos urgentes fuera del horario de oficina, no estamos disponibles las 24 horas del da, los 7 das de la semana.   Si tiene un problema urgente y no puede comunicarse con nosotros, puede  optar por buscar atencin mdica  en el consultorio de su doctor(a), en una clnica privada, en un centro de atencin urgente o en una sala de emergencias.  Si tiene una emergencia mdica, por favor llame inmediatamente al 911 o vaya a la sala de emergencias.  Nmeros de bper  - Dr. Kowalski: [phone]  - Dra. Moye: [phone]  - Dra. Stewart: [phone]  En caso de inclemencias del tiempo, por favor llame a nuestra lnea principal al [phone] para una actualizacin sobre el estado de cualquier retraso o cierre.  Consejos para la medicacin en dermatologa: Por favor, guarde las cajas en las que vienen los medicamentos de uso tpico para ayudarle a seguir las instrucciones sobre dnde y cmo usarlos. Las farmacias generalmente imprimen las instrucciones del medicamento slo en las cajas y no directamente en los tubos del medicamento.   Si su medicamento es muy caro, por favor, pngase en contacto con   nuestra oficina llamando al [phone] y presione la opcin 4 o envenos un mensaje a travs de MyChart.   No podemos decirle cul ser su copago por los medicamentos por adelantado ya que esto es diferente dependiendo de la cobertura de su seguro. Sin embargo, es posible que podamos encontrar un medicamento sustituto a menor costo o llenar un formulario para que el seguro cubra el medicamento que se considera necesario.   Si se requiere una autorizacin previa para que su compaa de seguros cubra su medicamento, por favor permtanos de 1 a 2 das hbiles para completar este proceso.  Los precios de los medicamentos varan con frecuencia dependiendo del lugar de dnde se surte la receta y alguna farmacias pueden ofrecer precios ms baratos.  El sitio web www.goodrx.com tiene cupones para medicamentos de diferentes farmacias. Los precios aqu no tienen en cuenta lo que podra costar con la ayuda del seguro (puede ser ms barato con su seguro), pero el sitio web puede darle el  precio si no utiliz ningn seguro.  - Puede imprimir el cupn correspondiente y llevarlo con su receta a la farmacia.  - Tambin puede pasar por nuestra oficina durante el horario de atencin regular y recoger una tarjeta de cupones de GoodRx.  - Si necesita que su receta se enve electrnicamente a una farmacia diferente, informe a nuestra oficina a travs de MyChart de [place] o por telfono llamando al [phone] y presione la opcin 4.  

## 2022-04-15 NOTE — Progress Notes (Signed)
   Follow-Up Visit   Subjective  Renee Powell is a 88 y.o. female who presents for the following: Procedure (Patient here today to treat bx proven BCC at right pretibia. ).  The following portions of the chart were reviewed this encounter and updated as appropriate:   Tobacco  Allergies  Meds  Problems  Med Hx  Surg Hx  Fam Hx      Review of Systems:  No other skin or systemic complaints except as noted in HPI or Assessment and Plan.  Objective  Well appearing patient in no apparent distress; mood and affect are within normal limits.  A focused examination was performed including right leg. Relevant physical exam findings are noted in the Assessment and Plan.  right pretibia Healing bx site     right pretibia Erythematous thin papules/macules with gritty scale.     Assessment & Plan  Basal cell carcinoma (BCC) of skin of right lower extremity including hip right pretibia  Destruction of lesion  Destruction method: electrodesiccation and curettage   Informed consent: discussed and consent obtained   Timeout:  patient name, date of birth, surgical site, and procedure verified Anesthesia: the lesion was anesthetized in a standard fashion   Anesthetic:  1% lidocaine w/ epinephrine 1-100,000 local infiltration Curettage performed in three different directions: Yes   Electrodesiccation performed over the curetted area: Yes   Curettage cycles:  3 Final wound size (cm):  1.8 Hemostasis achieved with:  electrodesiccation Outcome: patient tolerated procedure well with no complications   Post-procedure details: sterile dressing applied and wound care instructions given   Dressing type: petrolatum    mupirocin ointment (BACTROBAN) 2 % Apply 1 Application topically daily.  Topical anesthetic applied 20 minutes prior to EDC  AK (actinic keratosis) right pretibia  Actinic keratoses are precancerous spots that appear secondary to cumulative UV radiation exposure/sun  exposure over time. They are chronic with expected duration over 1 year. A portion of actinic keratoses will progress to squamous cell carcinoma of the skin. It is not possible to reliably predict which spots will progress to skin cancer and so treatment is recommended to prevent development of skin cancer.  Recommend daily broad spectrum sunscreen SPF 30+ to sun-exposed areas, reapply every 2 hours as needed.  Recommend staying in the shade or wearing long sleeves, sun glasses (UVA+UVB protection) and wide brim hats (4-inch brim around the entire circumference of the hat). Call for new or changing lesions.  Prior to procedure, discussed risks of blister formation, small wound, skin dyspigmentation, or rare scar following cryotherapy. Recommend Vaseline ointment to treated areas while healing.   Destruction of lesion - right pretibia  Destruction method: cryotherapy   Informed consent: discussed and consent obtained   Lesion destroyed using liquid nitrogen: Yes   Cryotherapy cycles:  2 Outcome: patient tolerated procedure well with no complications   Post-procedure details: wound care instructions given     Return for TBSE, as scheduled.  Graciella Belton, RMA, am acting as scribe for Forest Gleason, MD .  Documentation: I have reviewed the above documentation for accuracy and completeness, and I agree with the above.  Forest Gleason, MD

## 2022-04-24 ENCOUNTER — Encounter: Payer: Self-pay | Admitting: Dermatology

## 2022-04-28 ENCOUNTER — Ambulatory Visit (INDEPENDENT_AMBULATORY_CARE_PROVIDER_SITE_OTHER): Payer: Medicare Other | Admitting: Dermatology

## 2022-04-28 DIAGNOSIS — L03115 Cellulitis of right lower limb: Secondary | ICD-10-CM

## 2022-04-28 MED ORDER — DOXYCYCLINE MONOHYDRATE 100 MG PO CAPS: 100.0000 mg | ORAL_CAPSULE | Freq: Two times a day (BID) | ORAL | 0 refills | Status: DC

## 2022-04-28 NOTE — Progress Notes (Signed)
   Follow-Up Visit   Subjective  Renee Powell is a 88 y.o. female who presents for the following: check leg (R pretibia, EDC on 04/15/22 and pt states leg started swelling and turning red and is burning ~5 days ago, pt has been using mupirocin ointment qd).    The following portions of the chart were reviewed this encounter and updated as appropriate:       Review of Systems:  No other skin or systemic complaints except as noted in HPI or Assessment and Plan.  Objective  Well appearing patient in no apparent distress; mood and affect are within normal limits.  A focused examination was performed including right lower leg. Relevant physical exam findings are noted in the Assessment and Plan.  R pretibia Crusted ulceration tender to touch with surrounding erythema R pretibia, pitting edema of the ankle        Assessment & Plan  Cellulitis of right lower extremity R pretibia  Bacterial culture today of EDC site Start Doxycycline '100mg'$  1 po bid with food and drink for 10 days Cont wound care increasing to bid, wash with soap and water then apply Mupirocin oint and cover Recommend compression socks qd  Wound cleansed today with Puracyn, followed by Mupirocin ointment, telfa and coban  doxycycline (MONODOX) 100 MG capsule - R pretibia Take 1 capsule (100 mg total) by mouth 2 (two) times daily for 10 days. Take with food and drink  Aerobic culture - R pretibia   Return in about 2 weeks (around 05/12/2022) for Cellulitis f/u.  I, Othelia Pulling, RMA, am acting as scribe for Brendolyn Patty, MD .  Documentation: I have reviewed the above documentation for accuracy and completeness, and I agree with the above.  Brendolyn Patty MD

## 2022-04-28 NOTE — Patient Instructions (Addendum)
Start Doxycycline '100mg'$  1 pill twice a day with food and drink for 10 days Continue wound care increasing to 2 times a day, wash with soap and warter then apply Mupirocin ointment Recommend compression socks daily Coban wrap for leg  Doxycycline should be taken with food to prevent nausea. Do not lay down for 30 minutes after taking. Be cautious with sun exposure and use good sun protection while on this medication. Pregnant women should not take this medication.        Due to recent changes in healthcare laws, you may see results of your pathology and/or laboratory studies on MyChart before the doctors have had a chance to review them. We understand that in some cases there may be results that are confusing or concerning to you. Please understand that not all results are received at the same time and often the doctors may need to interpret multiple results in order to provide you with the best plan of care or course of treatment. Therefore, we ask that you please give [place] 2 business days to thoroughly review all your results before contacting the office for clarification. Should we see a critical lab result, you will be contacted sooner.   If You Need Anything After Your Visit  If you have any questions or concerns for your doctor, please call our main line at [phone] and press option 4 to reach your doctor's medical assistant. If no one answers, please leave a voicemail as directed and we will return your call as soon as possible. Messages left after 4 pm will be answered the following business day.   You may also send [place] a message via [place]. We typically respond to MyChart messages within 1-2 business days.  For prescription refills, please ask your pharmacy to contact our office. Our fax number is [phone].  If you have an urgent issue when the clinic is closed that cannot wait until the next business day, you can page your doctor at the number below.    Please note that while  we do our best to be available for urgent issues outside of office hours, we are not available 24/7.   If you have an urgent issue and are unable to reach [place], you may choose to seek medical care at your doctor's office, retail clinic, urgent care center, or emergency room.  If you have a medical emergency, please immediately call 911 or go to the emergency department.  Pager Numbers  - Dr. Nehemiah Massed: [phone]  - Dr. Laurence Ferrari: [phone]  - Dr. Nicole Kindred: [phone]  In the event of inclement weather, please call our main line at [phone] for an update on the status of any delays or closures.  Dermatology Medication Tips: Please keep the boxes that topical medications come in in order to help keep track of the instructions about where and how to use these. Pharmacies typically print the medication instructions only on the boxes and not directly on the medication tubes.   If your medication is too expensive, please contact our office at [phone] option 4 or send [place] a message through [facility].   We are unable to tell what your co-pay for medications will be in advance as this is different depending on your insurance coverage. However, we may be able to find a substitute medication at lower cost or fill out paperwork to get insurance to cover a needed medication.   If a prior authorization is required to get your medication covered by your insurance company, please allow  [place] 1-2 business days to complete this process.  Drug prices often vary depending on where the prescription is filled and some pharmacies may offer cheaper prices.  The website www.goodrx.com contains coupons for medications through different pharmacies. The prices here do not account for what the cost may be with help from insurance (it may be cheaper with your insurance), but the website can give you the price if you did not use any insurance.  - You can print the associated coupon and take it with your prescription  to the pharmacy.  - You may also stop by our office during regular business hours and pick up a GoodRx coupon card.  - If you need your prescription sent electronically to a different pharmacy, notify our office through [facility] or by phone at [phone] option 4.     Si Usted Necesita Algo Despus de Su Visita  Tambin puede enviarnos un mensaje a travs de Pharmacist, community. Por lo general respondemos a los mensajes de MyChart en el transcurso de 1 a 2 das hbiles.  Para renovar recetas, por favor pida a su farmacia que se ponga en contacto con nuestra oficina. Harland Dingwall de fax es [place] [phone].  Si tiene un asunto urgente cuando la clnica est cerrada y que no puede esperar hasta el siguiente da hbil, puede llamar/localizar a su doctor(a) al nmero que aparece a continuacin.   Por favor, tenga en cuenta que aunque hacemos todo lo posible para estar disponibles para asuntos urgentes fuera del horario de [place], no estamos disponibles las 24 horas del da, los 7 das de la [place].   Si tiene un problema urgente y no puede comunicarse con nosotros, puede optar por buscar atencin mdica  en el consultorio de su doctor(a), en una clnica privada, en un centro de atencin urgente o en una sala de emergencias.  Si tiene Engineering geologist, por favor llame inmediatamente al 911 o vaya a la sala de emergencias.  Nmeros de bper  - Dr. Nehemiah Massed: [phone]  - Dra. Moye: [phone]  - Dra. Nicole Kindred: [phone]  En caso de inclemencias del [place], por favor llame a Johnsie Kindred principal al [phone] para una actualizacin sobre el [place] de cualquier retraso o cierre.  Consejos para la medicacin en dermatologa: Por favor, guarde las cajas en las que vienen los medicamentos de uso tpico para ayudarle a seguir las instrucciones sobre dnde y cmo usarlos. Las farmacias generalmente imprimen las instrucciones del medicamento slo en las cajas y no directamente en  los tubos del [place].   Si su medicamento es muy caro, por favor, pngase en contacto con Zigmund Daniel llamando al [phone] y presione la opcin 4 o envenos un mensaje a travs de Pharmacist, community.   No podemos decirle cul ser su copago por los medicamentos por adelantado ya que esto es diferente dependiendo de la cobertura de su seguro. Sin embargo, es posible que podamos encontrar un medicamento sustituto a Electrical engineer un formulario para que el seguro cubra el medicamento que se considera necesario.   Si se requiere una autorizacin previa para que su compaa de seguros [place] su medicamento, por favor permtanos de 1 a 2 das hbiles para completar este proceso.  Los precios de los medicamentos varan con frecuencia dependiendo del Environmental consultant de dnde se surte la receta y alguna farmacias pueden ofrecer precios ms baratos.  El sitio web www.goodrx.com tiene cupones para medicamentos de Airline pilot. Los precios aqu no tienen en cuenta lo que podra  lo que podra costar con la ayuda del seguro (puede ser ms barato con su seguro), pero el sitio web puede darle el precio si no utiliz ningn seguro.  - Puede imprimir el cupn correspondiente y llevarlo con su receta a la farmacia.  - Tambin puede pasar por nuestra oficina durante el horario de atencin regular y recoger una tarjeta de cupones de GoodRx.  - Si necesita que su receta se enve electrnicamente a una farmacia diferente, informe a nuestra oficina a travs de MyChart de [place] o por telfono llamando al [phone] y presione la opcin 4.  

## 2022-04-29 ENCOUNTER — Ambulatory Visit (INDEPENDENT_AMBULATORY_CARE_PROVIDER_SITE_OTHER): Payer: Medicare Other | Admitting: Obstetrics & Gynecology

## 2022-04-29 ENCOUNTER — Encounter: Payer: Self-pay | Admitting: Obstetrics & Gynecology

## 2022-04-29 VITALS — BP 122/70 | Ht 64.0 in

## 2022-04-29 DIAGNOSIS — N814 Uterovaginal prolapse, unspecified: Secondary | ICD-10-CM

## 2022-04-29 DIAGNOSIS — Z4689 Encounter for fitting and adjustment of other specified devices: Secondary | ICD-10-CM

## 2022-04-29 DIAGNOSIS — N8110 Cystocele, unspecified: Secondary | ICD-10-CM

## 2022-04-29 NOTE — Progress Notes (Signed)
   Subjective:    Patient ID: Renee Powell, female    DOB: [birth year], 88 y.o.   MRN: [id number]  HPI 88 yo widowed P2 is here for pessary maintenance. She reports that her current pessary is no longer supporting her bladder. She feels it falling out all the time. She denies constipation but does have some issues with defecation due to the bladder still falling out.   Review of Systems She lives alone with her son nearby. She still drives herself in a limited fashion. She is dealing with her leg healing after a removal of a basal cell carcinoma from her right tibia. The lesion biopsied from her vulva this year was benign. Her husband died about 4 years ago and she is abstinent.    Objective:   Physical Exam Well nourished, well hydrated White female, no apparent distress She is ambulating and conversing normally. I examined her in the supine position with the pessary in initially. The bladder was past the introitus, past the pessary.  I removed the pessary and inspected the vagina with a speculum. There were no excoriations or other issues, other than prolapse.  I did note a urethral caruncle.  I did a trial fitting with a #4 ring with support and this did not cause discomfort and supported her bladder.  I cleaned and replaced her #3 ring while a #4 is ordered.     Assessment & Plan:  Uterine prolapse with grade 4 cystocele  She will return in 2 weeks to get the #4 pessary inserted.

## 2022-05-01 LAB — AEROBIC CULTURE

## 2022-05-05 ENCOUNTER — Telehealth: Payer: Self-pay

## 2022-05-05 DIAGNOSIS — L03115 Cellulitis of right lower limb: Secondary | ICD-10-CM

## 2022-05-05 MED ORDER — DOXYCYCLINE MONOHYDRATE 100 MG PO CAPS: 100.0000 mg | ORAL_CAPSULE | Freq: Two times a day (BID) | ORAL | 0 refills | Status: AC

## 2022-05-05 NOTE — Telephone Encounter (Signed)
-----   Message from Brendolyn Patty, MD sent at 05/05/2022 12:50 PM EDT ----- Bacterial culture is negative but recommend she continue Doxycycline as prescribed until gone- please call patient

## 2022-05-05 NOTE — Telephone Encounter (Signed)
Advised patient bacterial culture was negative, but recommend finishing doxycycline as prescribed. Patient requests to have additional doxycycline sent in since she only has 2 days left. She states that her leg is improving, but still red. [place] is leaving for the beach on Friday and would prefer to continue treatment a few days longer. Sent 1Rf of doxycycline (additional 10 days) to [facility]. If patient is improving after 14 days, she may d/c.

## 2022-05-06 ENCOUNTER — Ambulatory Visit (INDEPENDENT_AMBULATORY_CARE_PROVIDER_SITE_OTHER): Payer: Medicare Other | Admitting: Dermatology

## 2022-05-06 DIAGNOSIS — L97819 Non-pressure chronic ulcer of other part of right lower leg with unspecified severity: Secondary | ICD-10-CM | POA: Diagnosis not present

## 2022-05-06 DIAGNOSIS — L97919 Non-pressure chronic ulcer of unspecified part of right lower leg with unspecified severity: Secondary | ICD-10-CM

## 2022-05-06 NOTE — Progress Notes (Signed)
   Follow-Up Visit   Subjective  Renee Powell is a 88 y.o. female who presents for the following: Follow-up.  Patient came in office today to have ulceration of the right pretibia rechecked.  She states that it is very sore and her foot has been swollen. She has been using mupirocin ointment, although it stings when applied, and covering with bandage. She also wears compression daily. This area was a previous [place] site treated with EDC on 04/15/22. Culture performed 04/28/2022 and was negative, but patient was advised to continue doxycycline '100mg'$  twice a day as prescribed. Patient requested to continue doxycycline for a few more days yesterday by phone and refill sent in.   The following portions of the chart were reviewed this encounter and updated as appropriate:       Review of Systems:  No other skin or systemic complaints except as noted in HPI or Assessment and Plan.  Objective  Well appearing patient in no apparent distress; mood and affect are within normal limits.  A focused examination was performed including right lower leg. Relevant physical exam findings are noted in the Assessment and Plan.  right pretibia Crusted ulceration 2.0 x 1.5 cm, very tender to touch.  Mild surrounding erythema             Assessment & Plan  Ulcer of right lower extremity, unspecified ulcer stage [facility]) right pretibia  Site of previous EDC from 04/15/2022. Bacterial culture 04/28/2022 was negative.   Wound cleansed with Puracyn, topical lidocaine applied, followed by sharp debridement with forceps and gradle scissor. Mupirocin 2% ointment applied followed by telfa and coban wrap. Photos taken before and after debridement.  Patient to continue wound care. Cleanse daily with soap and water followed by mupirocin 2% ointment, non-stick pad, and wrap. Continue compression during the day. Keep feet elevated when sitting. Refill of Doxycycine sent in 100 mg PO bid x 10 days.  Consider starting  Duoderm on f/up if no evidence of infection.  Continue doxycycline '100mg'$  1 po BID with food. Patient will pick up refill for a total of 20 days treatment.      Return in about 1 week (around 05/13/2022), or as scheduled with Dr Laurence Ferrari.  IJamesetta Orleans, CMA, am acting as scribe for Brendolyn Patty, MD .  Documentation: I have reviewed the above documentation for accuracy and completeness, and I agree with the above.  Brendolyn Patty MD

## 2022-05-06 NOTE — Patient Instructions (Signed)
Continue wound care. Cleanse ulcer on right lower leg daily with soap and water followed by mupirocin 2% ointment, non-stick pad, and wrap. Continue compression during the day. Keep feet elevated when sitting.   Continue doxycycline '100mg'$  1 capsule twice daily until finished (total 20 days). Doxycycline should be taken with food to prevent nausea. Do not lay down for 30 minutes after taking. Be cautious with sun exposure and use good sun protection while on this medication. Pregnant women should not take this medication.   Due to recent changes in healthcare laws, you may see results of your pathology and/or laboratory studies on MyChart before the doctors have had a chance to review them. We understand that in some cases there may be results that are confusing or concerning to you. Please understand that not all results are received at the same time and often the doctors may need to interpret multiple results in order to provide you with the best plan of care or course of treatment. Therefore, we ask that you please give Renee Powell 2 business days to thoroughly review all your results before contacting the office for clarification. Should we see a critical lab result, you will be contacted sooner.   If You Need Anything After Your Visit  If you have any questions or concerns for your doctor, please call our main line at [phone] and press option 4 to reach your doctor's medical assistant. If no one answers, please leave a voicemail as directed and we will return your call as soon as possible. Messages left after 4 pm will be answered the following business day.   You may also send Renee Powell a message via [place]. We typically respond to MyChart messages within 1-2 business days.  For prescription refills, please ask your pharmacy to contact our office. Our fax number is [phone].  If you have an urgent issue when the clinic is closed that cannot wait until the next business day, you can page your doctor at  the number below.    Please note that while we do our best to be available for urgent issues outside of office hours, we are not available 24/7.   If you have an urgent issue and are unable to reach Renee Powell, you may choose to seek medical care at your doctor's office, retail clinic, urgent care center, or emergency room.  If you have a medical emergency, please immediately call 911 or go to the emergency department.  Pager Numbers  - Dr. Nehemiah Massed: [phone]  - Dr. Laurence Ferrari: [phone]  - Dr. Nicole Kindred: [phone]  In the event of inclement weather, please call our main line at [phone] for an update on the status of any delays or closures.  Dermatology Medication Tips: Please keep the boxes that topical medications come in in order to help keep track of the instructions about where and how to use these. Pharmacies typically print the medication instructions only on the boxes and not directly on the medication tubes.   If your medication is too expensive, please contact our office at [phone] option 4 or send Renee Powell a message through [place].   We are unable to tell what your co-pay for medications will be in advance as this is different depending on your insurance coverage. However, we may be able to find a substitute medication at lower cost or fill out paperwork to get insurance to cover a needed medication.   If a prior authorization is required to get your medication covered by your insurance company, please allow  Renee Powell 1-2 business days to complete this process.  Drug prices often vary depending on where the prescription is filled and some pharmacies may offer cheaper prices.  The website www.goodrx.com contains coupons for medications through different pharmacies. The prices here do not account for what the cost may be with help from insurance (it may be cheaper with your insurance), but the website can give you the price if you did not use any insurance.  - You can print the  associated coupon and take it with your prescription to the pharmacy.  - You may also stop by our office during regular business hours and pick up a GoodRx coupon card.  - If you need your prescription sent electronically to a different pharmacy, notify our office through [facility] or by phone at [phone] option 4.     Si Usted Necesita Algo Despus de Su Visita  Tambin puede enviarnos un mensaje a travs de Pharmacist, community. Por lo general respondemos a los mensajes de MyChart en el transcurso de 1 a 2 das hbiles.  Para renovar recetas, por favor pida a su farmacia que se ponga en contacto con nuestra oficina. Harland Dingwall de fax es [place] [phone].  Si tiene un asunto urgente cuando la clnica est cerrada y que no puede esperar hasta el siguiente da hbil, puede llamar/localizar a su doctor(a) al nmero que aparece a continuacin.   Por favor, tenga en cuenta que aunque hacemos todo lo posible para estar disponibles para asuntos urgentes fuera del horario de [place], no estamos disponibles las 24 horas del da, los 7 das de la [place].   Si tiene un problema urgente y no puede comunicarse con nosotros, puede optar por buscar atencin mdica  en el consultorio de su doctor(a), en una clnica privada, en un centro de atencin urgente o en una sala de emergencias.  Si tiene Engineering geologist, por favor llame inmediatamente al 911 o vaya a la sala de emergencias.  Nmeros de bper  - Dr. Nehemiah Massed: [phone]  - Dra. Moye: [phone]  - Dra. Nicole Kindred: [phone]  En caso de inclemencias del [place], por favor llame a Johnsie Kindred principal al [phone] para una actualizacin sobre el [place] de cualquier retraso o cierre.  Consejos para la medicacin en dermatologa: Por favor, guarde las cajas en las que vienen los medicamentos de uso tpico para ayudarle a seguir las instrucciones sobre dnde y cmo usarlos. Las farmacias generalmente imprimen las instrucciones  del medicamento slo en las cajas y no directamente en los tubos del [place].   Si su medicamento es muy caro, por favor, pngase en contacto con Zigmund Daniel llamando al [phone] y presione la opcin 4 o envenos un mensaje a travs de Pharmacist, community.   No podemos decirle cul ser su copago por los medicamentos por adelantado ya que esto es diferente dependiendo de la cobertura de su seguro. Sin embargo, es posible que podamos encontrar un medicamento sustituto a Electrical engineer un formulario para que el seguro cubra el medicamento que se considera necesario.   Si se requiere una autorizacin previa para que su compaa de seguros [place] su medicamento, por favor permtanos de 1 a 2 das hbiles para completar este proceso.  Los precios de los medicamentos varan con frecuencia dependiendo del Environmental consultant de dnde se surte la receta y alguna farmacias pueden ofrecer precios ms baratos.  El sitio web www.goodrx.com tiene cupones para medicamentos de Airline pilot. Los precios aqu no tienen en cuenta lo que podra  costar con la ayuda del seguro (puede ser ms barato con su seguro), pero el sitio web puede darle el precio si no Field seismologist.  - Puede imprimir el cupn correspondiente y llevarlo con su receta a la farmacia.  - Tambin puede pasar por nuestra oficina durante el horario de atencin regular y Charity fundraiser una tarjeta de cupones de GoodRx.  - Si necesita que su receta se enve electrnicamente a una farmacia diferente, informe a nuestra oficina a travs de MyChart de [place] o por telfono llamando al [phone] y presione la opcin 4.

## 2022-05-13 ENCOUNTER — Ambulatory Visit (INDEPENDENT_AMBULATORY_CARE_PROVIDER_SITE_OTHER): Payer: Medicare Other | Admitting: Dermatology

## 2022-05-13 DIAGNOSIS — L97911 Non-pressure chronic ulcer of unspecified part of right lower leg limited to breakdown of skin: Secondary | ICD-10-CM

## 2022-05-13 NOTE — Progress Notes (Signed)
   Follow-Up Visit   Subjective  Renee Powell is a 88 y.o. female who presents for the following: Follow-up (1 week f/u on the right pretibial,this area was a previous [place] site treated with EDC on 04/15/22. Culture performed 04/28/2022 and was negative,patient taking doxycycline '100mg'$  twice x 10 days and using Mupirocin ointment, ).   The following portions of the chart were reviewed this encounter and updated as appropriate:   Tobacco  Allergies  Meds  Problems  Med Hx  Surg Hx  Fam Hx      Review of Systems:  No other skin or systemic complaints except as noted in HPI or Assessment and Plan.  Objective  Well appearing patient in no apparent distress; mood and affect are within normal limits.  A focused examination was performed including right leg. Relevant physical exam findings are noted in the Assessment and Plan.  right pretibial Crusted ulceration 2.0 x 1.5 cm, very tender to touch.  Mild surrounding erythema    Assessment & Plan  Ulcer of right leg, limited to breakdown of skin (HCC) right pretibial  S/p ED&C  Wound cleansed with Puracyn, applied Mupirocin 2% ointment  followed by telfa and coban wrap.  Start otc blister band aid, change blister band aid every 3 days.    Continue doxycycline '100mg'$  1 po BID with food x 5 days  Start mupirocin daily.  May consider Unna boot if no better at the next office visit     Return in about 2 weeks (around 05/27/2022) for recheck ulcer.  I, Marye Round, CMA, am acting as scribe for Forest Gleason, MD .   Documentation: I have reviewed the above documentation for accuracy and completeness, and I agree with the above.  Forest Gleason, MD

## 2022-05-13 NOTE — Patient Instructions (Signed)
Due to recent changes in healthcare laws, you may see results of your pathology and/or laboratory studies on MyChart before the doctors have had a chance to review them. We understand that in some cases there may be results that are confusing or concerning to you. Please understand that not all results are received at the same time and often the doctors may need to interpret multiple results in order to provide you with the best plan of care or course of treatment. Therefore, we ask that you please give us 2 business days to thoroughly review all your results before contacting the office for clarification. Should we see a critical lab result, you will be contacted sooner.   If You Need Anything After Your Visit  If you have any questions or concerns for your doctor, please call our main line at [phone] and press option 4 to reach your doctor's medical assistant. If no one answers, please leave a voicemail as directed and we will return your call as soon as possible. Messages left after 4 pm will be answered the following business day.   You may also send us a message via MyChart. We typically respond to MyChart messages within 1-2 business days.  For prescription refills, please ask your pharmacy to contact our office. Our fax number is [phone].  If you have an urgent issue when the clinic is closed that cannot wait until the next business day, you can page your doctor at the number below.    Please note that while we do our best to be available for urgent issues outside of office hours, we are not available 24/7.   If you have an urgent issue and are unable to reach us, you may choose to seek medical care at your doctor's office, retail clinic, urgent care center, or emergency room.  If you have a medical emergency, please immediately call 911 or go to the emergency department.  Pager Numbers  - Dr. Kowalski: [phone]  - Dr. Moye: [phone]  - Dr. Stewart:  [phone]  In the event of inclement weather, please call our main line at [phone] for an update on the status of any delays or closures.  Dermatology Medication Tips: Please keep the boxes that topical medications come in in order to help keep track of the instructions about where and how to use these. Pharmacies typically print the medication instructions only on the boxes and not directly on the medication tubes.   If your medication is too expensive, please contact our office at [phone] option 4 or send us a message through MyChart.   We are unable to tell what your co-pay for medications will be in advance as this is different depending on your insurance coverage. However, we may be able to find a substitute medication at lower cost or fill out paperwork to get insurance to cover a needed medication.   If a prior authorization is required to get your medication covered by your insurance company, please allow us 1-2 business days to complete this process.  Drug prices often vary depending on where the prescription is filled and some pharmacies may offer cheaper prices.  The website www.goodrx.com contains coupons for medications through different pharmacies. The prices here do not account for what the cost may be with help from insurance (it may be cheaper with your insurance), but the website can give you the price if you did not use any insurance.  - You can print the associated coupon and take it with   your prescription to the pharmacy.  - You may also stop by our office during regular business hours and pick up a GoodRx coupon card.  - If you need your prescription sent electronically to a different pharmacy, notify our office through [place] MyChart or by phone at [phone] option 4.     Si Usted Necesita Algo Despus de Su Visita  Tambin puede enviarnos un mensaje a travs de MyChart. Por lo general respondemos a los mensajes de MyChart en el transcurso de 1 a 2  das hbiles.  Para renovar recetas, por favor pida a su farmacia que se ponga en contacto con nuestra oficina. Nuestro nmero de fax es el [phone].  Si tiene un asunto urgente cuando la clnica est cerrada y que no puede esperar hasta el siguiente da hbil, puede llamar/localizar a su doctor(a) al nmero que aparece a continuacin.   Por favor, tenga en cuenta que aunque hacemos todo lo posible para estar disponibles para asuntos urgentes fuera del horario de oficina, no estamos disponibles las 24 horas del da, los 7 das de la semana.   Si tiene un problema urgente y no puede comunicarse con nosotros, puede optar por buscar atencin mdica  en el consultorio de su doctor(a), en una clnica privada, en un centro de atencin urgente o en una sala de emergencias.  Si tiene una emergencia mdica, por favor llame inmediatamente al 911 o vaya a la sala de emergencias.  Nmeros de bper  - Dr. Kowalski: [phone]  - Dra. Moye: [phone]  - Dra. Stewart: [phone]  En caso de inclemencias del tiempo, por favor llame a nuestra lnea principal al [phone] para una actualizacin sobre el estado de cualquier retraso o cierre.  Consejos para la medicacin en dermatologa: Por favor, guarde las cajas en las que vienen los medicamentos de uso tpico para ayudarle a seguir las instrucciones sobre dnde y cmo usarlos. Las farmacias generalmente imprimen las instrucciones del medicamento slo en las cajas y no directamente en los tubos del medicamento.   Si su medicamento es muy caro, por favor, pngase en contacto con nuestra oficina llamando al [phone] y presione la opcin 4 o envenos un mensaje a travs de MyChart.   No podemos decirle cul ser su copago por los medicamentos por adelantado ya que esto es diferente dependiendo de la cobertura de su seguro. Sin embargo, es posible que podamos encontrar un medicamento sustituto a menor costo o llenar un formulario para que el  seguro cubra el medicamento que se considera necesario.   Si se requiere una autorizacin previa para que su compaa de seguros cubra su medicamento, por favor permtanos de 1 a 2 das hbiles para completar este proceso.  Los precios de los medicamentos varan con frecuencia dependiendo del lugar de dnde se surte la receta y alguna farmacias pueden ofrecer precios ms baratos.  El sitio web www.goodrx.com tiene cupones para medicamentos de diferentes farmacias. Los precios aqu no tienen en cuenta lo que podra costar con la ayuda del seguro (puede ser ms barato con su seguro), pero el sitio web puede darle el precio si no utiliz ningn seguro.  - Puede imprimir el cupn correspondiente y llevarlo con su receta a la farmacia.  - Tambin puede pasar por nuestra oficina durante el horario de atencin regular y recoger una tarjeta de cupones de GoodRx.  - Si necesita que su receta se enve electrnicamente a una farmacia diferente, informe a nuestra oficina a travs de MyChart de [place]   o por telfono llamando al [phone] y presione la opcin 4.  

## 2022-05-15 ENCOUNTER — Encounter: Payer: Self-pay | Admitting: Dermatology

## 2022-05-15 ENCOUNTER — Ambulatory Visit (INDEPENDENT_AMBULATORY_CARE_PROVIDER_SITE_OTHER): Payer: Medicare Other | Admitting: Dermatology

## 2022-05-15 DIAGNOSIS — Z5189 Encounter for other specified aftercare: Secondary | ICD-10-CM

## 2022-05-15 DIAGNOSIS — L97819 Non-pressure chronic ulcer of other part of right lower leg with unspecified severity: Secondary | ICD-10-CM | POA: Diagnosis not present

## 2022-05-15 NOTE — Patient Instructions (Signed)
Due to recent changes in healthcare laws, you may see results of your pathology and/or laboratory studies on MyChart before the doctors have had a chance to review them. We understand that in some cases there may be results that are confusing or concerning to you. Please understand that not all results are received at the same time and often the doctors may need to interpret multiple results in order to provide you with the best plan of care or course of treatment. Therefore, we ask that you please give us 2 business days to thoroughly review all your results before contacting the office for clarification. Should we see a critical lab result, you will be contacted sooner.   If You Need Anything After Your Visit  If you have any questions or concerns for your doctor, please call our main line at [phone] and press option 4 to reach your doctor's medical assistant. If no one answers, please leave a voicemail as directed and we will return your call as soon as possible. Messages left after 4 pm will be answered the following business day.   You may also send us a message via MyChart. We typically respond to MyChart messages within 1-2 business days.  For prescription refills, please ask your pharmacy to contact our office. Our fax number is [phone].  If you have an urgent issue when the clinic is closed that cannot wait until the next business day, you can page your doctor at the number below.    Please note that while we do our best to be available for urgent issues outside of office hours, we are not available 24/7.   If you have an urgent issue and are unable to reach us, you may choose to seek medical care at your doctor's office, retail clinic, urgent care center, or emergency room.  If you have a medical emergency, please immediately call 911 or go to the emergency department.  Pager Numbers  - Dr. Kowalski: [phone]  - Dr. Moye: [phone]  - Dr. Stewart:  [phone]  In the event of inclement weather, please call our main line at [phone] for an update on the status of any delays or closures.  Dermatology Medication Tips: Please keep the boxes that topical medications come in in order to help keep track of the instructions about where and how to use these. Pharmacies typically print the medication instructions only on the boxes and not directly on the medication tubes.   If your medication is too expensive, please contact our office at [phone] option 4 or send us a message through MyChart.   We are unable to tell what your co-pay for medications will be in advance as this is different depending on your insurance coverage. However, we may be able to find a substitute medication at lower cost or fill out paperwork to get insurance to cover a needed medication.   If a prior authorization is required to get your medication covered by your insurance company, please allow us 1-2 business days to complete this process.  Drug prices often vary depending on where the prescription is filled and some pharmacies may offer cheaper prices.  The website www.goodrx.com contains coupons for medications through different pharmacies. The prices here do not account for what the cost may be with help from insurance (it may be cheaper with your insurance), but the website can give you the price if you did not use any insurance.  - You can print the associated coupon and take it with   your prescription to the pharmacy.  - You may also stop by our office during regular business hours and pick up a GoodRx coupon card.  - If you need your prescription sent electronically to a different pharmacy, notify our office through [facility] MyChart or by phone at [phone] option 4.     Si Usted Necesita Algo Despus de Su Visita  Tambin puede enviarnos un mensaje a travs de MyChart. Por lo general respondemos a los mensajes de MyChart en el transcurso de 1 a 2  das hbiles.  Para renovar recetas, por favor pida a su farmacia que se ponga en contacto con nuestra oficina. Nuestro nmero de fax es el [phone].  Si tiene un asunto urgente cuando la clnica est cerrada y que no puede esperar hasta el siguiente da hbil, puede llamar/localizar a su doctor(a) al nmero que aparece a continuacin.   Por favor, tenga en cuenta que aunque hacemos todo lo posible para estar disponibles para asuntos urgentes fuera del horario de oficina, no estamos disponibles las 24 horas del da, los 7 das de la semana.   Si tiene un problema urgente y no puede comunicarse con nosotros, puede optar por buscar atencin mdica  en el consultorio de su doctor(a), en una clnica privada, en un centro de atencin urgente o en una sala de emergencias.  Si tiene una emergencia mdica, por favor llame inmediatamente al 911 o vaya a la sala de emergencias.  Nmeros de bper  - Dr. Kowalski: [phone]  - Dra. Moye: [phone]  - Dra. Stewart: [phone]  En caso de inclemencias del tiempo, por favor llame a nuestra lnea principal al [phone] para una actualizacin sobre el estado de cualquier retraso o cierre.  Consejos para la medicacin en dermatologa: Por favor, guarde las cajas en las que vienen los medicamentos de uso tpico para ayudarle a seguir las instrucciones sobre dnde y cmo usarlos. Las farmacias generalmente imprimen las instrucciones del medicamento slo en las cajas y no directamente en los tubos del medicamento.   Si su medicamento es muy caro, por favor, pngase en contacto con nuestra oficina llamando al [phone] y presione la opcin 4 o envenos un mensaje a travs de MyChart.   No podemos decirle cul ser su copago por los medicamentos por adelantado ya que esto es diferente dependiendo de la cobertura de su seguro. Sin embargo, es posible que podamos encontrar un medicamento sustituto a menor costo o llenar un formulario para que el  seguro cubra el medicamento que se considera necesario.   Si se requiere una autorizacin previa para que su compaa de seguros cubra su medicamento, por favor permtanos de 1 a 2 das hbiles para completar este proceso.  Los precios de los medicamentos varan con frecuencia dependiendo del lugar de dnde se surte la receta y alguna farmacias pueden ofrecer precios ms baratos.  El sitio web www.goodrx.com tiene cupones para medicamentos de diferentes farmacias. Los precios aqu no tienen en cuenta lo que podra costar con la ayuda del seguro (puede ser ms barato con su seguro), pero el sitio web puede darle el precio si no utiliz ningn seguro.  - Puede imprimir el cupn correspondiente y llevarlo con su receta a la farmacia.  - Tambin puede pasar por nuestra oficina durante el horario de atencin regular y recoger una tarjeta de cupones de GoodRx.  - Si necesita que su receta se enve electrnicamente a una farmacia diferente, informe a nuestra oficina a travs de MyChart de [facility]   o por telfono llamando al [phone] y presione la opcin 4.  

## 2022-05-15 NOTE — Progress Notes (Signed)
   Follow-Up Visit   Subjective  Renee Powell is a 88 y.o. female who presents for the following: Wound Check (Recheck right lower leg. Hx of BCC Tx with EDC. Bacterial C&S have been negative for infection. Patient c/o pain and non healing. Unable to sleep at night due to pain).   The following portions of the chart were reviewed this encounter and updated as appropriate:  Tobacco  Allergies  Meds  Problems  Med Hx  Surg Hx  Fam Hx      Review of Systems: No other skin or systemic complaints except as noted in HPI or Assessment and Plan.   Objective  Well appearing patient in no apparent distress; mood and affect are within normal limits.  A focused examination was performed including right lower leg. Relevant physical exam findings are noted in the Assessment and Plan.  right pretibial Ulcerated surgical wound with central eschar   Assessment & Plan  Encounter for wound re-check right pretibial  Wound debrided today. Petroleum jelly and Duoderm applied.  Unna boot applied today.   RTC Wednesday for recheck  Remv/revisn boot/body cast - right pretibial  Debridement - right pretibial   Return in about 6 days (around 05/21/2022) for [employer] .  I, Emelia Salisbury, CMA, am acting as scribe for Forest Gleason, MD.  Documentation: I have reviewed the above documentation for accuracy and completeness, and I agree with the above.  Forest Gleason, MD

## 2022-05-19 ENCOUNTER — Ambulatory Visit (INDEPENDENT_AMBULATORY_CARE_PROVIDER_SITE_OTHER): Payer: Medicare Other | Admitting: Obstetrics & Gynecology

## 2022-05-19 ENCOUNTER — Ambulatory Visit: Payer: Medicare Other | Admitting: Obstetrics & Gynecology

## 2022-05-19 ENCOUNTER — Encounter: Payer: Self-pay | Admitting: Obstetrics & Gynecology

## 2022-05-19 VITALS — BP 120/62 | Ht 62.0 in | Wt 140.0 lb

## 2022-05-19 DIAGNOSIS — Z4689 Encounter for fitting and adjustment of other specified devices: Secondary | ICD-10-CM

## 2022-05-19 DIAGNOSIS — N814 Uterovaginal prolapse, unspecified: Secondary | ICD-10-CM

## 2022-05-19 DIAGNOSIS — N8110 Cystocele, unspecified: Secondary | ICD-10-CM

## 2022-05-19 NOTE — Progress Notes (Addendum)
   Established Patient Office Visit  Subjective   Patient ID: Renee MASSAR, female    DOB: [birth year]  Age: 88 y.o. MRN: [id number]  Chief Complaint  Patient presents with   Pessary Check    HPI    ROS    88 yo widowed P2 is here to have her new, larger ring pessary placed. She has used a #3 ring with support for years but reported at her last visit 2 weeks ago that it was falling out. I ordered a #4 ring with support and she is here to have it inserted. Objective:     BP 120/62   Ht '5\' 2"'$  (1.575 m)   Wt 140 lb (63.5 kg)   BMI 25.61 kg/m    Physical Exam Well nourished, well hydrated White female, no apparent distress She is ambulating and conversing normally. I removed the current pessary and cleaned it. I inspected her vagina with a speculum and noted only healthy vaginal mucosa. I placed the new #4 and she reported that it relieved her symptoms of prolapse and did not cause any discomfort (said she could not feel it at all).    The ASCVD Risk score (Arnett DK, et al., 2019) failed to calculate for the following reasons:   The 2019 ASCVD risk score is only valid for ages 37 to 70    Assessment & Plan:   Problem List Items Addressed This Visit     Uterine prolapse   Pessary maintenance   Other Visit Diagnoses     Baden-Walker grade 4 cystocele    -  Primary       Return in about 3 months (around 08/18/2022) for pessary maintainence.    Renee Filbert, Renee Powell   She returned at 2 pm today saying that she was feeling the prolpase and that the pessary was too low.  I removed the new #4 and placed a #5 ring with support.  She reports that at this time it is relieving her prolapse and not causing any discomfort. She will come back in 3 months/prn sooner

## 2022-05-21 ENCOUNTER — Ambulatory Visit (INDEPENDENT_AMBULATORY_CARE_PROVIDER_SITE_OTHER): Payer: Medicare Other | Admitting: Dermatology

## 2022-05-21 DIAGNOSIS — L97819 Non-pressure chronic ulcer of other part of right lower leg with unspecified severity: Secondary | ICD-10-CM | POA: Diagnosis not present

## 2022-05-21 DIAGNOSIS — Z4800 Encounter for change or removal of nonsurgical wound dressing: Secondary | ICD-10-CM

## 2022-05-21 NOTE — Patient Instructions (Signed)
Due to recent changes in healthcare laws, you may see results of your pathology and/or laboratory studies on MyChart before the doctors have had a chance to review them. We understand that in some cases there may be results that are confusing or concerning to you. Please understand that not all results are received at the same time and often the doctors may need to interpret multiple results in order to provide you with the best plan of care or course of treatment. Therefore, we ask that you please give us 2 business days to thoroughly review all your results before contacting the office for clarification. Should we see a critical lab result, you will be contacted sooner.   If You Need Anything After Your Visit  If you have any questions or concerns for your doctor, please call our main line at [phone] and press option 4 to reach your doctor's medical assistant. If no one answers, please leave a voicemail as directed and we will return your call as soon as possible. Messages left after 4 pm will be answered the following business day.   You may also send us a message via MyChart. We typically respond to MyChart messages within 1-2 business days.  For prescription refills, please ask your pharmacy to contact our office. Our fax number is [phone].  If you have an urgent issue when the clinic is closed that cannot wait until the next business day, you can page your doctor at the number below.    Please note that while we do our best to be available for urgent issues outside of office hours, we are not available 24/7.   If you have an urgent issue and are unable to reach us, you may choose to seek medical care at your doctor's office, retail clinic, urgent care center, or emergency room.  If you have a medical emergency, please immediately call 911 or go to the emergency department.  Pager Numbers  - Dr. Kowalski: [phone]  - Dr. Moye: [phone]  - Dr. Stewart:  [phone]  In the event of inclement weather, please call our main line at [phone] for an update on the status of any delays or closures.  Dermatology Medication Tips: Please keep the boxes that topical medications come in in order to help keep track of the instructions about where and how to use these. Pharmacies typically print the medication instructions only on the boxes and not directly on the medication tubes.   If your medication is too expensive, please contact our office at [phone] option 4 or send us a message through MyChart.   We are unable to tell what your co-pay for medications will be in advance as this is different depending on your insurance coverage. However, we may be able to find a substitute medication at lower cost or fill out paperwork to get insurance to cover a needed medication.   If a prior authorization is required to get your medication covered by your insurance company, please allow us 1-2 business days to complete this process.  Drug prices often vary depending on where the prescription is filled and some pharmacies may offer cheaper prices.  The website www.goodrx.com contains coupons for medications through different pharmacies. The prices here do not account for what the cost may be with help from insurance (it may be cheaper with your insurance), but the website can give you the price if you did not use any insurance.  - You can print the associated coupon and take it with   your prescription to the pharmacy.  - You may also stop by our office during regular business hours and pick up a GoodRx coupon card.  - If you need your prescription sent electronically to a different pharmacy, notify our office through [facility] MyChart or by phone at [phone] option 4.     Si Usted Necesita Algo Despus de Su Visita  Tambin puede enviarnos un mensaje a travs de MyChart. Por lo general respondemos a los mensajes de MyChart en el transcurso de 1 a 2  das hbiles.  Para renovar recetas, por favor pida a su farmacia que se ponga en contacto con nuestra oficina. Nuestro nmero de fax es el [phone].  Si tiene un asunto urgente cuando la clnica est cerrada y que no puede esperar hasta el siguiente da hbil, puede llamar/localizar a su doctor(a) al nmero que aparece a continuacin.   Por favor, tenga en cuenta que aunque hacemos todo lo posible para estar disponibles para asuntos urgentes fuera del horario de oficina, no estamos disponibles las 24 horas del da, los 7 das de la semana.   Si tiene un problema urgente y no puede comunicarse con nosotros, puede optar por buscar atencin mdica  en el consultorio de su doctor(a), en una clnica privada, en un centro de atencin urgente o en una sala de emergencias.  Si tiene una emergencia mdica, por favor llame inmediatamente al 911 o vaya a la sala de emergencias.  Nmeros de bper  - Dr. Kowalski: [phone]  - Dra. Moye: [phone]  - Dra. Stewart: [phone]  En caso de inclemencias del tiempo, por favor llame a nuestra lnea principal al [phone] para una actualizacin sobre el estado de cualquier retraso o cierre.  Consejos para la medicacin en dermatologa: Por favor, guarde las cajas en las que vienen los medicamentos de uso tpico para ayudarle a seguir las instrucciones sobre dnde y cmo usarlos. Las farmacias generalmente imprimen las instrucciones del medicamento slo en las cajas y no directamente en los tubos del medicamento.   Si su medicamento es muy caro, por favor, pngase en contacto con nuestra oficina llamando al [phone] y presione la opcin 4 o envenos un mensaje a travs de MyChart.   No podemos decirle cul ser su copago por los medicamentos por adelantado ya que esto es diferente dependiendo de la cobertura de su seguro. Sin embargo, es posible que podamos encontrar un medicamento sustituto a menor costo o llenar un formulario para que el  seguro cubra el medicamento que se considera necesario.   Si se requiere una autorizacin previa para que su compaa de seguros cubra su medicamento, por favor permtanos de 1 a 2 das hbiles para completar este proceso.  Los precios de los medicamentos varan con frecuencia dependiendo del lugar de dnde se surte la receta y alguna farmacias pueden ofrecer precios ms baratos.  El sitio web www.goodrx.com tiene cupones para medicamentos de diferentes farmacias. Los precios aqu no tienen en cuenta lo que podra costar con la ayuda del seguro (puede ser ms barato con su seguro), pero el sitio web puede darle el precio si no utiliz ningn seguro.  - Puede imprimir el cupn correspondiente y llevarlo con su receta a la farmacia.  - Tambin puede pasar por nuestra oficina durante el horario de atencin regular y recoger una tarjeta de cupones de GoodRx.  - Si necesita que su receta se enve electrnicamente a una farmacia diferente, informe a nuestra oficina a travs de MyChart de [facility]   o por telfono llamando al [phone] y presione la opcin 4.  

## 2022-05-21 NOTE — Progress Notes (Signed)
   Follow-Up Visit   Subjective  Renee Powell is a 88 y.o. female who presents for the following: Follow-up (Patient here today for unna boot and wound recheck. Patient advises her leg is still burning. ).   The following portions of the chart were reviewed this encounter and updated as appropriate:   Tobacco  Allergies  Meds  Problems  Med Hx  Surg Hx  Fam Hx      Review of Systems:  No other skin or systemic complaints except as noted in HPI or Assessment and Plan.  Objective  Well appearing patient in no apparent distress; mood and affect are within normal limits.  A focused examination was performed including right leg. Relevant physical exam findings are noted in the Assessment and Plan.  right lower leg 1.8 cm x 1.0 cm       Assessment & Plan  Encounter for change or removal of nonsurgical wound dressing right lower leg  Unna boot removed from right leg. Wound cleansed with Puracyn, DuoDerm applied to cover wound. Leg wrapped with Unna Boot followed by coban. Patient will RTC in 1 week.   Remv/revisn boot/body cast - right lower leg   Return in about 1 week (around 05/28/2022) for unna boot.  Graciella Belton, RMA, am acting as scribe for Forest Gleason, MD .   Documentation: I have reviewed the above documentation for accuracy and completeness, and I agree with the above.  Forest Gleason, MD

## 2022-05-25 ENCOUNTER — Encounter: Payer: Self-pay | Admitting: Dermatology

## 2022-05-26 ENCOUNTER — Encounter: Payer: Self-pay | Admitting: Dermatology

## 2022-05-27 ENCOUNTER — Ambulatory Visit: Payer: Medicare Other | Admitting: Dermatology

## 2022-05-28 ENCOUNTER — Ambulatory Visit (INDEPENDENT_AMBULATORY_CARE_PROVIDER_SITE_OTHER): Payer: Medicare Other | Admitting: Dermatology

## 2022-05-28 DIAGNOSIS — S81801A Unspecified open wound, right lower leg, initial encounter: Secondary | ICD-10-CM | POA: Diagnosis not present

## 2022-05-28 DIAGNOSIS — Z4800 Encounter for change or removal of nonsurgical wound dressing: Secondary | ICD-10-CM

## 2022-05-28 DIAGNOSIS — T148XXA Other injury of unspecified body region, initial encounter: Secondary | ICD-10-CM

## 2022-05-28 NOTE — Patient Instructions (Addendum)
Apply bandage given to area at leg and change every 3 days as needed.  Due to recent changes in healthcare laws, you may see results of your pathology and/or laboratory studies on MyChart before the doctors have had a chance to review them. We understand that in some cases there may be results that are confusing or concerning to you. Please understand that not all results are received at the same time and often the doctors may need to interpret multiple results in order to provide you with the best plan of care or course of treatment. Therefore, we ask that you please give [place] 2 business days to thoroughly review all your results before contacting the office for clarification. Should we see a critical lab result, you will be contacted sooner.   If You Need Anything After Your Visit  If you have any questions or concerns for your doctor, please call our main line at [phone] and press option 4 to reach your doctor's medical assistant. If no one answers, please leave a voicemail as directed and we will return your call as soon as possible. Messages left after 4 pm will be answered the following business day.   You may also send [place] a message via [place]. We typically respond to MyChart messages within 1-2 business days.  For prescription refills, please ask your pharmacy to contact our office. Our fax number is [phone].  If you have an urgent issue when the clinic is closed that cannot wait until the next business day, you can page your doctor at the number below.    Please note that while we do our best to be available for urgent issues outside of office hours, we are not available 24/7.   If you have an urgent issue and are unable to reach [place], you may choose to seek medical care at your doctor's office, retail clinic, urgent care center, or emergency room.  If you have a medical emergency, please immediately call 911 or go to the emergency department.  Pager Numbers  - Dr. Nehemiah Massed:  [phone]  - Dr. Laurence Ferrari: [phone]  - Dr. Nicole Kindred: [phone]  In the event of inclement weather, please call our main line at [phone] for an update on the status of any delays or closures.  Dermatology Medication Tips: Please keep the boxes that topical medications come in in order to help keep track of the instructions about where and how to use these. Pharmacies typically print the medication instructions only on the boxes and not directly on the medication tubes.   If your medication is too expensive, please contact our office at [phone] option 4 or send [place] a message through [place].   We are unable to tell what your co-pay for medications will be in advance as this is different depending on your insurance coverage. However, we may be able to find a substitute medication at lower cost or fill out paperwork to get insurance to cover a needed medication.   If a prior authorization is required to get your medication covered by your insurance company, please allow [place] 1-2 business days to complete this process.  Drug prices often vary depending on where the prescription is filled and some pharmacies may offer cheaper prices.  The website www.goodrx.com contains coupons for medications through different pharmacies. The prices here do not account for what the cost may be with help from insurance (it may be cheaper with your insurance), but the website can give you the price if you did not  use any insurance.  - You can print the associated coupon and take it with your prescription to the pharmacy.  - You may also stop by our office during regular business hours and pick up a GoodRx coupon card.  - If you need your prescription sent electronically to a different pharmacy, notify our office through [facility] or by phone at [phone] option 4.     Si Usted Necesita Algo Despus de Su Visita  Tambin puede enviarnos un mensaje a travs de Pharmacist, community. Por lo general  respondemos a los mensajes de MyChart en el transcurso de 1 a 2 das hbiles.  Para renovar recetas, por favor pida a su farmacia que se ponga en contacto con nuestra oficina. Harland Dingwall de fax es [place] [phone].  Si tiene un asunto urgente cuando la clnica est cerrada y que no puede esperar hasta el siguiente da hbil, puede llamar/localizar a su doctor(a) al nmero que aparece a continuacin.   Por favor, tenga en cuenta que aunque hacemos todo lo posible para estar disponibles para asuntos urgentes fuera del horario de [place], no estamos disponibles las 24 horas del da, los 7 das de la [place].   Si tiene un problema urgente y no puede comunicarse con nosotros, puede optar por buscar atencin mdica  en el consultorio de su doctor(a), en una clnica privada, en un centro de atencin urgente o en una sala de emergencias.  Si tiene Engineering geologist, por favor llame inmediatamente al 911 o vaya a la sala de emergencias.  Nmeros de bper  - Dr. Nehemiah Massed: [phone]  - Dra. Moye: [phone]  - Dra. Nicole Kindred: [phone]  En caso de inclemencias del [place], por favor llame a Johnsie Kindred principal al [phone] para una actualizacin sobre el [place] de cualquier retraso o cierre.  Consejos para la medicacin en dermatologa: Por favor, guarde las cajas en las que vienen los medicamentos de uso tpico para ayudarle a seguir las instrucciones sobre dnde y cmo usarlos. Las farmacias generalmente imprimen las instrucciones del medicamento slo en las cajas y no directamente en los tubos del [place].   Si su medicamento es muy caro, por favor, pngase en contacto con Zigmund Daniel llamando al [phone] y presione la opcin 4 o envenos un mensaje a travs de Pharmacist, community.   No podemos decirle cul ser su copago por los medicamentos por adelantado ya que esto es diferente dependiendo de la cobertura de su seguro. Sin embargo, es posible que podamos encontrar un  medicamento sustituto a Electrical engineer un formulario para que el seguro cubra el medicamento que se considera necesario.   Si se requiere una autorizacin previa para que su compaa de seguros [place] su medicamento, por favor permtanos de 1 a 2 das hbiles para completar este proceso.  Los precios de los medicamentos varan con frecuencia dependiendo del Environmental consultant de dnde se surte la receta y alguna farmacias pueden ofrecer precios ms baratos.  El sitio web www.goodrx.com tiene cupones para medicamentos de Airline pilot. Los precios aqu no tienen en cuenta lo que podra costar con la ayuda del seguro (puede ser ms barato con su seguro), pero el sitio web puede darle el precio si no utiliz Research scientist (physical sciences).  - Puede imprimir el cupn correspondiente y llevarlo con su receta a la farmacia.  - Tambin puede pasar por nuestra oficina durante el horario de atencin regular y Charity fundraiser una tarjeta de cupones de GoodRx.  - Si necesita que su receta se enve electrnicamente  enve electrnicamente a una farmacia diferente, informe a nuestra oficina a travs de MyChart de [place] o por telfono llamando al [phone] y presione la opcin 4.  

## 2022-05-28 NOTE — Progress Notes (Signed)
   Follow-Up Visit   Subjective  Renee Powell is a 88 y.o. female who presents for the following: Follow-up (Patient here today for removal of unna boot. ).   The following portions of the chart were reviewed this encounter and updated as appropriate:   Tobacco  Allergies  Meds  Problems  Med Hx  Surg Hx  Fam Hx      Review of Systems:  No other skin or systemic complaints except as noted in HPI or Assessment and Plan.  Objective  Well appearing patient in no apparent distress; mood and affect are within normal limits.  A focused examination was performed including right leg. Relevant physical exam findings are noted in the Assessment and Plan.  Right Lower Leg See photo        Assessment & Plan  Open wound Right Lower Leg  S/p ED&C Significantly improved Patient going to the beach  Unna boot removed today. Area cleaned with Puracyn and Duo Derm applied. Patient advised to change DuoDerm every 3 days or sooner if needed. Patient was given DuoDerm cut into 3 pieces to take with her for 3 dressing changes.     Return in about 2 weeks (around 06/11/2022).  Graciella Belton, RMA, am acting as scribe for Forest Gleason, MD .  Documentation: I have reviewed the above documentation for accuracy and completeness, and I agree with the above.  Forest Gleason, MD

## 2022-05-30 ENCOUNTER — Encounter: Payer: Self-pay | Admitting: Dermatology

## 2022-05-31 ENCOUNTER — Encounter: Payer: Self-pay | Admitting: Dermatology

## 2022-06-11 ENCOUNTER — Ambulatory Visit (INDEPENDENT_AMBULATORY_CARE_PROVIDER_SITE_OTHER): Payer: Medicare Other | Admitting: Dermatology

## 2022-06-11 DIAGNOSIS — T148XXD Other injury of unspecified body region, subsequent encounter: Secondary | ICD-10-CM | POA: Diagnosis not present

## 2022-06-11 DIAGNOSIS — T148XXA Other injury of unspecified body region, initial encounter: Secondary | ICD-10-CM

## 2022-06-11 NOTE — Progress Notes (Signed)
   Follow-Up Visit   Subjective  Renee Powell is a 88 y.o. female who presents for the following: Follow-up (2 week follow up on wound at right lower leg.).  The following portions of the chart were reviewed this encounter and updated as appropriate:  Tobacco  Allergies  Meds  Problems  Med Hx  Surg Hx  Fam Hx      Review of Systems: No other skin or systemic complaints except as noted in HPI or Assessment and Plan.   Objective  Well appearing patient in no apparent distress; mood and affect are within normal limits.  A focused examination was performed including right lower leg. Relevant physical exam findings are noted in the Assessment and Plan.  Right Lower Leg - Anterior Healing wound   Assessment & Plan  Open wound Right Lower Leg - Anterior  Recommend switching to blister bandaids (hydrocolloid bandages), change every 1-3 days for another 2 weeks until healed. Wash with soap and water and dressing changes.   Return for keep follow up as scheduled . I, Ruthell Rummage, CMA, am acting as scribe for Forest Gleason, MD.  Documentation: I have reviewed the above documentation for accuracy and completeness, and I agree with the above.  Forest Gleason, MD

## 2022-06-11 NOTE — Patient Instructions (Signed)
Switch to blister bandaides apply 1 - 3 days for another 2 weeks.    Due to recent changes in healthcare laws, you may see results of your pathology and/or laboratory studies on MyChart before the doctors have had a chance to review them. We understand that in some cases there may be results that are confusing or concerning to you. Please understand that not all results are received at the same time and often the doctors may need to interpret multiple results in order to provide you with the best plan of care or course of treatment. Therefore, we ask that you please give [place] 2 business days to thoroughly review all your results before contacting the office for clarification. Should we see a critical lab result, you will be contacted sooner.   If You Need Anything After Your Visit  If you have any questions or concerns for your doctor, please call our main line at [phone] and press option 4 to reach your doctor's medical assistant. If no one answers, please leave a voicemail as directed and we will return your call as soon as possible. Messages left after 4 pm will be answered the following business day.   You may also send [place] a message via [place]. We typically respond to MyChart messages within 1-2 business days.  For prescription refills, please ask your pharmacy to contact our office. Our fax number is [phone].  If you have an urgent issue when the clinic is closed that cannot wait until the next business day, you can page your doctor at the number below.    Please note that while we do our best to be available for urgent issues outside of office hours, we are not available 24/7.   If you have an urgent issue and are unable to reach [place], you may choose to seek medical care at your doctor's office, retail clinic, urgent care center, or emergency room.  If you have a medical emergency, please immediately call 911 or go to the emergency department.  Pager Numbers  - Dr. Nehemiah Massed:  [phone]  - Dr. Laurence Ferrari: [phone]  - Dr. Nicole Kindred: [phone]  In the event of inclement weather, please call our main line at [phone] for an update on the status of any delays or closures.  Dermatology Medication Tips: Please keep the boxes that topical medications come in in order to help keep track of the instructions about where and how to use these. Pharmacies typically print the medication instructions only on the boxes and not directly on the medication tubes.   If your medication is too expensive, please contact our office at [phone] option 4 or send [place] a message through [place].   We are unable to tell what your co-pay for medications will be in advance as this is different depending on your insurance coverage. However, we may be able to find a substitute medication at lower cost or fill out paperwork to get insurance to cover a needed medication.   If a prior authorization is required to get your medication covered by your insurance company, please allow [place] 1-2 business days to complete this process.  Drug prices often vary depending on where the prescription is filled and some pharmacies may offer cheaper prices.  The website www.goodrx.com contains coupons for medications through different pharmacies. The prices here do not account for what the cost may be with help from insurance (it may be cheaper with your insurance), but the website can give you the price if you did  not use any insurance.  - You can print the associated coupon and take it with your prescription to the pharmacy.  - You may also stop by our office during regular business hours and pick up a GoodRx coupon card.  - If you need your prescription sent electronically to a different pharmacy, notify our office through [facility] or by phone at [phone] option 4.     Si Usted Necesita Algo Despus de Su Visita  Tambin puede enviarnos un mensaje a travs de Pharmacist, community. Por lo general  respondemos a los mensajes de MyChart en el transcurso de 1 a 2 das hbiles.  Para renovar recetas, por favor pida a su farmacia que se ponga en contacto con nuestra oficina. Harland Dingwall de fax es [place] [phone].  Si tiene un asunto urgente cuando la clnica est cerrada y que no puede esperar hasta el siguiente da hbil, puede llamar/localizar a su doctor(a) al nmero que aparece a continuacin.   Por favor, tenga en cuenta que aunque hacemos todo lo posible para estar disponibles para asuntos urgentes fuera del horario de [place], no estamos disponibles las 24 horas del da, los 7 das de la [place].   Si tiene un problema urgente y no puede comunicarse con nosotros, puede optar por buscar atencin mdica  en el consultorio de su doctor(a), en una clnica privada, en un centro de atencin urgente o en una sala de emergencias.  Si tiene Engineering geologist, por favor llame inmediatamente al 911 o vaya a la sala de emergencias.  Nmeros de bper  - Dr. Nehemiah Massed: [phone]  - Dra. Moye: [phone]  - Dra. Nicole Kindred: [phone]  En caso de inclemencias del [place], por favor llame a Johnsie Kindred principal al [phone] para una actualizacin sobre el [place] de cualquier retraso o cierre.  Consejos para la medicacin en dermatologa: Por favor, guarde las cajas en las que vienen los medicamentos de uso tpico para ayudarle a seguir las instrucciones sobre dnde y cmo usarlos. Las farmacias generalmente imprimen las instrucciones del medicamento slo en las cajas y no directamente en los tubos del [place].   Si su medicamento es muy caro, por favor, pngase en contacto con Zigmund Daniel llamando al [phone] y presione la opcin 4 o envenos un mensaje a travs de Pharmacist, community.   No podemos decirle cul ser su copago por los medicamentos por adelantado ya que esto es diferente dependiendo de la cobertura de su seguro. Sin embargo, es posible que podamos encontrar un  medicamento sustituto a Electrical engineer un formulario para que el seguro cubra el medicamento que se considera necesario.   Si se requiere una autorizacin previa para que su compaa de seguros [place] su medicamento, por favor permtanos de 1 a 2 das hbiles para completar este proceso.  Los precios de los medicamentos varan con frecuencia dependiendo del Environmental consultant de dnde se surte la receta y alguna farmacias pueden ofrecer precios ms baratos.  El sitio web www.goodrx.com tiene cupones para medicamentos de Airline pilot. Los precios aqu no tienen en cuenta lo que podra costar con la ayuda del seguro (puede ser ms barato con su seguro), pero el sitio web puede darle el precio si no utiliz Research scientist (physical sciences).  - Puede imprimir el cupn correspondiente y llevarlo con su receta a la farmacia.  - Tambin puede pasar por nuestra oficina durante el horario de atencin regular y Charity fundraiser una tarjeta de cupones de GoodRx.  - Si necesita que su receta se enve  enve electrnicamente a una farmacia diferente, informe a nuestra oficina a travs de MyChart de [place] o por telfono llamando al [phone] y presione la opcin 4.  

## 2022-06-12 ENCOUNTER — Other Ambulatory Visit: Payer: Self-pay | Admitting: Family Medicine

## 2022-06-12 DIAGNOSIS — Z1231 Encounter for screening mammogram for malignant neoplasm of breast: Secondary | ICD-10-CM

## 2022-06-25 ENCOUNTER — Encounter: Payer: Self-pay | Admitting: Dermatology

## 2022-07-13 ENCOUNTER — Other Ambulatory Visit: Payer: Self-pay | Admitting: Family Medicine

## 2022-07-14 NOTE — Telephone Encounter (Signed)
Requested Prescriptions  Pending Prescriptions Disp Refills   amLODipine (NORVASC) 5 MG tablet [Pharmacy Med Name: AMLODIPINE BESYLATE 5 MG TAB] 90 tablet 0    Sig: TAKE 1 TABLET (5 MG TOTAL) BY MOUTH DAILY.     Cardiovascular: Calcium Channel Blockers 2 Passed - 07/13/2022  5:21 PM      Passed - Last BP in normal range    BP Readings from Last 1 Encounters:  05/19/22 120/62         Passed - Last Heart Rate in normal range    Pulse Readings from Last 1 Encounters:  01/28/22 65         Passed - Valid encounter within last 6 months    Recent Outpatient Visits           5 months ago Primary hypertension   [facility] Jerrol Banana., MD   8 months ago Primary hypertension   [facility] Jerrol Banana., MD   11 months ago Hypothyroidism, unspecified type   [facility] Jerrol Banana., MD   1 year ago Annual physical exam   [facility] Jerrol Banana., MD   2 years ago Herpes zoster with ophthalmic complication, unspecified herpes zoster eye disease   [facility] Jerrol Banana., MD       Future Appointments             In 2 months Simmons-Robinson, Riki Sheer, MD [facility], [place]

## 2022-07-28 ENCOUNTER — Ambulatory Visit: Payer: Medicare Other | Admitting: Dermatology

## 2022-08-25 ENCOUNTER — Encounter: Payer: Self-pay | Admitting: Obstetrics & Gynecology

## 2022-08-25 ENCOUNTER — Ambulatory Visit
Admission: RE | Admit: 2022-08-25 | Discharge: 2022-08-25 | Disposition: A | Discharge status: Home / Self Care | Payer: Medicare Other | Source: Ambulatory Visit | Attending: Family Medicine | Admitting: Family Medicine

## 2022-08-25 ENCOUNTER — Ambulatory Visit (INDEPENDENT_AMBULATORY_CARE_PROVIDER_SITE_OTHER): Payer: Medicare Other | Admitting: Obstetrics & Gynecology

## 2022-08-25 VITALS — BP 136/68 | HR 73 | Ht 62.0 in | Wt 137.0 lb

## 2022-08-25 DIAGNOSIS — Z4689 Encounter for fitting and adjustment of other specified devices: Secondary | ICD-10-CM | POA: Diagnosis not present

## 2022-08-25 DIAGNOSIS — N8110 Cystocele, unspecified: Secondary | ICD-10-CM | POA: Diagnosis not present

## 2022-08-25 DIAGNOSIS — Z1231 Encounter for screening mammogram for malignant neoplasm of breast: Secondary | ICD-10-CM | POA: Insufficient documentation

## 2022-08-25 DIAGNOSIS — N814 Uterovaginal prolapse, unspecified: Secondary | ICD-10-CM

## 2022-08-25 NOTE — Progress Notes (Signed)
   Established Patient Office Visit  Subjective   Patient ID: Renee Powell, female    DOB: May 25, 1934  Age: 88 y.o. MRN: [id number]  Chief Complaint  Patient presents with   Pessary Check    HPI    88 yo widowed P2 here for pessary maintenance. She now has a #5 ring with support. She is very happy with this one. She has no complaints today.  She will have her mammogram tomorrow.  Objective:     BP 136/68   Pulse 73   Ht '5\' 2"'$  (1.575 m)   Wt 137 lb (62.1 kg)   BMI 25.06 kg/m    Physical Exam   Well nourished, well hydrated White female, no apparent distress She is ambulating and conversing normally. I removed the current pessary and cleaned it. I inspected her vagina with a speculum and noted only healthy vaginal mucosa. Assessment & Plan:  Uterine prolapse with Grade 4 cystocele- doing well with pessary She will come back in 3 months for cleaning/prn sooner  Problem List Items Addressed This Visit   None   No follow-ups on file.    Emily Filbert, MD

## 2022-09-15 NOTE — Progress Notes (Unsigned)
Complete physical exam   Patient: Renee Powell   DOB: 1934/06/14   89 y.o. Female  MRN: [id number] Visit Date: 09/16/2022  Today's healthcare provider: [name], MD   No chief complaint on file.  Subjective    Renee Powell is a 89 y.o. female who presents today for a complete physical exam.    She reports consuming a general diet. The patient does not participate in regular exercise at present. She generally feels well.  She reports sleeping well. She does not have additional problems to discuss today.    She follows with Dr. Ronnald Collum for endocrine and has appt next week for labs   She sees dr.dove for urology follow up for now, previously saw dr Kenton Kingfisher  HPI   She has been diagnosed with shingles 2 years ago in her eye and states that she has been dealing with this but has follow up scheduled soon and has eye drops to treat this problem. She reports she has been put on new drops for elevated pressures. She follows with Dr. Wallace Going and will meet with specialist on this upcoming Friday. She declines influenza, Shingrix and COVID booster vaccines today stating that she was advised to wait on further immunization until stabilized    Past Medical History:  Diagnosis Date   Basal cell carcinoma 03/19/2022   right pretibia, [facility] 04/15/2022   Breast cancer ([place]) 2002   right lumpectomy with radiation   Cancer [facility]) 2002   breast   Hyperlipidemia    Personal history of radiation therapy 2002   Right breast   Shingles of eyelid    Thyroid disease 09/2019   Pt had tumors treated with radiation therapy   Past Surgical History:  Procedure Laterality Date   BREAST BIOPSY Right 2002   breast ca radation   BREAST SURGERY Right 2002   lumpectomy   COLONOSCOPY WITH PROPOFOL N/A 05/06/2017   Procedure: COLONOSCOPY WITH PROPOFOL;  Surgeon: [name], MD;  Location: [facility] ENDOSCOPY;  Service: Gastroenterology;  Laterality: N/A;   Social History   Socioeconomic  History   Marital status: Widowed    Spouse name: Not on file   Number of children: 2   Years of education: Not on file   Highest education level: 12th grade  Occupational History   Occupation: retired  Tobacco Use   Smoking status: Never   Smokeless tobacco: Never  Vaping Use   Vaping Use: Never used  Substance and Sexual Activity   Alcohol use: No    Alcohol/week: 0.0 standard drinks of alcohol   Drug use: No   Sexual activity: Not Currently  Other Topics Concern   Not on file  Social History Narrative   Not on file   Social Determinants of Health   Financial Resource Strain: Low Risk  (01/07/2022)   Overall Financial Resource Strain (CARDIA)    Difficulty of Paying Living Expenses: Not hard at all  Food Insecurity: No Food Insecurity (01/07/2022)   Hunger Vital Sign    Worried About Running Out of Food in the Last Year: Never true    [place] in the Last Year: Never true  Transportation Needs: No Transportation Needs (01/07/2022)   PRAPARE - Hydrologist (Medical): No    Lack of Transportation (Non-Medical): No  Physical Activity: Inactive (01/07/2022)   Exercise Vital Sign    Days of Exercise per Week: 0 days    Minutes of Exercise per  Session: 0 min  Stress: No Stress Concern Present (01/07/2022)   [place]    Feeling of Stress : Only a little  Social Connections: Moderately Isolated (01/07/2022)   Social Connection and Isolation Panel [NHANES]    Frequency of Communication with Friends and Family: More than three times a week    Frequency of Social Gatherings with Friends and Family: More than three times a week    Attends Religious Services: More than 4 times per year    Active Member of [employer] or Organizations: No    Attends Archivist Meetings: Never    Marital Status: Widowed  Intimate Partner Violence: Not At Risk (01/07/2022)   Humiliation, Afraid, Rape,  and Kick questionnaire    Fear of Current or Ex-Partner: No    Emotionally Abused: No    Physically Abused: No    Sexually Abused: No   Family Status  Relation Name Status   Father  Deceased   Mat Uncle  Deceased   Pat Aunt  Deceased   Pat Uncle  Deceased   Annamarie Major  Deceased   Mother  Deceased   Son  Alive   Son  Alive   PGM  (Not Specified)   Mat Aunt  (Not Specified)   MGM  (Not Specified)   Family History  Problem Relation Age of Onset   Cancer Father        lung cancer   Cancer Maternal Uncle    Cancer Paternal Aunt        Breast   Cancer Paternal Uncle        Kidney   Cancer Paternal Uncle        Lung   Heart attack Mother    Cancer Paternal Grandmother        Breast   Breast cancer Maternal Aunt 60   Breast cancer Maternal Grandmother 60   Allergies  Allergen Reactions   Latex     Patient Care Team: [name], MD as PCP - General (Family Medicine) Morayati, Lourdes Sledge, MD as Attending Physician (Endocrinology) Gae Dry, MD as Referring Physician (Obstetrics and Gynecology) Leandrew Koyanagi, MD as Referring Physician (Ophthalmology)   Medications: Outpatient Medications Prior to Visit  Medication Sig   brimonidine (ALPHAGAN) 0.2 % ophthalmic solution Place 1 drop into the left eye 3 (three) times daily.   carboxymethylcellulose (REFRESH PLUS) 0.5 % SOLN 1 drop 3 (three) times daily as needed.   COMBIGAN 0.2-0.5 % ophthalmic solution    dorzolamide-timolol (COSOPT) 2-0.5 % ophthalmic solution Place 1 drop into the left eye 2 (two) times daily.   levothyroxine (SYNTHROID, LEVOTHROID) 88 MCG tablet Take 88 mcg daily before breakfast by mouth.   PREDNISOLONE ACETATE P-F 1 % ophthalmic suspension Place 1 drop into the left eye 3 (three) times daily.   [DISCONTINUED] amLODipine (NORVASC) 5 MG tablet TAKE 1 TABLET (5 MG TOTAL) BY MOUTH DAILY.   [DISCONTINUED] mupirocin ointment (BACTROBAN) 2 % Apply 1 Application topically daily.    [DISCONTINUED] valACYclovir (VALTREX) 500 MG tablet Take 1 tablet (500 mg total) by mouth daily.   [DISCONTINUED] oxybutynin (DITROPAN XL) 10 MG 24 hr tablet Take 1 tablet (10 mg total) by mouth at bedtime. (Patient not taking: Reported on 09/16/2022)   No facility-administered medications prior to visit.    Review of Systems    Objective    BP 136/68 (BP Location: Right Arm, Patient Position: Sitting, Cuff Size: Normal)  Pulse (!) 57   Wt 139 lb (63 kg)   SpO2 100%   BMI 25.42 kg/m     Physical Exam Vitals reviewed.  Constitutional:      General: She is not in acute distress.    Appearance: Normal appearance. She is not ill-appearing, toxic-appearing or diaphoretic.  HENT:     Head: Normocephalic and atraumatic.     Right Ear: Tympanic membrane and external ear normal. There is no impacted cerumen.     Left Ear: Tympanic membrane and external ear normal. There is no impacted cerumen.     Nose: Nose normal.     Mouth/Throat:     Pharynx: Oropharynx is clear.  Eyes:     General: Visual field deficit present. No scleral icterus.    Extraocular Movements: Extraocular movements intact.     Conjunctiva/sclera: Conjunctivae normal.     Pupils: Pupils are equal, round, and reactive to light.     Comments: Photosensitivity  Decreased visual acuity in left eye   Cardiovascular:     Rate and Rhythm: Normal rate and regular rhythm.     Pulses: Normal pulses.     Heart sounds: Normal heart sounds. No murmur heard.    No friction rub. No gallop.  Pulmonary:     Effort: Pulmonary effort is normal. No respiratory distress.     Breath sounds: Normal breath sounds. No wheezing, rhonchi or rales.  Abdominal:     General: Bowel sounds are normal. There is no distension.     Palpations: Abdomen is soft. There is no mass.     Tenderness: There is no abdominal tenderness. There is no guarding.  Musculoskeletal:        General: No deformity.     Cervical back: Normal range of motion and  neck supple. No rigidity.     Right lower leg: No edema.     Left lower leg: No edema.  Lymphadenopathy:     Cervical: No cervical adenopathy.  Skin:    General: Skin is warm.     Capillary Refill: Capillary refill takes less than 2 seconds.     Findings: No erythema or rash.  Neurological:     General: No focal deficit present.     Mental Status: She is alert and oriented to person, place, and time.     Cranial Nerves: No cranial nerve deficit, dysarthria or facial asymmetry.     Sensory: Sensation is intact.     Motor: No weakness.     Gait: Gait is intact. Gait normal.  Psychiatric:        Mood and Affect: Mood normal.        Behavior: Behavior normal.      Last depression screening scores    01/28/2022    9:28 AM 01/07/2022    9:05 AM 10/22/2021    8:45 AM  PHQ 2/9 Scores  PHQ - 2 Score 0 1 0  PHQ- 9 Score 2  2   Last fall risk screening    01/28/2022    9:28 AM  [place] in the past year? 0  Number falls in past yr: 0  Injury with Fall? 0  Risk for fall due to : No Fall Risks;Impaired balance/gait  Follow up Falls evaluation completed   Last Audit-C alcohol use screening    01/28/2022    9:28 AM  Alcohol Use Disorder Test (AUDIT)  1. How often do you have a drink containing alcohol? 0  2. How many drinks containing alcohol do you have on a typical day when you are drinking? 0  3. How often do you have six or more drinks on one occasion? 0  AUDIT-C Score 0   A score of 3 or more in women, and 4 or more in men indicates increased risk for alcohol abuse, EXCEPT if all of the points are from question 1   No results found for any visits on 09/16/22.  Assessment & Plan    Routine Health Maintenance and Physical Exam  Exercise Activities and Dietary recommendations  Goals      DIET - EAT MORE FRUITS AND VEGETABLES     DIET - INCREASE WATER INTAKE     Recommend increasing water intake to 3 glasses a day.         Immunization History   Administered Date(s) Administered   PFIZER(Purple Top)SARS-COV-2 Vaccination 11/09/2019, 11/30/2019    Health Maintenance  Topic Date Due   DTaP/Tdap/Td (1 - Tdap) Never done   Pneumonia Vaccine 77+ Years old (1 - PCV) Never done   COVID-19 Vaccine (3 - Pfizer risk series) 10/02/2022 (Originally 12/28/2019)   INFLUENZA VACCINE  12/07/2022 (Originally 04/08/2022)   Zoster Vaccines- Shingrix (1 of 2) 12/16/2022 (Originally 12/13/1952)   DEXA SCAN  09/17/2023 (Originally 01/01/2011)   Medicare Annual Wellness (AWV)  01/08/2023   HPV VACCINES  Aged Out    Discussed health benefits of physical activity, and encouraged her to engage in regular exercise appropriate for her age and condition.  Problem List Items Addressed This Visit       Cardiovascular and Mediastinum   Primary hypertension - Primary    BP within goal range  She will continue amlodipine '5mg'$  daily  Declines CMP today as she has upcoming lab appt with her endocrinologist  Refills provided for amlodipine       Relevant Medications   amLODipine (NORVASC) 5 MG tablet     Endocrine   Disease of thyroid gland    Followed by endocrinology with upcoming appt this week  Stable on synthroid 58mg        Return in about 1 year (around 09/17/2023) for CPE.     I, MEulis Foster MD, have reviewed all documentation for this visit.  Portions of this information were initially documented by the CMA and reviewed by me for thoroughness and accuracy.      MEulis Foster MD  [phone](phone) [phone](fax)  [place]

## 2022-09-16 ENCOUNTER — Encounter: Payer: Medicare Other | Admitting: Family Medicine

## 2022-09-16 ENCOUNTER — Ambulatory Visit (INDEPENDENT_AMBULATORY_CARE_PROVIDER_SITE_OTHER): Payer: Medicare Other | Admitting: Family Medicine

## 2022-09-16 ENCOUNTER — Encounter: Payer: Self-pay | Admitting: Family Medicine

## 2022-09-16 VITALS — BP 136/68 | HR 57 | Wt 139.0 lb

## 2022-09-16 DIAGNOSIS — E079 Disorder of thyroid, unspecified: Secondary | ICD-10-CM

## 2022-09-16 DIAGNOSIS — I10 Essential (primary) hypertension: Secondary | ICD-10-CM | POA: Diagnosis not present

## 2022-09-16 MED ORDER — AMLODIPINE BESYLATE 5 MG PO TABS: 5.0000 mg | ORAL_TABLET | Freq: Every day | ORAL | 1 refills | Status: DC

## 2022-09-16 NOTE — Assessment & Plan Note (Addendum)
Followed by endocrinology with upcoming appt this week  Stable on synthroid 63mg

## 2022-09-16 NOTE — Patient Instructions (Signed)
Health Maintenance After Age 87 After age 87, you are at a higher risk for certain long-term diseases and infections as well as injuries from falls. Falls are a major cause of broken bones and head injuries in people who are older than age 87. Getting regular preventive care can help to keep you healthy and well. Preventive care includes getting regular testing and making lifestyle changes as recommended by your health care provider. Talk with your health care provider about: Which screenings and tests you should have. A screening is a test that checks for a disease when you have no symptoms. A diet and exercise plan that is right for you. What should I know about screenings and tests to prevent falls? Screening and testing are the best ways to find a health problem early. Early diagnosis and treatment give you the best chance of managing medical conditions that are common after age 87. Certain conditions and lifestyle choices may make you more likely to have a fall. Your health care provider may recommend: Regular vision checks. Poor vision and conditions such as cataracts can make you more likely to have a fall. If you wear glasses, make sure to get your prescription updated if your vision changes. Medicine review. Work with your health care provider to regularly review all of the medicines you are taking, including over-the-counter medicines. Ask your health care provider about any side effects that may make you more likely to have a fall. Tell your health care provider if any medicines that you take make you feel dizzy or sleepy. Strength and balance checks. Your health care provider may recommend certain tests to check your strength and balance while standing, walking, or changing positions. Foot health exam. Foot pain and numbness, as well as not wearing proper footwear, can make you more likely to have a fall. Screenings, including: Osteoporosis screening. Osteoporosis is a condition that causes  the bones to get weaker and break more easily. Blood pressure screening. Blood pressure changes and medicines to control blood pressure can make you feel dizzy. Depression screening. You may be more likely to have a fall if you have a fear of falling, feel depressed, or feel unable to do activities that you used to do. Alcohol use screening. Using too much alcohol can affect your balance and may make you more likely to have a fall. Follow these instructions at home: Lifestyle Do not drink alcohol if: Your health care provider tells you not to drink. If you drink alcohol: Limit how much you have to: 0-1 drink a day for women. 0-2 drinks a day for men. Know how much alcohol is in your drink. In the [place] one drink equals one 12 oz bottle of beer (355 mL), one 5 oz glass of wine (148 mL), or one 1 oz glass of hard liquor (44 mL). Do not use any products that contain nicotine or tobacco. These products include cigarettes, chewing tobacco, and vaping devices, such as e-cigarettes. If you need help quitting, ask your health care provider. Activity  Follow a regular exercise program to stay fit. This will help you maintain your balance. Ask your health care provider what types of exercise are appropriate for you. If you need a cane or walker, use it as recommended by your health care provider. Wear supportive shoes that have nonskid soles. Safety  Remove any tripping hazards, such as rugs, cords, and clutter. Install safety equipment such as grab bars in bathrooms and safety rails on stairs. Keep rooms and walkways   well-lit. General instructions Talk with your health care provider about your risks for falling. Tell your health care provider if: You fall. Be sure to tell your health care provider about all falls, even ones that seem minor. You feel dizzy, tiredness (fatigue), or off-balance. Take over-the-counter and prescription medicines only as told by your health care provider. These include  supplements. Eat a healthy diet and maintain a healthy weight. A healthy diet includes low-fat dairy products, low-fat (lean) meats, and fiber from whole grains, beans, and lots of fruits and vegetables. Stay current with your vaccines. Schedule regular health, dental, and eye exams. Summary Having a healthy lifestyle and getting preventive care can help to protect your health and wellness after age 87. Screening and testing are the best way to find a health problem early and help you avoid having a fall. Early diagnosis and treatment give you the best chance for managing medical conditions that are more common for people who are older than age 87. Falls are a major cause of broken bones and head injuries in people who are older than age 87. Take precautions to prevent a fall at home. Work with your health care provider to learn what changes you can make to improve your health and wellness and to prevent falls. This information is not intended to replace advice given to you by your health care provider. Make sure you discuss any questions you have with your health care provider. Document Revised: 01/14/2021 Document Reviewed: 01/14/2021 Elsevier Patient Education  2023 Elsevier Inc.  

## 2022-09-16 NOTE — Assessment & Plan Note (Signed)
BP within goal range  She will continue amlodipine '5mg'$  daily  Declines CMP today as she has upcoming lab appt with her endocrinologist  Refills provided for amlodipine

## 2022-10-22 ENCOUNTER — Encounter: Payer: Medicare Other | Admitting: Dermatology

## 2022-10-22 ENCOUNTER — Emergency Department
Admission: EM | Admit: 2022-10-22 | Discharge: 2022-10-22 | Disposition: A | Discharge status: Home / Self Care | Payer: Medicare Other | Attending: Emergency Medicine | Admitting: Emergency Medicine

## 2022-10-22 ENCOUNTER — Emergency Department: Payer: Medicare Other

## 2022-10-22 ENCOUNTER — Encounter: Payer: Self-pay | Admitting: Emergency Medicine

## 2022-10-22 DIAGNOSIS — W19XXXA Unspecified fall, initial encounter: Secondary | ICD-10-CM

## 2022-10-22 DIAGNOSIS — S025XXA Fracture of tooth (traumatic), initial encounter for closed fracture: Secondary | ICD-10-CM | POA: Diagnosis not present

## 2022-10-22 DIAGNOSIS — S0990XA Unspecified injury of head, initial encounter: Secondary | ICD-10-CM | POA: Insufficient documentation

## 2022-10-22 DIAGNOSIS — S00502A Unspecified superficial injury of oral cavity, initial encounter: Secondary | ICD-10-CM | POA: Diagnosis present

## 2022-10-22 DIAGNOSIS — W010XXA Fall on same level from slipping, tripping and stumbling without subsequent striking against object, initial encounter: Secondary | ICD-10-CM | POA: Insufficient documentation

## 2022-10-22 NOTE — ED Provider Notes (Signed)
[facility] Provider Note    Event Date/Time   First MD Initiated Contact with Patient 10/22/22 1709     (approximate)   History   Fall   HPI  Renee Powell is a 89 y.o. female here with fall and dental pain.  The patient had a mechanical fall today.  She was at a store when she tripped on a box that was on the ground.  She fell forward, striking her face and chipping her left central incisor.  She denies loss of consciousness.  Denies any pain other than some mild facial pain now.  No neck pain.  No upper extremity numbness or weakness.  No chest pain.  No hip pain.  She has been ambulatory since the episode.  She is not on blood thinners.     Physical Exam   Triage Vital Signs: ED Triage Vitals  Enc Vitals Group     BP 10/22/22 1657 (!) 193/86     Pulse Rate 10/22/22 1657 82     Resp 10/22/22 1657 18     Temp 10/22/22 1657 98.6 F (37 C)     Temp Source 10/22/22 1657 Oral     SpO2 10/22/22 1657 98 %     Weight 10/22/22 1659 138 lb 14.2 oz (63 kg)     Height 10/22/22 1659 5' 2"$  (1.575 m)     Head Circumference --      Peak Flow --      Pain Score 10/22/22 1658 0     Pain Loc --      Pain Edu? --      Excl. in [place]? --     Most recent vital signs: Vitals:   10/22/22 1657 10/22/22 1951  BP: (!) 193/86 (!) 149/66  Pulse: 82 70  Resp: 18 19  Temp: 98.6 F (37 C) 98 F (36.7 C)  SpO2: 98% 97%     General: Awake, no distress.  CV:  Good peripheral perfusion.  Regular rate and rhythm. Resp:  Normal effort.  Abd:  No distention.  Other:  Left central incisor chipped approximately halfway through the tooth with exposed pulp.  No obvious dental impaction.  No facial deformity.  Dried blood in the left nares but no evidence of septal hematoma or facial instability.  Extraocular movements are intact.  Neck supple.  Strength out of 5 bilateral upper and lower extremities.  No midline neck pain.   ED Results / Procedures / Treatments    Labs (all labs ordered are listed, but only abnormal results are displayed) Labs Reviewed - No data to display   EKG    RADIOLOGY CT head: Likely meningioma, no acute abnormality CT C-spine: Likely osteopenia, cannot rule out metastatic disease, no acute fracture or abnormality CT face: No acute abnormality   I also independently reviewed and agree with radiologist interpretations.   PROCEDURES:  Critical Care performed: No    MEDICATIONS ORDERED IN ED: Medications - No data to display   IMPRESSION / MDM / [place] / ED COURSE  I reviewed the triage vital signs and the nursing notes.                              Differential diagnosis includes, but is not limited to, facial fracture, contusion, intracranial injury, cervical injury  Patient's presentation is most consistent with acute presentation with potential threat to life or bodily function.  Well-appearing 89 year old female here with dental trauma and facial pain after mechanical fall.  She is at her mental baseline.  CT imaging obtained, reviewed by me and radiology as above.  No acute traumatic injury noted.  Regarding her CT head findings, I suspect she has an underlying meningioma and this was discussed with her.  She will follow-up with her PCP.  She has had no headaches or focal neurological deficits.  Regarding her CT of her C-spine, she has no neck pain.  Suspect this is osteoporosis/osteopenia related, though she does have remote history of breast cancer.  She will follow-up with her PCP.  No signs of central cord injury.  Otherwise, for dental fracture, the tooth was dried and co-Poplin was applied to protect the tooth with instructions to follow-up with dentist tomorrow.    FINAL CLINICAL IMPRESSION(S) / ED DIAGNOSES   Final diagnoses:  Fall, initial encounter  Closed fracture of tooth, initial encounter     Rx / DC Orders   ED Discharge Orders     None        Note:  This  document was prepared using Dragon voice recognition software and may include unintentional dictation errors.   Duffy Bruce, MD 10/23/22 Dyann Kief

## 2022-10-22 NOTE — ED Triage Notes (Signed)
Pt reports tripping over a box in the store and landed on her face. Pt denies blood thinners or LOC. Pt chipped big tooth and has redness to nose. Ambulatory to triage.

## 2022-10-22 NOTE — Discharge Instructions (Signed)
For your tooth:  - Call your dentist tomorrow to be seen - Try to keep the packing placed today in place - Eat a soft diet until follow-up.  For your CT findings: - Discuss with your primary doctor - You may need additional imaging and follow-up based on your history - MRI showed likely benign meningioma that may need MRI - CT scan showed likely osteoporosis (fragile bones), but the differential includes multiple myeloma or cancer so should be followed up or compared to prior images

## 2022-10-27 ENCOUNTER — Telehealth: Payer: Self-pay

## 2022-10-27 ENCOUNTER — Ambulatory Visit
Admission: RE | Admit: 2022-10-27 | Discharge: 2022-10-27 | Disposition: A | Discharge status: Home / Self Care | Payer: Medicare Other | Source: Ambulatory Visit | Attending: Family Medicine | Admitting: Family Medicine

## 2022-10-27 DIAGNOSIS — W19XXXD Unspecified fall, subsequent encounter: Secondary | ICD-10-CM

## 2022-10-27 DIAGNOSIS — M47814 Spondylosis without myelopathy or radiculopathy, thoracic region: Secondary | ICD-10-CM | POA: Insufficient documentation

## 2022-10-27 DIAGNOSIS — M549 Dorsalgia, unspecified: Secondary | ICD-10-CM | POA: Insufficient documentation

## 2022-10-27 DIAGNOSIS — M47816 Spondylosis without myelopathy or radiculopathy, lumbar region: Secondary | ICD-10-CM | POA: Diagnosis not present

## 2022-10-27 NOTE — Telephone Encounter (Signed)
Copied from [place] [id number]. Topic: General - Other >> Oct 27, 2022  8:29 AM Oley Balm A wrote: Reason for CRM: Pt states that she fell at [facility] and is wanting to get a back x-ray since her back is hurting. Pt states that she fell on 10/22/22. Pt is wanting Arbie Cookey to give her a call as soon as possible to let her know once the order has been put in so she can go get the x-ray.   Please advise.

## 2022-10-27 NOTE — Telephone Encounter (Signed)
Pt called back checking to see if an order for the xray had been put in the system.  She was telling me she had a fall on feb 14 and went to the [facility] ED and they checked other things but did not check her back and it has been hurting.  CB#  [phone]  [phone]

## 2022-10-27 NOTE — Telephone Encounter (Signed)
Cervical, thoracic and lumbar xrays ordered  For patient to have completed at outpatient imaging center.   Recommend scheduling visit to follow up on results of xrays.   [name], MD  [facility]

## 2022-10-29 ENCOUNTER — Ambulatory Visit: Payer: Medicare Other | Admitting: Family Medicine

## 2022-10-29 ENCOUNTER — Encounter: Payer: Self-pay | Admitting: Family Medicine

## 2022-10-29 VITALS — BP 154/75 | HR 69 | Resp 16 | Wt 139.1 lb

## 2022-10-29 DIAGNOSIS — M545 Low back pain, unspecified: Secondary | ICD-10-CM | POA: Diagnosis not present

## 2022-10-29 DIAGNOSIS — I10 Essential (primary) hypertension: Secondary | ICD-10-CM

## 2022-10-29 MED ORDER — BACLOFEN 10 MG PO TABS: 10.0000 mg | ORAL_TABLET | Freq: Two times a day (BID) | ORAL | 0 refills | Status: DC

## 2022-10-29 MED ORDER — MELOXICAM 7.5 MG PO TABS: 7.5000 mg | ORAL_TABLET | Freq: Every day | ORAL | 0 refills | Status: DC

## 2022-10-29 NOTE — Patient Instructions (Addendum)
You were evaluated for back pain following a fall.   Please use a heating pad for 20 minute intervals 2-3 times per day   I will send a prescription for a muscle relaxer to help with the back pain as well as an antiinflammatory (Meloxicam) to help with any inflammation in the muscles that may be contributing to the pain  Your xrays did not show a fracture of the middle or lower back.  Please do the stretches we discussed today

## 2022-10-29 NOTE — Progress Notes (Unsigned)
     I,Renee Powell,acting as a scribe for [employer], MD.,have documented all relevant documentation on the behalf of Renee Foster, MD,as directed by  Renee Foster, MD while in the presence of Renee Foster, MD.   Established patient visit   Patient: Renee Powell   DOB: 09-Nov-1933   89 y.o. Female  MRN: [id number] Visit Date: 10/29/2022  Today's healthcare provider: [name], MD   Chief Complaint  Patient presents with   Follow-up   Subjective    HPI  Patient here about xray results. She got the xrays on Monday.  1 week ago she fell after tripping over a box in a store  She injured her face, had a nose bleed and had to have a tooth repaired due to a break  She is reported a bruise on the anterior portion of her left leg and one on the ankle portion of her right leg  She is reporting back pain that is extending into her leg  She denies loss of consciousness,  In the ED, she had a CT  She continued to have back pain and had xrays done two days ago  These have not resulted as of yet    Medications: Outpatient Medications Prior to Visit  Medication Sig   amLODipine (NORVASC) 5 MG tablet Take 1 tablet (5 mg total) by mouth daily.   brimonidine (ALPHAGAN) 0.2 % ophthalmic solution Place 1 drop into the left eye 3 (three) times daily.   carboxymethylcellulose (REFRESH PLUS) 0.5 % SOLN 1 drop 3 (three) times daily as needed.   dorzolamide-timolol (COSOPT) 2-0.5 % ophthalmic solution Place 1 drop into the left eye 2 (two) times daily.   levothyroxine (SYNTHROID, LEVOTHROID) 88 MCG tablet Take 88 mcg daily before breakfast by mouth.   PREDNISOLONE ACETATE P-F 1 % ophthalmic suspension Place 1 drop into the left eye 3 (three) times daily.   COMBIGAN 0.2-0.5 % ophthalmic solution  (Patient not taking: Reported on 10/29/2022)   No facility-administered medications prior to visit.    Review of Systems  {Labs  Heme   Chem  Endocrine  Serology  Results Review (optional):23779}   Objective    BP (!) 154/75 (BP Location: Right Arm, Patient Position: Sitting, Cuff Size: Normal)   Pulse 69   Resp 16   Wt 139 lb 1.6 oz (63.1 kg)   BMI 25.44 kg/m  {Show previous vital signs (optional):23777}  Physical Exam  Back: No gross deformity, scoliosis. TTP .  No midline or bony TTP. FROM. Strength LEs 5/5 all muscle groups.   2+ MSRs in patellar and achilles tendons, equal bilaterally. Negative SLRs. Sensation intact to light touch bilaterally. Negative logroll bilateral hips Negative fabers and piriformis stretches.   No results found for any visits on 10/29/22.  Assessment & Plan     Problem List Items Addressed This Visit   None    No follow-ups on file.        The entirety of the information documented in the History of Present Illness, Review of Systems and Physical Exam were personally obtained by me. Portions of this information were initially documented by *** and reviewed by me for thoroughness and accuracy.Renee Foster, MD     Renee Foster, MD  [facility] [phone] (phone) [phone] (fax)  [place]

## 2022-10-30 NOTE — Assessment & Plan Note (Addendum)
Acute problem  Pain is minimally improved from 1 week ago when injury occurred  No red flag symptoms such as lower extremity weakness nor saddle anesthesia or fecal incontinence Will prescribe baclofen 77m twice daily for 2 weeks  Will prescribe meloxicam 7.514mdaily for 2 weeks  Reviewed thoracic and lumbar xrays: no acute fractures  Demonstrated gentle stretches for the patient to complete as well as recommended using a heating pad for 20 min intervals

## 2022-10-30 NOTE — Assessment & Plan Note (Signed)
Chronic  BP elevated throughout visit   suspect that BP elevation is likely 2/2 to pain from injury  Will have patient follow up for BP check once pain from back injury during fall has improved

## 2022-11-19 ENCOUNTER — Encounter: Payer: Self-pay | Admitting: Obstetrics & Gynecology

## 2022-11-19 ENCOUNTER — Ambulatory Visit (INDEPENDENT_AMBULATORY_CARE_PROVIDER_SITE_OTHER): Payer: Medicare Other | Admitting: Obstetrics & Gynecology

## 2022-11-19 VITALS — BP 120/80 | Ht 65.0 in | Wt 139.0 lb

## 2022-11-19 DIAGNOSIS — N814 Uterovaginal prolapse, unspecified: Secondary | ICD-10-CM

## 2022-11-19 DIAGNOSIS — N8110 Cystocele, unspecified: Secondary | ICD-10-CM

## 2022-11-19 DIAGNOSIS — Z4689 Encounter for fitting and adjustment of other specified devices: Secondary | ICD-10-CM | POA: Diagnosis not present

## 2022-11-19 NOTE — Progress Notes (Signed)
    GYNECOLOGY PROGRESS NOTE  Subjective:    Patient ID: Renee Powell, female    DOB: 1934/06/22, 89 y.o.   MRN: [id number]  HPI  89 yo widowed P2 here for pessary maintenance. She now has a #5 ring with support. She is very happy with this one. She has no complaints today. She is doing well with BMs and voiding.     Objective:   Blood pressure 120/80, height 5\' 5"  (1.651 m), weight 139 lb (63 kg). Body mass index is 23.13 kg/m. Well nourished, well hydrated White female, no apparent distress  She is ambulating and conversing normally. I removed the current pessary and cleaned it. I inspected her vagina with a speculum and noted only healthy vaginal mucosa.   Extremities: extremities normal, atraumatic, no cyanosis or edema Neurologic: Grossly normal   Assessment:   1. Baden-Walker grade 4 cystocele   2. Uterine prolapse   3. Pessary maintenance      Plan:   1. Baden-Walker grade 4 cystocele  2. Uterine prolapse  3. Pessary maintenance - she will come every 3 months/prn sooner

## 2023-01-12 ENCOUNTER — Ambulatory Visit (INDEPENDENT_AMBULATORY_CARE_PROVIDER_SITE_OTHER): Payer: Medicare Other

## 2023-01-12 VITALS — Ht 65.5 in | Wt 137.8 lb

## 2023-01-12 DIAGNOSIS — Z0000 Encounter for general adult medical examination without abnormal findings: Secondary | ICD-10-CM

## 2023-01-12 NOTE — Patient Instructions (Addendum)
Renee Powell , Thank you for taking time to come for your Medicare Wellness Visit. I appreciate your ongoing commitment to your health goals. Please review the following plan we discussed and let me know if I can assist you in the future.   These are the goals we discussed:  Goals      DIET - EAT MORE FRUITS AND VEGETABLES     DIET - INCREASE WATER INTAKE     Recommend increasing water intake to 3 glasses a day.         This is a list of the screening recommended for you and due dates:  Health Maintenance  Topic Date Due   DTaP/Tdap/Td vaccine (1 - Tdap) Never done   Zoster (Shingles) Vaccine (1 of 2) Never done   Pneumonia Vaccine (1 of 1 - PCV) Never done   COVID-19 Vaccine (3 - Pfizer risk series) 12/28/2019   DEXA scan (bone density measurement)  09/17/2023*   Flu Shot  04/09/2023   Medicare Annual Wellness Visit  01/12/2024   HPV Vaccine  Aged Out  *Topic was postponed. The date shown is not the original due date.    Advanced directives: yes  Conditions/risks identified: moderate falls risk Next appointment: Follow up in one year for your annual wellness visit 01/13/2024 @ 2 pm telephone   Preventive Care 65 Years and Older, Female Preventive care refers to lifestyle choices and visits with your health care provider that can promote health and wellness. What does preventive care include? A yearly physical exam. This is also called an annual well check. Dental exams once or twice a year. Routine eye exams. Ask your health care provider how often you should have your eyes checked. Personal lifestyle choices, including: Daily care of your teeth and gums. Regular physical activity. Eating a healthy diet. Avoiding tobacco and drug use. Limiting alcohol use. Practicing safe sex. Taking low-dose aspirin every day. Taking vitamin and mineral supplements as recommended by your health care provider. What happens during an annual well check? The services and screenings done by  your health care provider during your annual well check will depend on your age, overall health, lifestyle risk factors, and family history of disease. Counseling  Your health care provider may ask you questions about your: Alcohol use. Tobacco use. Drug use. Emotional well-being. Home and relationship well-being. Sexual activity. Eating habits. History of falls. Memory and ability to understand (cognition). Work and work Astronomer. Reproductive health. Screening  You may have the following tests or measurements: Height, weight, and BMI. Blood pressure. Lipid and cholesterol levels. These may be checked every 5 years, or more frequently if you are over 52 years old. Skin check. Lung cancer screening. You may have this screening every year starting at age 6 if you have a 30-[place]-year history of smoking and currently smoke or have quit within the past 15 years. Fecal occult blood test (FOBT) of the stool. You may have this test every year starting at age 58. Flexible sigmoidoscopy or colonoscopy. You may have a sigmoidoscopy every 5 years or a colonoscopy every 10 years starting at age 38. Hepatitis C blood test. Hepatitis B blood test. Sexually transmitted disease (STD) testing. Diabetes screening. This is done by checking your blood sugar (glucose) after you have not eaten for a while (fasting). You may have this done every 1-3 years. Bone density scan. This is done to screen for osteoporosis. You may have this done starting at age 29. Mammogram. This may be done  every 1-2 years. Talk to your health care provider about how often you should have regular mammograms. Talk with your health care provider about your test results, treatment options, and if necessary, the need for more tests. Vaccines  Your health care provider may recommend certain vaccines, such as: Influenza vaccine. This is recommended every year. Tetanus, diphtheria, and acellular pertussis (Tdap, Td) vaccine. You  may need a Td booster every 10 years. Zoster vaccine. You may need this after age 8. Pneumococcal 13-valent conjugate (PCV13) vaccine. One dose is recommended after age 59. Pneumococcal polysaccharide (PPSV23) vaccine. One dose is recommended after age 41. Talk to your health care provider about which screenings and vaccines you need and how often you need them. This information is not intended to replace advice given to you by your health care provider. Make sure you discuss any questions you have with your health care provider. Document Released: 09/21/2015 Document Revised: 05/14/2016 Document Reviewed: 06/26/2015 Elsevier Interactive Patient Education  2017 [place] Prevention in the Home Falls can cause injuries. They can happen to people of all ages. There are many things you can do to make your home safe and to help prevent falls. What can I do on the outside of my home? Regularly fix the edges of walkways and driveways and fix any cracks. Remove anything that might make you trip as you walk through a door, such as a raised step or threshold. Trim any bushes or trees on the path to your home. Use bright outdoor lighting. Clear any walking paths of anything that might make someone trip, such as rocks or tools. Regularly check to see if handrails are loose or broken. Make sure that both sides of any steps have handrails. Any raised decks and porches should have guardrails on the edges. Have any leaves, snow, or ice cleared regularly. Use sand or salt on walking paths during winter. Clean up any spills in your garage right away. This includes oil or grease spills. What can I do in the bathroom? Use night lights. Install grab bars by the toilet and in the tub and shower. Do not use towel bars as grab bars. Use non-skid mats or decals in the tub or shower. If you need to sit down in the shower, use a plastic, non-slip stool. Keep the floor dry. Clean up any water that spills  on the floor as soon as it happens. Remove soap buildup in the tub or shower regularly. Attach bath mats securely with double-sided non-slip rug tape. Do not have throw rugs and other things on the floor that can make you trip. What can I do in the bedroom? Use night lights. Make sure that you have a light by your bed that is easy to reach. Do not use any sheets or blankets that are too big for your bed. They should not hang down onto the floor. Have a firm chair that has side arms. You can use this for support while you get dressed. Do not have throw rugs and other things on the floor that can make you trip. What can I do in the kitchen? Clean up any spills right away. Avoid walking on wet floors. Keep items that you use a lot in easy-to-reach places. If you need to reach something above you, use a strong step stool that has a grab bar. Keep electrical cords out of the way. Do not use floor polish or wax that makes floors slippery. If you must use wax, use  non-skid floor wax. Do not have throw rugs and other things on the floor that can make you trip. What can I do with my stairs? Do not leave any items on the stairs. Make sure that there are handrails on both sides of the stairs and use them. Fix handrails that are broken or loose. Make sure that handrails are as long as the stairways. Check any carpeting to make sure that it is firmly attached to the stairs. Fix any carpet that is loose or worn. Avoid having throw rugs at the top or bottom of the stairs. If you do have throw rugs, attach them to the floor with carpet tape. Make sure that you have a light switch at the top of the stairs and the bottom of the stairs. If you do not have them, ask someone to add them for you. What else can I do to help prevent falls? Wear shoes that: Do not have high heels. Have rubber bottoms. Are comfortable and fit you well. Are closed at the toe. Do not wear sandals. If you use a stepladder: Make  sure that it is fully opened. Do not climb a closed stepladder. Make sure that both sides of the stepladder are locked into place. Ask someone to hold it for you, if possible. Clearly mark and make sure that you can see: Any grab bars or handrails. First and last steps. Where the edge of each step is. Use tools that help you move around (mobility aids) if they are needed. These include: Canes. Walkers. Scooters. Crutches. Turn on the lights when you go into a dark area. Replace any light bulbs as soon as they burn out. Set up your furniture so you have a clear path. Avoid moving your furniture around. If any of your floors are uneven, fix them. If there are any pets around you, be aware of where they are. Review your medicines with your doctor. Some medicines can make you feel dizzy. This can increase your chance of falling. Ask your doctor what other things that you can do to help prevent falls. This information is not intended to replace advice given to you by your health care provider. Make sure you discuss any questions you have with your health care provider. Document Released: 06/21/2009 Document Revised: 01/31/2016 Document Reviewed: 09/29/2014 Elsevier Interactive Patient Education  2017 [employer].

## 2023-01-12 NOTE — Progress Notes (Signed)
I connected with  Renee Powell on 01/12/23 by a audio enabled telemedicine application and verified that I am speaking with the correct person using two identifiers.  Patient Location: Home  Provider Location: Office/Clinic  I discussed the limitations of evaluation and management by telemedicine. The patient expressed understanding and agreed to proceed.  Subjective:   Renee Powell is a 89 y.o. female who presents for Medicare Annual (Subsequent) preventive examination.  Review of Systems    Cardiac Risk Factors include: advanced age (>80men, >50 women);dyslipidemia;hypertension;sedentary lifestyle     Objective:    Today's Vitals   01/12/23 0858  Weight: 137 lb 12.8 oz (62.5 kg)  Height: 5' 5.5" (1.664 m)  PainSc: 4    Body mass index is 22.58 kg/m.     01/12/2023    9:09 AM 01/07/2022    9:08 AM 10/25/2020    9:11 AM 03/25/2018   11:21 AM 05/03/2017    2:01 PM 05/03/2017    6:35 AM 05/01/2017    2:59 PM  Advanced Directives  Does Patient Have a Medical Advance Directive? Yes No Yes Yes Yes Yes No  Type of Best boy of [place];Living will Healthcare Power of [place];Living will Healthcare Power of [place];Living will Healthcare Power of [place];Living will   Does patient want to make changes to medical advance directive?     No - Patient declined No - Patient declined   Copy of Healthcare Power of Attorney in Chart?   Yes - validated most recent copy scanned in chart (See row information) Yes No - copy requested No - copy requested   Would patient like information on creating a medical advance directive?  No - Patient declined   No - Patient declined No - Patient declined No - Patient declined    Current Medications (verified) Outpatient Encounter Medications as of 01/12/2023  Medication Sig   amLODipine (NORVASC) 5 MG tablet Take 1 tablet (5 mg total) by mouth daily.   baclofen (LIORESAL) 10 MG tablet Take 1 tablet (10 mg total) by mouth 2  (two) times daily.   brimonidine (ALPHAGAN) 0.2 % ophthalmic solution Place 1 drop into the left eye 3 (three) times daily.   carboxymethylcellulose (REFRESH PLUS) 0.5 % SOLN 1 drop 3 (three) times daily as needed.   COMBIGAN 0.2-0.5 % ophthalmic solution    dorzolamide-timolol (COSOPT) 2-0.5 % ophthalmic solution Place 1 drop into the left eye 2 (two) times daily.   levothyroxine (SYNTHROID, LEVOTHROID) 88 MCG tablet Take 88 mcg daily before breakfast by mouth.   meloxicam (MOBIC) 7.5 MG tablet Take 1 tablet (7.5 mg total) by mouth daily.   PREDNISOLONE ACETATE P-F 1 % ophthalmic suspension Place 1 drop into the left eye 3 (three) times daily.   No facility-administered encounter medications on file as of 01/12/2023.    Allergies (verified) Latex   History: Past Medical History:  Diagnosis Date   Basal cell carcinoma 03/19/2022   right pretibia, [facility] 04/15/2022   Breast cancer (HCC) 2002   right lumpectomy with radiation   Cancer [facility]) 2002   breast   Hyperlipidemia    Personal history of radiation therapy 2002   Right breast   Shingles of eyelid    Thyroid disease 09/2019   Pt had tumors treated with radiation therapy   Past Surgical History:  Procedure Laterality Date   BREAST BIOPSY Right 2002   breast ca radation   BREAST SURGERY Right 2002   lumpectomy  COLONOSCOPY WITH PROPOFOL N/A 05/06/2017   Procedure: COLONOSCOPY WITH PROPOFOL;  Surgeon: Wyline Mood, MD;  Location: [facility] END[facility]COPY;  Service: Gastroenterology;  Laterality: N/A;   Family History  Problem Relation Age of Onset   Cancer Father        lung cancer   Cancer Maternal Uncle    Cancer Paternal Aunt        Breast   Cancer Paternal Uncle        Kidney   Cancer Paternal Uncle        Lung   Heart attack Mother    Cancer Paternal Grandmother        Breast   Breast cancer Maternal Aunt 45   Breast cancer Maternal Grandmother 6   Social History   Socioeconomic History   Marital status: Widowed     Spouse name: Not on file   Number of children: 2   Years of education: Not on file   Highest education level: 12th grade  Occupational History   Occupation: retired  Tobacco Use   Smoking status: Never   Smokeless tobacco: Never  Vaping Use   Vaping Use: Never used  Substance and Sexual Activity   Alcohol use: No    Alcohol/week: 0.0 standard drinks of alcohol   Drug use: No   Sexual activity: Not Currently  Other Topics Concern   Not on file  Social History Narrative   Not on file   Social Determinants of Health   Financial Resource Strain: Low Risk  (01/12/2023)   Overall Financial Resource Strain (CARDIA)    Difficulty of Paying Living Expenses: Not hard at all  Food Insecurity: No Food Insecurity (01/12/2023)   Hunger Vital Sign    Worried About Running Out of Food in the Last Year: Never true    Ran Out of Food in the Last Year: Never true  Transportation Needs: No Transportation Needs (01/12/2023)   PRAPARE - Administrator, Civil Service (Medical): No    Lack of Transportation (Non-Medical): No  Physical Activity: Inactive (01/12/2023)   Exercise Vital Sign    Days of Exercise per Week: 0 days    Minutes of Exercise per Session: 0 min  Stress: No Stress Concern Present (01/12/2023)   [employer] of Occupational Health - Occupational Stress Questionnaire    Feeling of Stress : Only a little  Social Connections: Moderately Isolated (01/12/2023)   Social Connection and Isolation Panel [NHANES]    Frequency of Communication with Friends and Family: More than three times a week    Frequency of Social Gatherings with Friends and Family: More than three times a week    Attends Religious Services: More than 4 times per year    Active Member of [employer] or Organizations: No    Attends Banker Meetings: Never    Marital Status: Widowed    Tobacco Counseling Counseling given: Not Answered  Clinical Intake:  Pre-visit preparation completed:  Yes  Pain : 0-10 Pain Score: 4  Pain Type: Acute pain Pain Location: Eye Pain Orientation: Left Pain Descriptors / Indicators: Burning Pain Onset: 1 to 4 weeks ago (eye surgery 4 weeks) Pain Frequency: Intermittent Pain Relieving Factors: eye drops  Pain Relieving Factors: eye drops  BMI - recorded: 22.58 Nutritional Status: BMI of 19-24  Normal Nutritional Risks: None Diabetes: No  How often do you need to have someone help you when you read instructions, pamphlets, or other written materials from your doctor or pharmacy?: 1 -  Never  Diabetic?NO  Interpreter Needed?: No  Comments: lives alone Information entered by :: B.Narjis Mira,LPN   Activities of Daily Living    01/12/2023    9:10 AM 01/28/2022    9:29 AM  In your present state of health, do you have any difficulty performing the following activities:  Hearing?  1  Vision? 1 1  Difficulty concentrating or making decisions? 0 0  Walking or climbing stairs? 0 0  Dressing or bathing? 0 0  Doing errands, shopping? 0 0  Preparing Food and eating ? N   Using the Toilet? N   In the past six months, have you accidently leaked urine? Y   Do you have problems with loss of bowel control? N   Managing your Medications? N   Managing your Finances? N   Housekeeping or managing your Housekeeping? N     Patient Care Team: Ronnald Ramp, MD as PCP - General (Family Medicine) Patrecia Pace, Delsa Sale, MD as Attending Physician (Endocrinology) Nadara Mustard, MD as Referring Physician (Obstetrics and Gynecology) Lockie Mola, MD as Referring Physician (Ophthalmology)  Indicate any recent Medical Services you may have received from other than [facility] providers in the past year (date may be approximate).     Assessment:   This is a routine wellness examination for [place].  Hearing/Vision screen Hearing Screening - Comments:: Adequate hearing Vision Screening - Comments:: Inadequate vision in Left  eye [facility]- Dr Inez Pilgrim  Dietary issues and exercise activities discussed: Current Exercise Habits: The patient does not participate in regular exercise at present   Goals Addressed             This Visit's Progress    DIET - EAT MORE FRUITS AND VEGETABLES   On track    DIET - INCREASE WATER INTAKE   On track    Recommend increasing water intake to 3 glasses a day.        Depression Screen    01/12/2023    9:08 AM 10/29/2022    2:56 PM 01/28/2022    9:28 AM 01/07/2022    9:05 AM 10/22/2021    8:45 AM 07/25/2021    9:36 AM 10/25/2020    9:09 AM  PHQ 2/9 Scores  PHQ - 2 Score 0 0 0 1 0 0 2  PHQ- 9 Score   2  2 6 6     Fall Risk    01/12/2023    9:05 AM 01/28/2022    9:28 AM 01/07/2022    9:09 AM 10/22/2021    8:45 AM 07/25/2021    9:35 AM  Fall Risk   Falls in the past year? 1 0 0 0 1  Number falls in past yr: 0 0 0 0 0  Injury with Fall? 1 0 0 0 0  Risk for fall due to : Impaired vision No Fall Risks;Impaired balance/gait No Fall Risks Impaired balance/gait History of fall(s)  Follow up Education provided;Falls prevention discussed Falls evaluation completed Falls evaluation completed Falls evaluation completed Follow up appointment    FALL RISK PREVENTION PERTAINING TO THE HOME:  Any stairs in or around the home? Yes  If so, are there any without handrails? Yes  Home free of loose throw rugs in walkways, pet beds, electrical cords, etc? Yes  Adequate lighting in your home to reduce risk of falls? Yes   ASSISTIVE DEVICES UTILIZED TO PREVENT FALLS:  Life alert? No  Use of a cane, walker or w/c? No  Grab bars in the  bathroom? Yes  Shower chair or bench in shower? Yes  Elevated toilet seat or a handicapped toilet? Yes   Cognitive Function:        01/12/2023    9:12 AM  6CIT Screen  What Year? 0 points  What month? 0 points  What time? 0 points  Count back from 20 0 points  Months in reverse 0 points  Repeat phrase 0 points  Total Score 0 points     Immunizations Immunization History  Administered Date(s) Administered   PFIZER(Purple Top)SARS-COV-2 Vaccination 11/09/2019, 11/30/2019    TDAP status: Up to date  Flu Vaccine status: Declined, Education has been provided regarding the importance of this vaccine but patient still declined. Advised may receive this vaccine at local pharmacy or Health Dept. Aware to provide a copy of the vaccination record if obtained from local pharmacy or Health Dept. Verbalized acceptance and understanding.  Pneumococcal vaccine status: Declined,  Education has been provided regarding the importance of this vaccine but patient still declined. Advised may receive this vaccine at local pharmacy or Health Dept. Aware to provide a copy of the vaccination record if obtained from local pharmacy or Health Dept. Verbalized acceptance and understanding.   Covid-19 vaccine status: Completed vaccines  Qualifies for Shingles Vaccine? Yes   Zostavax completed No   Shingrix Completed?: No.    Education has been provided regarding the importance of this vaccine. Patient has been advised to call insurance company to determine out of pocket expense if they have not yet received this vaccine. Advised may also receive vaccine at local pharmacy or Health Dept. Verbalized acceptance and understanding.  Screening Tests Health Maintenance  Topic Date Due   DTaP/Tdap/Td (1 - Tdap) Never done   Zoster Vaccines- Shingrix (1 of 2) Never done   Pneumonia Vaccine 78+ Years old (1 of 1 - PCV) Never done   COVID-19 Vaccine (3 - Pfizer risk series) 12/28/2019   DEXA SCAN  09/17/2023 (Originally 01/01/2011)   INFLUENZA VACCINE  04/09/2023   Medicare Annual Wellness (AWV)  01/12/2024   HPV VACCINES  Aged Out    Health Maintenance  Health Maintenance Due  Topic Date Due   DTaP/Tdap/Td (1 - Tdap) Never done   Zoster Vaccines- Shingrix (1 of 2) Never done   Pneumonia Vaccine 4+ Years old (1 of 1 - PCV) Never done   COVID-19  Vaccine (3 - Pfizer risk series) 12/28/2019    Colorectal cancer screening: No longer required.   Mammogram status: No longer required due to age.  Bone Density-  Lung Cancer Screening: (Low Dose CT Chest recommended if Age 65-80 years, 30 [place]-year currently smoking OR have quit w/in 15years.) does not qualify.   Lung Cancer Screening Referral: no  Additional Screening:  Hepatitis C Screening: does not qualify; Completed yes  Vision Screening: Recommended annual ophthalmology exams for early detection of glaucoma and other disorders of the eye. Is the patient up to date with their annual eye exam?  Yes  Who is the provider or what is the name of the office in which the patient attends annual eye exams? Dr Inez Pilgrim If pt is not established with a provider, would they like to be referred to a provider to establish care? No .   Dental Screening: Recommended annual dental exams for proper oral hygiene  Community Resource Referral / Chronic Care Management: CRR required this visit?  No   CCM required this visit?  No     Plan:  I have personally reviewed and noted the following in the patient's chart:   Medical and social history Use of alcohol, tobacco or illicit drugs  Current medications and supplements including opioid prescriptions. Patient is not currently taking opioid prescriptions. Functional ability and status Nutritional status Physical activity Advanced directives List of other physicians Hospitalizations, surgeries, and ER visits in previous 12 months Vitals Screenings to include cognitive, depression, and falls Referrals and appointments  In addition, I have reviewed and discussed with patient certain preventive protocols, quality metrics, and best practice recommendations. A written personalized care plan for preventive services as well as general preventive health recommendations were provided to patient.    Sue Lush, LPN   04/09/9561    Nurse Notes: pt says she has had one incident after another since Christmas: a fall, accident and surgery. Pt states she is doing well otherwise. She has no concerns or questions at this time.

## 2023-02-12 NOTE — Progress Notes (Signed)
    GYNECOLOGY PROGRESS NOTE  Subjective:    Patient ID: Renee Powell, female    DOB: [birth year], 89 y.o.   MRN: [id number]  HPI  Patient is a 89 y.o. G2P2 female who presents for pessary maintenance. She has a Baden-Walker grade 4 cystocele and uterine prolapse. Previously seen by Dr. Nicholaus Bloom. She has no new concerns about her pessary today.  She reports no vaginal bleeding or discharge. She denies pelvic discomfort and difficulty urinating or moving her bowels. ?  The following portions of the patient's history were reviewed and updated as appropriate:   She  has a past medical history of Basal cell carcinoma (03/19/2022), Breast cancer (HCC) (2002), Cancer (HCC) (2002), Hyperlipidemia, Personal history of radiation therapy (2002), Shingles of eyelid, and Thyroid disease (09/2019).  She  has a past surgical history that includes Breast surgery (Right, 2002); Colonoscopy with propofol (N/A, 05/06/2017); and Breast biopsy (Right, 2002).  She  reports that she has never smoked. She has never used smokeless tobacco. She reports that she does not drink alcohol and does not use drugs.  Current Outpatient Medications on File Prior to Visit  Medication Sig Dispense Refill   amLODipine (NORVASC) 5 MG tablet Take 1 tablet (5 mg total) by mouth daily. 90 tablet 1   baclofen (LIORESAL) 10 MG tablet Take 1 tablet (10 mg total) by mouth 2 (two) times daily. 30 each 0   brimonidine (ALPHAGAN) 0.2 % ophthalmic solution Place 1 drop into the left eye 3 (three) times daily.     carboxymethylcellulose (REFRESH PLUS) 0.5 % SOLN 1 drop 3 (three) times daily as needed.     dorzolamide-timolol (COSOPT) 2-0.5 % ophthalmic solution Place 1 drop into the left eye 2 (two) times daily.     levothyroxine (SYNTHROID, LEVOTHROID) 88 MCG tablet Take 88 mcg daily before breakfast by mouth.     PREDNISOLONE ACETATE P-F 1 % ophthalmic suspension Place 1 drop into the left eye 3 (three) times daily.     No current  facility-administered medications on file prior to visit.   She is allergic to latex..  Review of Systems Pertinent items are noted in HPI.   Objective:   Blood pressure 129/64, pulse 63, resp. rate 16, height 5\' 5"  (1.651 m), weight 139 lb (63 kg).  Body mass index is 23.13 kg/m. General appearance: alert, cooperative, and no distress Abdomen: soft, non-tender; bowel sounds normal; no masses,  no organomegaly Pelvic: External genitalia normal, mildly atrophic. The patient's Size #5 ring with support pessary was removed, cleaned and replaced without complications. Speculum examination revealed normal vaginal mucosa with no lesions or lacerations.    Assessment:   1. Pessary maintenance   2. Baden-Walker grade 4 cystocele   3. Uterine prolapse      Plan:   The patient should return in 3-4 months for a pessary check and continue to use vaginal estrogen cream or Trimo-san gel for maintenance as needed.     Hildred Laser, MD [facility]

## 2023-02-17 ENCOUNTER — Ambulatory Visit (INDEPENDENT_AMBULATORY_CARE_PROVIDER_SITE_OTHER): Payer: Medicare Other | Admitting: Obstetrics and Gynecology

## 2023-02-17 ENCOUNTER — Encounter: Payer: Self-pay | Admitting: Obstetrics and Gynecology

## 2023-02-17 VITALS — BP 129/64 | HR 63 | Resp 16 | Ht 65.0 in | Wt 139.0 lb

## 2023-02-17 DIAGNOSIS — Z4689 Encounter for fitting and adjustment of other specified devices: Secondary | ICD-10-CM | POA: Diagnosis not present

## 2023-02-17 DIAGNOSIS — N8110 Cystocele, unspecified: Secondary | ICD-10-CM

## 2023-02-17 DIAGNOSIS — N814 Uterovaginal prolapse, unspecified: Secondary | ICD-10-CM | POA: Diagnosis not present

## 2023-03-18 ENCOUNTER — Ambulatory Visit (INDEPENDENT_AMBULATORY_CARE_PROVIDER_SITE_OTHER): Payer: Medicare Other | Admitting: Dermatology

## 2023-03-18 ENCOUNTER — Encounter: Payer: Self-pay | Admitting: Dermatology

## 2023-03-18 VITALS — BP 125/66

## 2023-03-18 DIAGNOSIS — L821 Other seborrheic keratosis: Secondary | ICD-10-CM

## 2023-03-18 DIAGNOSIS — L720 Epidermal cyst: Secondary | ICD-10-CM

## 2023-03-18 DIAGNOSIS — Z79899 Other long term (current) drug therapy: Secondary | ICD-10-CM

## 2023-03-18 DIAGNOSIS — D492 Neoplasm of unspecified behavior of bone, soft tissue, and skin: Secondary | ICD-10-CM

## 2023-03-18 DIAGNOSIS — D044 Carcinoma in situ of skin of scalp and neck: Secondary | ICD-10-CM

## 2023-03-18 DIAGNOSIS — W908XXA Exposure to other nonionizing radiation, initial encounter: Secondary | ICD-10-CM

## 2023-03-18 DIAGNOSIS — L578 Other skin changes due to chronic exposure to nonionizing radiation: Secondary | ICD-10-CM

## 2023-03-18 DIAGNOSIS — Z7189 Other specified counseling: Secondary | ICD-10-CM

## 2023-03-18 DIAGNOSIS — C4492 Squamous cell carcinoma of skin, unspecified: Secondary | ICD-10-CM

## 2023-03-18 DIAGNOSIS — L729 Follicular cyst of the skin and subcutaneous tissue, unspecified: Secondary | ICD-10-CM

## 2023-03-18 HISTORY — DX: Squamous cell carcinoma of skin, unspecified: C44.92

## 2023-03-18 NOTE — Progress Notes (Signed)
Follow-Up Visit   Subjective  Renee Powell is a 89 y.o. female who presents for the following: check spot, L neck, 64m, burns The patient has spots, moles and lesions to be evaluated, some may be new or changing and the patient may have concern these could be cancer.  The following portions of the chart were reviewed this encounter and updated as appropriate: medications, allergies, medical history  Review of Systems:  No other skin or systemic complaints except as noted in HPI or Assessment and Plan.  Objective  Well appearing patient in no apparent distress; mood and affect are within normal limits. A focused examination was performed of the following areas: Left neck Relevant exam findings are noted in the Assessment and Plan.  L lat neck Cutaneous horn 1.2cm       Assessment & Plan   Neoplasm of skin L lat neck  Epidermal / dermal shaving  Lesion diameter (cm):  1.2 Informed consent: discussed and consent obtained   Timeout: patient name, date of birth, surgical site, and procedure verified   Procedure prep:  Patient was prepped and draped in usual sterile fashion Prep type:  Isopropyl alcohol Anesthesia: the lesion was anesthetized in a standard fashion   Anesthetic:  1% lidocaine w/ epinephrine 1-100,000 buffered w/ 8.4% NaHCO3 Instrument used: flexible razor blade   Hemostasis achieved with: pressure, aluminum chloride and electrodesiccation   Outcome: patient tolerated procedure well   Post-procedure details: sterile dressing applied and wound care instructions given   Dressing type: bandage and bacitracin    Destruction of lesion Complexity: extensive   Destruction method: electrodesiccation and curettage   Informed consent: discussed and consent obtained   Timeout:  patient name, date of birth, surgical site, and procedure verified Procedure prep:  Patient was prepped and draped in usual sterile fashion Prep type:  Isopropyl alcohol Anesthesia: the  lesion was anesthetized in a standard fashion   Anesthetic:  1% lidocaine w/ epinephrine 1-100,000 buffered w/ 8.4% NaHCO3 Curettage performed in three different directions: Yes   Electrodesiccation performed over the curetted area: Yes   Lesion length (cm):  1.2 Lesion width (cm):  1.2 Margin per side (cm):  0.2 Final wound size (cm):  1.6 Hemostasis achieved with:  pressure, aluminum chloride and electrodesiccation Outcome: patient tolerated procedure well with no complications   Post-procedure details: sterile dressing applied and wound care instructions given   Dressing type: bandage and bacitracin    Specimen 1 - Surgical pathology Differential Diagnosis: D48.5 R/O SCc  Check Margins: No Cutaneous horn 1.2cm EDC today  Cyst of skin  Medication management  Counseling and coordination of care  Actinic skin damage  Seborrheic keratosis  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 - Observe - Call for any changes  ACTINIC DAMAGE - chronic, secondary to cumulative UV radiation exposure/sun exposure over time - diffuse scaly erythematous macules with underlying dyspigmentation - Recommend daily broad spectrum sunscreen SPF 30+ to sun-exposed areas, reapply every 2 hours as needed.  - Recommend staying in the shade or wearing long sleeves, sun glasses (UVA+UVB protection) and wide brim hats (4-inch brim around the entire  circumference of the hat). - Call for new or changing lesions.   Return in about 7 months (around 10/19/2023) for TBSE, hx of BCC, hx of AKS, recheck bx site.  I, Ardis Rowan, RMA, am acting as scribe for Armida Sans,  MD .  Documentation: I have reviewed the above documentation for accuracy and completeness, and I agree with the above.  Armida Sans, MD

## 2023-03-18 NOTE — Patient Instructions (Addendum)
Wound Care Instructions  Cleanse wound gently with soap and water once a day then pat dry with clean gauze. Apply a thin coat of Petrolatum (petroleum jelly, "Vaseline") over the wound (unless you have an allergy to this). We recommend that you use a new, sterile tube of Vaseline. Do not pick or remove scabs. Do not remove the yellow or white "healing tissue" from the base of the wound.  Cover the wound with fresh, clean, nonstick gauze and secure with paper tape. You may use Band-Aids in place of gauze and tape if the wound is small enough, but would recommend trimming much of the tape off as there is often too much. Sometimes Band-Aids can irritate the skin.  You should call the office for your biopsy report after 1 week if you have not already been contacted.  If you experience any problems, such as abnormal amounts of bleeding, swelling, significant bruising, significant pain, or evidence of infection, please call the office immediately.  FOR ADULT SURGERY PATIENTS: If you need something for pain relief you may take 1 extra strength Tylenol (acetaminophen) AND 2 Ibuprofen (200mg  each) together every 4 hours as needed for pain. (do not take these if you are allergic to them or if you have a reason you should not take them.) Typically, you may only need pain medication for 1 to 3 days.   AKLIEF cream Apply to white bumps on face nightly, very small amount   Due to recent changes in healthcare laws, you may see results of your pathology and/or laboratory studies on MyChart before the doctors have had a chance to review them. We understand that in some cases there may be results that are confusing or concerning to you. Please understand that not all results are received at the same time and often the doctors may need to interpret multiple results in order to provide you with the best plan of care or course of treatment. Therefore, we ask that you please give [place] 2 business days to thoroughly review  all your results before contacting the office for clarification. Should we see a critical lab result, you will be contacted sooner.   If You Need Anything After Your Visit  If you have any questions or concerns for your doctor, please call our main line at [phone] and press option 4 to reach your doctor's medical assistant. If no one answers, please leave a voicemail as directed and we will return your call as soon as possible. Messages left after 4 pm will be answered the following business day.   You may also send [place] a message via MyChart. We typically respond to MyChart messages within 1-2 business days.  For prescription refills, please ask your pharmacy to contact our office. Our fax number is [phone].  If you have an urgent issue when the clinic is closed that cannot wait until the next business day, you can page your doctor at the number below.    Please note that while we do our best to be available for urgent issues outside of office hours, we are not available 24/7.   If you have an urgent issue and are unable to reach [place], you may choose to seek medical care at your doctor's office, retail clinic, urgent care center, or emergency room.  If you have a medical emergency, please immediately call 911 or go to the emergency department.  Pager Numbers  - Dr. Gwen Pounds: [phone]  - Dr. Neale Burly: [phone]  - Dr. Roseanne Reno: [phone]  In the event of inclement weather, please call our main line at [phone] for an update on the status of any delays or closures.  Dermatology Medication Tips: Please keep the boxes that topical medications come in in order to help keep track of the instructions about where and how to use these. Pharmacies typically print the medication instructions only on the boxes and not directly on the medication tubes.   If your medication is too expensive, please contact our office at [phone] option 4 or send [place] a message through MyChart.    We are unable to tell what your co-pay for medications will be in advance as this is different depending on your insurance coverage. However, we may be able to find a substitute medication at lower cost or fill out paperwork to get insurance to cover a needed medication.   If a prior authorization is required to get your medication covered by your insurance company, please allow [place] 1-2 business days to complete this process.  Drug prices often vary depending on where the prescription is filled and some pharmacies may offer cheaper prices.  The website www.goodrx.com contains coupons for medications through different pharmacies. The prices here do not account for what the cost may be with help from insurance (it may be cheaper with your insurance), but the website can give you the price if you did not use any insurance.  - You can print the associated coupon and take it with your prescription to the pharmacy.  - You may also stop by our office during regular business hours and pick up a GoodRx coupon card.  - If you need your prescription sent electronically to a different pharmacy, notify our office through [facility] or by phone at [phone] option 4.     Si Usted Necesita Algo Despus de Su Visita  Tambin puede enviarnos un mensaje a travs de Clinical cytogeneticist. Por lo general respondemos a los mensajes de MyChart en el transcurso de 1 a 2 das hbiles.  Para renovar recetas, por favor pida a su farmacia que se ponga en contacto con nuestra oficina. Annie Sable de fax es [place] [phone].  Si tiene un asunto urgente cuando la clnica est cerrada y que no puede esperar hasta el siguiente da hbil, puede llamar/localizar a su doctor(a) al nmero que aparece a continuacin.   Por favor, tenga en cuenta que aunque hacemos todo lo posible para estar disponibles para asuntos urgentes fuera del horario de [place], no estamos disponibles las 24 horas del da, los 7 [address]  [address] de la [place].    Si tiene un problema urgente y no puede comunicarse con nosotros, puede optar por buscar atencin mdica  en el consultorio de su doctor(a), en una clnica privada, en un centro de atencin urgente o en una sala de emergencias.  Si tiene Engineer, drilling, por favor llame inmediatamente al 911 o vaya a la sala de emergencias.  Nmeros de bper  - Dr. Gwen Pounds: [phone]  - Dra. Moye: [phone]  - Dra. Roseanne Reno: [phone]  En caso de inclemencias del [place], por favor llame a Lacy Duverney principal al [phone] para una actualizacin sobre el [place] de cualquier retraso o cierre.  Consejos para la medicacin en dermatologa: Por favor, guarde las cajas en las que vienen los medicamentos de uso tpico para ayudarle a seguir las instrucciones sobre dnde y cmo usarlos. Las farmacias generalmente imprimen las instrucciones del medicamento slo en las cajas y no directamente en los tubos del [place].  Si su medicamento es muy caro, por favor, pngase en contacto con Rolm Gala llamando al [phone] y presione la opcin 4 o envenos un mensaje a travs de Clinical cytogeneticist.   No podemos decirle cul ser su copago por los medicamentos por adelantado ya que esto es diferente dependiendo de la cobertura de su seguro. Sin embargo, es posible que podamos encontrar un medicamento sustituto a Audiological scientist un formulario para que el seguro cubra el medicamento que se considera necesario.   Si se requiere una autorizacin previa para que su compaa de seguros [place] su medicamento, por favor permtanos de 1 a 2 das hbiles para completar [address]  Los precios de los medicamentos varan con frecuencia dependiendo del Environmental consultant de dnde se surte la receta y alguna farmacias pueden ofrecer precios ms baratos.  El sitio web www.goodrx.com tiene cupones para medicamentos de Health and safety inspector. Los precios aqu no tienen en cuenta lo que podra costar con la ayuda del seguro  (puede ser ms barato con su seguro), pero el sitio web puede darle el precio si no utiliz Tourist information centre manager.  - Puede imprimir el cupn correspondiente y llevarlo con su receta a la farmacia.  - Tambin puede pasar por nuestra oficina durante el horario de atencin regular y Education officer, museum una tarjeta de cupones de GoodRx.  - Si necesita que su receta se enve electrnicamente a una farmacia diferente, informe a nuestra oficina a travs de MyChart de [place] o por telfono llamando al [phone] y presione la opcin 4.

## 2023-03-20 ENCOUNTER — Encounter: Payer: Self-pay | Admitting: Dermatology

## 2023-03-23 ENCOUNTER — Other Ambulatory Visit: Payer: Self-pay

## 2023-03-23 ENCOUNTER — Other Ambulatory Visit: Payer: Self-pay | Admitting: Family Medicine

## 2023-03-23 ENCOUNTER — Telehealth: Payer: Self-pay | Admitting: Family Medicine

## 2023-03-23 MED ORDER — AMLODIPINE BESYLATE 5 MG PO TABS: 5.0000 mg | ORAL_TABLET | Freq: Every day | ORAL | 0 refills | Status: DC

## 2023-03-23 NOTE — Telephone Encounter (Signed)
[facility] amLODipine (NORVASC) 5 MG tablet   Please advise

## 2023-03-24 ENCOUNTER — Encounter: Payer: Self-pay | Admitting: Family Medicine

## 2023-03-24 ENCOUNTER — Ambulatory Visit
Admission: RE | Admit: 2023-03-24 | Discharge: 2023-03-24 | Disposition: A | Discharge status: Home / Self Care | Payer: Medicare Other | Attending: Family Medicine | Admitting: Family Medicine

## 2023-03-24 ENCOUNTER — Ambulatory Visit: Payer: Medicare Other | Admitting: Family Medicine

## 2023-03-24 ENCOUNTER — Ambulatory Visit: Admission: RE | Admit: 2023-03-24 | Payer: Medicare Other | Source: Ambulatory Visit

## 2023-03-24 VITALS — BP 130/62 | HR 75 | Temp 98.0°F | Resp 12 | Ht 65.0 in | Wt 138.0 lb

## 2023-03-24 DIAGNOSIS — R600 Localized edema: Secondary | ICD-10-CM | POA: Insufficient documentation

## 2023-03-24 DIAGNOSIS — I10 Essential (primary) hypertension: Secondary | ICD-10-CM

## 2023-03-24 MED ORDER — HYDROCHLOROTHIAZIDE 12.5 MG PO CAPS
12.5000 mg | ORAL_CAPSULE | Freq: Every day | ORAL | 1 refills | Status: DC
Start: 2023-03-24 — End: 2023-05-14

## 2023-03-24 NOTE — Patient Instructions (Addendum)
Please report to [facility] located at:  [address]  [place], [place] [address]  You do not need an appointment to have xrays completed.   Our office will follow up with  results once available.    We will follow up with results of labs once they are available.     Please try to keep your foot elevated at the level of your heart to help reduce the swelling    VISIT SUMMARY:  During your visit, we discussed the sudden swelling in your left foot that occurred after a fall. You also mentioned frequent urination, especially at night. We considered your history of hypertension, cancer, and eye problems, as well as your upcoming vacation plans.  YOUR PLAN:  -LEFT FOOT SWELLING: The swelling in your left foot could be due to an injury, a fracture, poor blood flow, or issues with your kidneys, liver, or heart. We will take an X-ray of your foot and ankle to check for fractures and run some tests to check your kidney and liver function. In the meantime, try to keep your foot elevated to help reduce the swelling. We will also stop your blood pressure medication, Amlodipine, for 3 days to see if it's contributing to the swelling. If it is, we will start you on a new medication, Microzide, which may help remove excess fluid.  INSTRUCTIONS:  Please follow the instructions for the tests and medication changes. We will reassess your swelling and blood pressure in the first week of August. If you have any concerns or if the swelling does not improve, please contact the clinic immediately.

## 2023-03-24 NOTE — Assessment & Plan Note (Signed)
Chronic  BP within goal range  Will have her hold amlodipine 5mg  daily due to potential contribution to swelling  Will order CMP today  Will prescribe hydrochlorothiazide 12.5mg  daily to replace amlodipine

## 2023-03-24 NOTE — Assessment & Plan Note (Signed)
Acute onset following a fall. No pain, redness, or heat. 2+ pitting edema. Differential includes soft tissue injury, fracture, venous insufficiency, or systemic causes (renal, hepatic, cardiac). -Order X-ray of left foot and ankle to rule out fracture. -Order labs to assess renal and hepatic function. -Advise patient to elevate the foot to help reduce swelling. -Hold Amlodipine for 3 days to assess if it is contributing to the swelling. -Prescribe Microzide 12.5mg  to potentially help with fluid removal if Amlodipine is the cause of swelling.
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
## 2023-03-25 ENCOUNTER — Encounter: Payer: Self-pay | Admitting: Family Medicine

## 2023-03-25 LAB — COMPREHENSIVE METABOLIC PANEL
ALT: 9 IU/L (ref 0–32)
AST: 15 IU/L (ref 0–40)
Albumin: 4 g/dL (ref 3.7–4.7)
Alkaline Phosphatase: 102 IU/L (ref 44–121)
BUN/Creatinine Ratio: 21 (ref 12–28)
BUN: 18 mg/dL (ref 8–27)
Bilirubin Total: 0.7 mg/dL (ref 0.0–1.2)
CO2: 25 mmol/L (ref 20–29)
Calcium: 9.7 mg/dL (ref 8.7–10.3)
Chloride: 105 mmol/L (ref 96–106)
Creatinine, Ser: 0.87 mg/dL (ref 0.57–1.00)
Globulin, Total: 2 g/dL (ref 1.5–4.5)
Glucose: 90 mg/dL (ref 70–99)
Potassium: 4.4 mmol/L (ref 3.5–5.2)
Sodium: 142 mmol/L (ref 134–144)
Total Protein: 6 g/dL (ref 6.0–8.5)
eGFR: 64 mL/min/{1.73_m2} (ref >59)

## 2023-03-26 ENCOUNTER — Telehealth: Payer: Self-pay

## 2023-03-26 NOTE — Telephone Encounter (Signed)
-----   Message from Willeen Niece sent at 03/26/2023 11:29 AM EDT ----- Skin , left lat neck SQUAMOUS CELL CARCINOMA IN SITU, HYPERTROPHIC, BASE INVOLVED  SCCIS skin cancer- already treated with EDC at time of biopsy    - please call patient

## 2023-03-26 NOTE — Telephone Encounter (Signed)
Patient informed of pathology results 

## 2023-03-27 ENCOUNTER — Ambulatory Visit: Payer: Self-pay

## 2023-03-27 ENCOUNTER — Ambulatory Visit: Payer: Self-pay | Admitting: *Deleted

## 2023-03-27 NOTE — Telephone Encounter (Signed)
Chief Complaint: Medication Question  Symptoms: Left foot swelling Disposition: [] ED /[] Urgent Care (no appt availability in office) / [] Appointment(In office/virtual)/ []  [place] [facility]/ [] Home Care/ [] Refused Recommended Disposition /[] [place] [facility]/ [x]  Follow-up with PCP Additional Notes: Patient stated that she was advised to stop taking her blood pressure medication for 3 days due to swelling in the left foot by provider. Today is day 3 and patient states the swelling has gone down but not completely gone yet. Patient wants to know if she can start taking her Amlodipine again tomorrow and if provider wants to prescribe an antibiotic since the swelling is not completely gone. Advised patient that I would forward the message to her provider for recommendations.  If Rx is sent to pharmacy please [facility] location. Summary: Medication questions   Patient called to advise provider that the swelling in her foot is coming down but want to know if she can start taking her blood pressure medication. Patient is also asking if provider wanted to start and antibiotic.     Reason for Disposition  [1] Caller has NON-URGENT medicine question about med that PCP prescribed AND [2] triager unable to answer question  Answer Assessment - Initial Assessment Questions 1. NAME of MEDICINE: "What medicine(s) are you calling about?"     Amlodipine  2. QUESTION: "What is your question?" (e.g., double dose of medicine, side effect)     Patient wanted to know if she can start taking her blood pressure medication again and does she need an antibiotic for the foot? 3. PRESCRIBER: "Who prescribed the medicine?" Reason: if prescribed by specialist, call should be referred to that group.     Dr. Carolyn Stare 4. SYMPTOMS: "Do you have any symptoms?" If Yes, ask: "What symptoms are you having?"  "How bad are the symptoms (e.g., mild, moderate, severe)     Swelling in left foot has  improved but is not completely gone.  Protocols used: Medication Question Call-A-AH

## 2023-03-27 NOTE — Telephone Encounter (Signed)
Advised patient to continue to hold the amlodipine and start the hydrochlorozide, per Dr. Danella Penton note; advised her to continue that until she follows up with Dr. Roxan Hockey next month.  Discussed that they can revisit whether or not to start the amlodipine at that visit.        At her request, I took a look at her x-ray and told her, from my non-radiologist perspective, her it looks like nothing is broken. I informed her we would call her and let her know when the official read from the radiologist came back.  Patient also asked about her blood work; I advised her that it looks good overall, except for her mildly decreased kidney function which is stable relative to the last few years.

## 2023-03-27 NOTE — Telephone Encounter (Signed)
  Chief Complaint: She was told to hold the amlodipine for 3 days and see if the swelling went down in her foot.   Today 7/19 is day 3.   Her foot has gone down but still has some swelling in it.   A different BP medicine was ordered Hydrochlorothiazide in case it was the amlodipine causing the foot swelling.    She needs to know what Dr. Roxan Hockey wants her to do.   Restart the amlodipine or remain off the amlodipine and start the hydrochlorothiazide?   Symptoms: foot swelling has gone down since being off the amlodipine but not all the way. Frequency: N/A Pertinent Negatives: Patient denies all the swelling being gone.  Disposition: [] ED /[] Urgent Care (no appt availability in office) / [] Appointment(In office/virtual)/ []  [place] [facility]/ [] Home Care/ [] Refused Recommended Disposition /[] [place] [facility]/ [x]  Follow-up with PCP Additional Notes: Needing clarification on what to do in regards to her BP medications and the foot swelling.

## 2023-03-27 NOTE — Telephone Encounter (Signed)
Reason for Disposition  [1] Caller has URGENT medicine question about med that PCP or specialist prescribed AND [2] triager unable to answer question  Answer Assessment - Initial Assessment Questions 1. NAME of MEDICINE: "What medicine(s) are you calling about?"     I talked with a nurse earlier.   They took me off my BP medicine because my foot was swollen.   The swelling is going down.     Stop amlodipine for 3 days.  To see if it is responsible for the swelling.   They already filled the medicine she ordered for me in case the amlodipine was responsible for the foot swelling.  2. QUESTION: "What is your question?" (e.g., double dose of medicine, side effect)     What am I supposed to take or not take?   3. PRESCRIBER: "Who prescribed the medicine?" Reason: if prescribed by specialist, call should be referred to that group.     Dr. Neita Garnet 4. SYMPTOMS: "Do you have any symptoms?" If Yes, ask: "What symptoms are you having?"  "How bad are the symptoms (e.g., mild, moderate, severe)     Foot swelling is better.   I can walk on it now.    It's still a little swollen but only a little.N/A 5. PREGNANCY:  "Is there any chance that you are pregnant?" "When was your last menstrual period?"     N/A  Protocols used: Medication Question Call-A-AH

## 2023-04-21 ENCOUNTER — Ambulatory Visit: Payer: Medicare Other | Admitting: Family Medicine

## 2023-05-14 ENCOUNTER — Encounter: Payer: Self-pay | Admitting: Family Medicine

## 2023-05-14 ENCOUNTER — Ambulatory Visit: Payer: Medicare Other | Admitting: Family Medicine

## 2023-05-14 ENCOUNTER — Other Ambulatory Visit: Payer: Self-pay | Admitting: Family Medicine

## 2023-05-14 ENCOUNTER — Telehealth: Payer: Self-pay | Admitting: Family Medicine

## 2023-05-14 DIAGNOSIS — I10 Essential (primary) hypertension: Secondary | ICD-10-CM | POA: Diagnosis not present

## 2023-05-14 MED ORDER — AMLODIPINE BESYLATE 5 MG PO TABS: 5.0000 mg | ORAL_TABLET | Freq: Every day | ORAL | 3 refills | Status: AC

## 2023-05-14 MED ORDER — HYDROCHLOROTHIAZIDE 12.5 MG PO TABS
12.5000 mg | ORAL_TABLET | Freq: Every day | ORAL | 1 refills | Status: DC
Start: 2023-05-14 — End: 2023-05-14

## 2023-05-14 MED ORDER — HYDROCHLOROTHIAZIDE 12.5 MG PO CAPS
12.5000 mg | ORAL_CAPSULE | Freq: Every day | ORAL | 0 refills | Status: DC
Start: 2023-05-14 — End: 2023-05-14

## 2023-05-14 MED ORDER — HYDROCHLOROTHIAZIDE 25 MG PO TABS: 12.5000 mg | ORAL_TABLET | Freq: Every day | ORAL | 0 refills | Status: DC

## 2023-05-14 NOTE — Telephone Encounter (Signed)
Received message requesting clarification on 1 capsule of hydrochlorothiazide prescription from NP Merita Norton 05/14/23  Attempted to submit three new prescriptions to pharmacy to replace the prescription, three error messages received  Pt will need to continue amlodipine and hydrochlorothiazide as directed in OV note from 05/14/23

## 2023-05-14 NOTE — Progress Notes (Signed)
Established patient visit   Patient: Renee Powell   DOB: June 07, 1934   89 y.o. Female  MRN: [id number] Visit Date: 05/14/2023  Today's healthcare provider: Jacky Kindle, FNP  Introduced to nurse practitioner role and practice setting.  All questions answered.  Discussed provider/patient relationship and expectations.  Subjective    Hypertension   Request to stop low dose hydrochlorothiazide and go back to Norvasc 5 mg; has BP cuff at home and reports side effects with hydrochlorothiazide with sleep.  L ankle is stabilized.  Medications: Outpatient Medications Prior to Visit  Medication Sig   brimonidine (ALPHAGAN) 0.2 % ophthalmic solution Place 1 drop into the left eye 3 (three) times daily.   carboxymethylcellulose (REFRESH PLUS) 0.5 % SOLN 1 drop 3 (three) times daily as needed.   dorzolamide-timolol (COSOPT) 2-0.5 % ophthalmic solution Place 1 drop into the left eye 2 (two) times daily.   levothyroxine (SYNTHROID, LEVOTHROID) 88 MCG tablet Take 88 mcg daily before breakfast by mouth.   PREDNISOLONE ACETATE P-F 1 % ophthalmic suspension Place 1 drop into the left eye 3 (three) times daily.   [DISCONTINUED] amLODipine (NORVASC) 5 MG tablet Take 1 tablet (5 mg total) by mouth daily.   [DISCONTINUED] hydrochlorothiazide (MICROZIDE) 12.5 MG capsule Take 1 capsule (12.5 mg total) by mouth daily.   No facility-administered medications prior to visit.    Review of Systems    Objective    BP (!) 150/78 (BP Location: Left Arm, Patient Position: Sitting, Cuff Size: Normal)   Pulse 65   Temp 97.7 F (36.5 C) (Temporal)   Resp 16   Ht 5\' 5"  (1.651 m)   Wt 135 lb 6.4 oz (61.4 kg)   SpO2 97%   BMI 22.53 kg/m   Physical Exam Vitals and nursing note reviewed.  Constitutional:      General: She is not in acute distress.    Appearance: Normal appearance. She is normal weight. She is not ill-appearing, toxic-appearing or diaphoretic.  HENT:     Head: Normocephalic and  atraumatic.  Cardiovascular:     Rate and Rhythm: Normal rate and regular rhythm.     Pulses: Normal pulses.     Heart sounds: Normal heart sounds. No murmur heard.    No friction rub. No gallop.  Pulmonary:     Effort: Pulmonary effort is normal. No respiratory distress.     Breath sounds: Normal breath sounds. No stridor. No wheezing, rhonchi or rales.  Chest:     Chest wall: No tenderness.  Musculoskeletal:        General: No swelling, tenderness, deformity or signs of injury. Normal range of motion.     Right lower leg: No edema.     Left lower leg: No edema.  Skin:    General: Skin is warm and dry.     Capillary Refill: Capillary refill takes less than 2 seconds.     Coloration: Skin is not jaundiced or pale.     Findings: No bruising, erythema, lesion or rash.  Neurological:     General: No focal deficit present.     Mental Status: She is alert and oriented to person, place, and time. Mental status is at baseline.     Cranial Nerves: No cranial nerve deficit.     Sensory: No sensory deficit.     Motor: No weakness.     Coordination: Coordination normal.  Psychiatric:        Mood and Affect: Mood normal.  Behavior: Behavior normal.        Thought Content: Thought content normal.        Judgment: Judgment normal.     No results found for any visits on 05/14/23.  Assessment & Plan     Problem List Items Addressed This Visit       Cardiovascular and Mediastinum   Primary hypertension    Chronic, request to go back on norvasc 5mg ; reports side effects from hydrochlorothiazide 12.5 mg  Encouraged to continue home checks and come back as needed BP elevated on hydrochlorothiazide alone      Relevant Medications   amLODipine (NORVASC) 5 MG tablet   hydrochlorothiazide (MICROZIDE) 12.5 MG capsule   Return if symptoms worsen or fail to improve.     Leilani Merl, FNP, have reviewed all documentation for this visit. The documentation on 05/14/23 for the exam,  diagnosis, procedures, and orders are all accurate and complete.  Jacky Kindle, FNP  [facility] [phone] (phone) [phone] (fax)  [facility]

## 2023-05-14 NOTE — Assessment & Plan Note (Signed)
Chronic, request to go back on norvasc 5mg ; reports side effects from hydrochlorothiazide 12.5 mg  Encouraged to continue home checks and come back as needed BP elevated on hydrochlorothiazide alone

## 2023-05-26 ENCOUNTER — Encounter: Payer: Self-pay | Admitting: Obstetrics & Gynecology

## 2023-05-26 ENCOUNTER — Ambulatory Visit (INDEPENDENT_AMBULATORY_CARE_PROVIDER_SITE_OTHER): Payer: Medicare Other | Admitting: Obstetrics & Gynecology

## 2023-05-26 VITALS — BP 164/58 | Ht 65.0 in | Wt 136.0 lb

## 2023-05-26 DIAGNOSIS — B0239 Other herpes zoster eye disease: Secondary | ICD-10-CM

## 2023-05-26 DIAGNOSIS — Z4689 Encounter for fitting and adjustment of other specified devices: Secondary | ICD-10-CM | POA: Diagnosis not present

## 2023-05-26 DIAGNOSIS — N814 Uterovaginal prolapse, unspecified: Secondary | ICD-10-CM

## 2023-05-26 DIAGNOSIS — N8110 Cystocele, unspecified: Secondary | ICD-10-CM

## 2023-05-26 MED ORDER — VALACYCLOVIR HCL 1 G PO TABS: 1000.0000 mg | ORAL_TABLET | Freq: Every day | ORAL | 12 refills | Status: DC

## 2023-05-26 NOTE — Progress Notes (Signed)
GYNECOLOGY PROGRESS NOTE  Subjective:    Patient ID: Renee Powell, female    DOB: 12/28/1933, 89 y.o.   MRN: [id number]  HPI  Patient is a 89 y.o. G2P2 here for pessary maintenance. She is very happy with her current pessary, having no issues with bowel or bladder function. She denies any bleeding.  She reports long term shingles infection in her eyes and eye pain daily. She used valtrex when it was initially diagnosed but hasn't tried it lately.  The following portions of the patient's history were reviewed and updated as appropriate: allergies, current medications, past family history, past medical history, past social history, past surgical history, and problem list.  Review of Systems Pertinent items are noted in HPI.   Objective:   Blood pressure (!) 160/62, height 5\' 5"  (1.651 m), weight 136 lb (61.7 kg). Body mass index is 22.63 kg/m. Well nourished, well hydrated White female, no apparent distress She is ambulating and conversing normally. She is ambulating and conversing normally. I removed the current pessary and cleaned it. I inspected her vagina with a speculum and noted only healthy vaginal mucosa.    Extremities: extremities normal, atraumatic, no cyanosis or edema Neurologic: Grossly normal  Assessment:   1. Baden-Walker grade 4 cystocele   2. Uterine prolapse   3. Pessary maintenance      Plan:   1. Baden-Walker grade 4 cystocele   2. Uterine prolapse   3. Pessary maintenance - she will come every 3 months/prn sooner

## 2023-05-28 ENCOUNTER — Ambulatory Visit: Payer: Medicare Other | Admitting: Obstetrics and Gynecology

## 2023-06-02 ENCOUNTER — Ambulatory Visit: Payer: Medicare Other | Admitting: Obstetrics and Gynecology

## 2023-06-08 ENCOUNTER — Telehealth: Payer: Self-pay

## 2023-06-08 ENCOUNTER — Telehealth: Payer: Self-pay | Admitting: Family Medicine

## 2023-06-08 NOTE — Transitions of Care (Post Inpatient/ED Visit) (Signed)
06/08/2023  Name: Renee Powell MRN: [id number] DOB: 07/17/1934  Today's TOC FU Call Status: Today's TOC FU Call Status:: Successful TOC FU Call Completed TOC FU Call Complete Date: 06/08/23 Patient's Name and Date of Birth confirmed.  Transition Care Management Follow-up Telephone Call Date of Discharge: 06/06/23 Discharge Facility: Other [facility]) Name of Other ([facility]) Discharge Facility: [facility] Type of Discharge: Inpatient Admission Primary Inpatient Discharge Diagnosis:: intestional obstruction How have you been since you were released from the hospital?: Better Any questions or concerns?: No  Items Reviewed: Did you receive and understand the discharge instructions provided?: Yes Medications obtained,verified, and reconciled?: Yes (Medications Reviewed) Any new allergies since your discharge?: No Dietary orders reviewed?: Yes Do you have support at home?: Yes People in Home: child(ren), adult  Medications Reviewed Today: Medications Reviewed Today     Reviewed by Karena Addison, LPN (Licensed Practical Nurse) on 06/08/23 at [phone]  Med List Status: <None>   Medication Order Taking? Sig Documenting Provider Last Dose Status Informant  amLODipine (NORVASC) 5 MG tablet [id number] Yes Take 1 tablet (5 mg total) by mouth daily. Jacky Kindle, FNP Taking Active   apixaban (ELIQUIS) 5 MG TABS tablet [id number] Yes Take 5 mg by mouth 2 (two) times daily. [provider] Taking Active   brimonidine (ALPHAGAN) 0.2 % ophthalmic solution [id number] Yes Place 1 drop into the left eye 3 (three) times daily. [provider] Taking Active   carboxymethylcellulose (REFRESH PLUS) 0.5 % SOLN [id number] Yes 1 drop 3 (three) times daily as needed. [provider] Taking Active   dorzolamide-timolol (COSOPT) 2-0.5 % ophthalmic solution [id number] Yes Place 1 drop into the left eye 2 (two) times daily. [provider] Taking Active    hydrochlorothiazide (HYDRODIURIL) 25 MG tablet [id number] Yes Take 0.5 tablets (12.5 mg total) by mouth daily. Simmons-Robinson, Makiera, MD Taking Active   levothyroxine (SYNTHROID, LEVOTHROID) 88 MCG tablet [id number] Yes Take 88 mcg daily before breakfast by mouth. [provider] Taking Active Self  PREDNISOLONE ACETATE P-F 1 % ophthalmic suspension [id number] Yes Place 1 drop into the left eye 3 (three) times daily. [provider] Taking Active   valACYclovir (VALTREX) 1000 MG tablet [id number] Yes Take 1 tablet (1,000 mg total) by mouth daily. If the eye pain is still present after the first 30 days of taking this medication, then there is no reason to continue taking it. Allie Bossier, MD Taking Active             Home Care and Equipment/Supplies: Were Home Health Services Ordered?: NA Any new equipment or medical supplies ordered?: NA  Functional Questionnaire: Do you need assistance with bathing/showering or dressing?: No Do you need assistance with meal preparation?: No Do you need assistance with eating?: No Do you have difficulty maintaining continence: No Do you need assistance with getting out of bed/getting out of a chair/moving?: No Do you have difficulty managing or taking your medications?: No  Follow up appointments reviewed: PCP Follow-up appointment confirmed?: Yes Date of PCP follow-up appointment?: 06/06/23 Follow-up Provider: [facility] Follow-up appointment confirmed?: NA Do you need transportation to your follow-up appointment?: No Do you understand care options if your condition(s) worsen?: Yes-patient verbalized understanding    SIGNATURE Karena Addison, LPN [facility] Nurse Health Advisor Direct Dial [phone]

## 2023-06-08 NOTE — Telephone Encounter (Signed)
Patient called sttd she was admitted into the hospital for blocked intestines, hernia, swollen feet and blood clot. She is requesting provider obtain her medical records from [facility] in [place]). Patient has an appt with provider tomorrow at 3:40pm.

## 2023-06-09 ENCOUNTER — Ambulatory Visit: Payer: Medicare Other | Admitting: Family Medicine

## 2023-06-09 ENCOUNTER — Encounter: Payer: Self-pay | Admitting: Family Medicine

## 2023-06-09 VITALS — BP 151/62 | HR 78 | Resp 16 | Wt 144.7 lb

## 2023-06-09 DIAGNOSIS — K449 Diaphragmatic hernia without obstruction or gangrene: Secondary | ICD-10-CM | POA: Diagnosis not present

## 2023-06-09 DIAGNOSIS — K56600 Partial intestinal obstruction, unspecified as to cause: Secondary | ICD-10-CM | POA: Diagnosis not present

## 2023-06-09 DIAGNOSIS — I2699 Other pulmonary embolism without acute cor pulmonale: Secondary | ICD-10-CM

## 2023-06-09 MED ORDER — APIXABAN 5 MG PO TABS: 5.0000 mg | ORAL_TABLET | Freq: Two times a day (BID) | ORAL | 1 refills | Status: AC

## 2023-06-09 NOTE — Assessment & Plan Note (Signed)
Difficulty swallowing secondary to hiatal hernia. Currently managing with dietary modifications. Newly diagnosed, stable Pt would like to hold off on surgical consultation for now  -Continue current diet with careful chewing and small bites.

## 2023-06-09 NOTE — Assessment & Plan Note (Signed)
Recent diagnosis of pulmonary embolism, currently on Eliquis. No current symptoms of dyspnea or chest pain. -newly diagnosed during hospitalization  -Continue Eliquis 5mg  twice daily. -Pt provided with 2 weeks of samples to supplement Eliquis and refill if necessary. -Plan to discontinue Eliquis after three months (Sep 06 2023) of therapy, pending reassessment.

## 2023-06-09 NOTE — Progress Notes (Signed)
Established patient visit   Patient: Renee Powell   DOB: June 27, 1934   89 y.o. Female  MRN: [id number] Visit Date: 06/09/2023  Today's healthcare provider: Ronnald Ramp, MD   Chief Complaint  Patient presents with   Hospitalization Follow-up    No concerns today    Subjective     HPI     Hospitalization Follow-up    Additional comments: No concerns today       Last edited by Rolly Salter, CMA on 06/09/2023  4:15 PM.       Discussed the use of AI scribe software for clinical note transcription with the patient, who gave verbal consent to proceed.  History of Present Illness   The patient, with a history of high blood pressure and shingles in the eye, presented with a recent episode of severe abdominal pain and [place] the [place]. The pain was described as a burning sensation in the stomach, which was severe enough to prompt a visit to a walk-in clinic. There, the patient was found to have a temperature of 104 degrees Fahrenheit and was advised to go to the ER.  At the ER, the patient was diagnosed with a blocked intestine, which was managed by flushing out the blockage. This procedure was followed by two days of continuous bowel movements. During the investigation, a hernia was discovered in the stomach, which has since caused difficulty in swallowing.  While in the hospital, a physician noticed a small blockage, identified as a blood clot, on the patient's lung. However, the surgeon reassured the patient that it was not a cause for concern and would likely resolve on its own.  The patient was hospitalized for nine days, during which they experienced continuous diarrhea and were on a liquid diet until the day before discharge.  The patient also reported a significant weight gain of eight pounds, attributed to fluid retention, which has been gradually reducing. The patient has been managing to eat small, finely chopped solid foods since discharge and  has not experienced any nausea or vomiting.  The patient also mentioned a history of shingles in the eye, which has been causing persistent discomfort. The patient had previously undergone laser surgery to reduce high eye pressure, but the procedure did not yield the desired results.  The patient's overall condition has improved since discharge, with the only complaint being weakness and a lack of energy. The patient is currently focusing on recovery and regaining strength.         Past Medical History:  Diagnosis Date   Actinic keratosis    Basal cell carcinoma 03/19/2022   right pretibia, [facility] 04/15/2022   Breast cancer (HCC) 2002   right lumpectomy with radiation   Cancer (HCC) 2002   breast   Hyperlipidemia    Personal history of radiation therapy 2002   Right breast   Shingles of eyelid    Squamous cell carcinoma of skin 03/18/2023   left lat neck - SCCIS, ED&C   Thyroid disease 09/2019   Pt had tumors treated with radiation therapy    Medications: Outpatient Medications Prior to Visit  Medication Sig   amLODipine (NORVASC) 5 MG tablet Take 1 tablet (5 mg total) by mouth daily.   bimatoprost (LUMIGAN) 0.01 % SOLN Place 1 drop into both eyes at bedtime.   brimonidine (ALPHAGAN) 0.2 % ophthalmic solution Place 1 drop into the left eye 3 (three) times daily.   dorzolamide-timolol (COSOPT) 2-0.5 % ophthalmic solution  Place 1 drop into the left eye 2 (two) times daily.   levothyroxine (SYNTHROID, LEVOTHROID) 88 MCG tablet Take 88 mcg daily before breakfast by mouth.   PREDNISOLONE ACETATE P-F 1 % ophthalmic suspension Place 1 drop into the left eye 3 (three) times daily.   [DISCONTINUED] apixaban (ELIQUIS) 5 MG TABS tablet Take 5 mg by mouth 2 (two) times daily.   carboxymethylcellulose (REFRESH PLUS) 0.5 % SOLN 1 drop 3 (three) times daily as needed.   hydrochlorothiazide (HYDRODIURIL) 25 MG tablet Take 0.5 tablets (12.5 mg total) by mouth daily. (Patient not taking: Reported on  06/09/2023)   valACYclovir (VALTREX) 1000 MG tablet Take 1 tablet (1,000 mg total) by mouth daily. If the eye pain is still present after the first 30 days of taking this medication, then there is no reason to continue taking it. (Patient not taking: Reported on 06/09/2023)   No facility-administered medications prior to visit.    Review of Systems      Objective    BP (!) 151/62 (BP Location: Left Arm, Patient Position: Sitting, Cuff Size: Normal)   Pulse 78   Resp 16   Wt 144 lb 11.2 oz (65.6 kg)   SpO2 100%   BMI 24.08 kg/m     Physical Exam Vitals reviewed.  Constitutional:      General: She is not in acute distress.    Appearance: Normal appearance. She is not ill-appearing, toxic-appearing or diaphoretic.  Eyes:     Conjunctiva/sclera: Conjunctivae normal.  Cardiovascular:     Rate and Rhythm: Normal rate and regular rhythm.     Pulses: Normal pulses.     Heart sounds: Normal heart sounds. No murmur heard.    No friction rub. No gallop.  Pulmonary:     Effort: Pulmonary effort is normal. No respiratory distress.     Breath sounds: Normal breath sounds. No stridor. No wheezing, rhonchi or rales.  Abdominal:     General: Bowel sounds are normal. There is no distension.     Palpations: Abdomen is soft.     Tenderness: There is no abdominal tenderness.  Musculoskeletal:     Right lower leg: Edema present.     Left lower leg: Edema present.     Comments: 1+ BLE   Skin:    Findings: No erythema or rash.  Neurological:     Mental Status: She is alert and oriented to person, place, and time.           No results found for any visits on 06/09/23.  Assessment & Plan     Problem List Items Addressed This Visit     Hiatal hernia    Difficulty swallowing secondary to hiatal hernia. Currently managing with dietary modifications. Newly diagnosed, stable Pt would like to hold off on surgical consultation for now  -Continue current diet with careful chewing and  small bites.       Pulmonary embolus (HCC) - Primary    Recent diagnosis of pulmonary embolism, currently on Eliquis. No current symptoms of dyspnea or chest pain. -newly diagnosed during hospitalization  -Continue Eliquis 5mg  twice daily. -Pt provided with 2 weeks of samples to supplement Eliquis and refill if necessary. -Plan to discontinue Eliquis after three months (Sep 06 2023) of therapy, pending reassessment.       Relevant Medications   apixaban (ELIQUIS) 5 MG TABS tablet        Intestinal Obstruction,resolved Recent hospitalization for intestinal obstruction with resolution after decompression. No current abdominal pain  or nausea. -Continue current diet with careful chewing and small bites due to concurrent hiatal hernia.   Return in about 2 months (around 08/09/2023) for PE.      Ronnald Ramp, MD  [facility] [phone] (phone) [phone] (fax)  [facility]

## 2023-08-18 ENCOUNTER — Encounter: Payer: Self-pay | Admitting: Family Medicine

## 2023-08-18 ENCOUNTER — Ambulatory Visit: Payer: Medicare Other | Admitting: Family Medicine

## 2023-08-18 VITALS — BP 143/55 | HR 63 | Resp 16 | Ht 65.0 in | Wt 132.4 lb

## 2023-08-18 DIAGNOSIS — I2694 Multiple subsegmental pulmonary emboli without acute cor pulmonale: Secondary | ICD-10-CM

## 2023-08-18 DIAGNOSIS — R1312 Dysphagia, oropharyngeal phase: Secondary | ICD-10-CM | POA: Diagnosis not present

## 2023-08-18 DIAGNOSIS — R600 Localized edema: Secondary | ICD-10-CM

## 2023-08-18 DIAGNOSIS — R1319 Other dysphagia: Secondary | ICD-10-CM | POA: Insufficient documentation

## 2023-08-18 DIAGNOSIS — I10 Essential (primary) hypertension: Secondary | ICD-10-CM

## 2023-08-18 NOTE — Assessment & Plan Note (Signed)
Significant improvement in lower extremity edema, now mild (1+ edema) in the left leg. Not taking hydrochlorothiazide as previously prescribed. - subacute following hospitalization in Sept  - Discontinue hydrochlorothiazide, pt reports never starting it  - Advise leg elevation and use of compression stockings

## 2023-08-18 NOTE — Progress Notes (Signed)
Established patient visit   Patient: Renee Powell   DOB: November 11, 1933   89 y.o. Female  MRN: [id number] Visit Date: 08/18/2023  Today's healthcare provider: Ronnald Ramp, MD   Chief Complaint  Patient presents with   Annual Exam   Subjective       Discussed the use of AI scribe software for clinical note transcription with the patient, who gave verbal consent to proceed.  History of Present Illness   The patient is an 89 year old individual with a history of pulmonary embolism diagnosed in September 2024, currently managed with Eliquis 5mg  twice daily. She was initially scheduled to stop anticoagulation at the end of December 2024, but a referral to hematology is now being considered for further evaluation and guidance on anticoagulation therapy duration.  The patient also has a history of bowel obstruction and has been experiencing normal bowel movements since her hospital discharge. However, she reported a recent episode of diarrhea, which resolved without intervention. She also reported persistent edema in her feet and legs, which has improved but still causes discomfort and difficulty wearing shoes.  The patient has been experiencing dysphagia, particularly with pill swallowing, which she attributes to a hernia in her stomach. She has been managing this by carefully monitoring her diet and ensuring food is adequately chewed before swallowing. She has not reported any hematemesis, hematochezia, or hematuria.  The patient has a history of breast cancer, for which she was on a drug study that successfully prevented tumor recurrence. She has been cancer-free for 22 years. The patient also reported a weight loss of approximately 8 pounds since her hospital discharge. She has a history of arthritis, which has necessitated the use of a handicap placard.         Past Medical History:  Diagnosis Date   Actinic keratosis    Basal cell carcinoma 03/19/2022   right  pretibia, [facility] 04/15/2022   Breast cancer (HCC) 2002   right lumpectomy with radiation   Cancer (HCC) 2002   breast   Hyperlipidemia    Personal history of radiation therapy 2002   Right breast   Shingles of eyelid    Squamous cell carcinoma of skin 03/18/2023   left lat neck - SCCIS, ED&C   Thyroid disease 09/2019   Pt had tumors treated with radiation therapy    Medications: Outpatient Medications Prior to Visit  Medication Sig   amLODipine (NORVASC) 5 MG tablet Take 1 tablet (5 mg total) by mouth daily.   apixaban (ELIQUIS) 5 MG TABS tablet Take 1 tablet (5 mg total) by mouth 2 (two) times daily.   bimatoprost (LUMIGAN) 0.01 % SOLN Place 1 drop into both eyes at bedtime.   brimonidine (ALPHAGAN) 0.2 % ophthalmic solution Place 1 drop into the left eye 3 (three) times daily.   carboxymethylcellulose (REFRESH PLUS) 0.5 % SOLN 1 drop 3 (three) times daily as needed.   dorzolamide-timolol (COSOPT) 2-0.5 % ophthalmic solution Place 1 drop into the left eye 2 (two) times daily.   levothyroxine (SYNTHROID, LEVOTHROID) 88 MCG tablet Take 88 mcg daily before breakfast by mouth.   PREDNISOLONE ACETATE P-F 1 % ophthalmic suspension Place 1 drop into the left eye 3 (three) times daily.   [DISCONTINUED] hydrochlorothiazide (HYDRODIURIL) 25 MG tablet Take 0.5 tablets (12.5 mg total) by mouth daily.   [DISCONTINUED] valACYclovir (VALTREX) 1000 MG tablet Take 1 tablet (1,000 mg total) by mouth daily. If the eye pain is still present after the first 30 days  of taking this medication, then there is no reason to continue taking it. (Patient not taking: Reported on 06/09/2023)   No facility-administered medications prior to visit.    Review of Systems  Last CBC Lab Results  Component Value Date   WBC 7.0 07/25/2021   HGB 13.9 07/25/2021   HCT 42.6 07/25/2021   MCV 88 07/25/2021   MCH 28.5 07/25/2021   RDW 12.9 07/25/2021   PLT 213 07/25/2021   Last metabolic panel Lab Results  Component  Value Date   GLUCOSE 90 03/24/2023   NA 142 03/24/2023   K 4.4 03/24/2023   CL 105 03/24/2023   CO2 25 03/24/2023   BUN 18 03/24/2023   CREATININE 0.87 03/24/2023   EGFR 64 03/24/2023   CALCIUM 9.7 03/24/2023   PROT 6.0 03/24/2023   ALBUMIN 4.0 03/24/2023   LABGLOB 2.0 03/24/2023   AGRATIO 2.1 07/25/2021   BILITOT 0.7 03/24/2023   ALKPHOS 102 03/24/2023   AST 15 03/24/2023   ALT 9 03/24/2023   ANIONGAP 7 05/13/2017        Objective    BP (!) 143/55   Pulse 63   Resp 16   Ht 5\' 5"  (1.651 m)   Wt 132 lb 6.4 oz (60.1 kg)   SpO2 95%   BMI 22.03 kg/m   BP Readings from Last 3 Encounters:  08/18/23 (!) 143/55  06/09/23 (!) 151/62  05/26/23 (!) 164/58   Wt Readings from Last 3 Encounters:  08/18/23 132 lb 6.4 oz (60.1 kg)  06/09/23 144 lb 11.2 oz (65.6 kg)  05/26/23 136 lb (61.7 kg)       Physical Exam  General: Alert, no acute distress Cardio: Normal S1 and S2, RRR, no r/m/g Pulm: CTAB, normal work of breathing Abdomen: Bowel sounds normal. Abdomen soft and non-tender. Abdomen is non-distended  Extremities: bilateral pedal edema, Left foot>right foot (1+ pitting edema on right)  No erythema of skin of lower extremities     No results found for any visits on 08/18/23.  Assessment & Plan     Problem List Items Addressed This Visit       Cardiovascular and Mediastinum   Pulmonary embolus (HCC) - Primary    Diagnosed with pulmonary embolism in September 2024. Currently on Eliquis 5 mg twice daily. No signs of bleeding (no hemoptysis, melena, or hematuria). Discussed hematology referral to evaluate the cause and determine anticoagulation duration. Addressed concerns about clot origin; explaining standard management approach. - Continue Eliquis 5 mg twice daily - Refer to hematology for evaluation and anticoagulation recommendations - Order CBC and CMP to monitor blood counts and kidney/liver function -pt provided with samples today per her request 28 doses  worth of samples given       Relevant Orders   Ambulatory referral to Hematology / Oncology   CBC   Primary hypertension    Blood pressure today is 143/55, improved from the last visit (151/xx). Currently managed with medication. Chronic within goal range of less than 150/90 - Continue current antihypertensive regimen - Monitor blood pressure regularly        Digestive   Esophageal dysphagia    Difficulty swallowing larger pills and certain foods. Swallow test performed in hospital; personally reviewed results showing normal barium swallow from Sept 2024; advised to eat finely chopped foods and avoid choking hazards. - Continue dietary modifications to avoid choking - Monitor for worsening symptoms -would recommend GI referral for possible endoscopy if symptoms worsen  Other   Pedal edema    Significant improvement in lower extremity edema, now mild (1+ edema) in the left leg. Not taking hydrochlorothiazide as previously prescribed. - subacute following hospitalization in Sept  - Discontinue hydrochlorothiazide, pt reports never starting it  - Advise leg elevation and use of compression stockings      Relevant Orders   Comprehensive metabolic panel   CBC      Bowel Obstruction (Resolved) Previously treated bowel obstruction. Currently normal bowel movements with a recent episode of mild diarrhea, now resolved. No ongoing symptoms. - Monitor bowel movements and dietary intake  General Health Maintenance Generally in good health with a BMI of 22. Breast cancer in remission for 22 years. - Encourage regular follow-up with primary care and specialists - Maintain a healthy diet and exercise routine     Return in about 3 months (around 11/16/2023) for CHRONIC F/U.       Ronnald Ramp, MD  [facility] [phone] (phone) [phone] (fax)  [facility]

## 2023-08-18 NOTE — Assessment & Plan Note (Addendum)
Diagnosed with pulmonary embolism in September 2024. Currently on Eliquis 5 mg twice daily. No signs of bleeding (no hemoptysis, melena, or hematuria). Discussed hematology referral to evaluate the cause and determine anticoagulation duration. Addressed concerns about clot origin; explaining standard management approach. - Continue Eliquis 5 mg twice daily - Refer to hematology for evaluation and anticoagulation recommendations - Order CBC and CMP to monitor blood counts and kidney/liver function -pt provided with samples today per her request 28 doses worth of samples given

## 2023-08-18 NOTE — Patient Instructions (Signed)
VISIT SUMMARY:  Today, we reviewed your current health status and discussed several ongoing health issues. We addressed your pulmonary embolism management, peripheral edema, dysphagia, hypertension, and recent bowel obstruction. We also discussed general health maintenance and follow-up plans.  YOUR PLAN:  -PULMONARY EMBOLISM: A pulmonary embolism is a blood clot in the lungs. You are currently taking Eliquis 5 mg twice daily to manage this condition. We will refer you to a hematologist to evaluate the cause and determine how long you need to continue this medication. We will also monitor your blood counts and kidney/liver function with a CBC and CMP test.  -PERIPHERAL EDEMA: Peripheral edema is swelling in the lower legs. Your swelling has improved but is still present. We will discontinue your hydrochlorothiazide and recommend that you elevate your legs and use compression stockings to help reduce the swelling.  -DYSPHAGIA: Dysphagia is difficulty swallowing. You have trouble swallowing larger pills and certain foods. Continue to eat finely chopped foods and avoid choking hazards. Monitor for any worsening symptoms.  -HYPERTENSION: Hypertension is high blood pressure. Your blood pressure has improved and is currently 143/55. Continue with your current medication and monitor your blood pressure regularly.  -BOWEL OBSTRUCTION (RESOLVED): A bowel obstruction is a blockage in the intestines. Your bowel obstruction has resolved, and you are having normal bowel movements. Monitor your bowel movements and dietary intake to ensure there are no further issues.  -GENERAL HEALTH MAINTENANCE: You are generally in good health with a BMI of 22. Your breast cancer has been in remission for 22 years. Continue regular follow-ups with your primary care doctor and specialists, and maintain a healthy diet and exercise routine.  INSTRUCTIONS:  Please schedule a follow-up appointment with hematology for further  evaluation of your pulmonary embolism and anticoagulation therapy. Continue to follow up with your primary care doctor as needed.

## 2023-08-18 NOTE — Assessment & Plan Note (Signed)
Blood pressure today is 143/55, improved from the last visit (151/xx). Currently managed with medication. Chronic within goal range of less than 150/90 - Continue current antihypertensive regimen - Monitor blood pressure regularly

## 2023-08-18 NOTE — Assessment & Plan Note (Signed)
Difficulty swallowing larger pills and certain foods. Swallow test performed in hospital; personally reviewed results showing normal barium swallow from Sept 2024; advised to eat finely chopped foods and avoid choking hazards. - Continue dietary modifications to avoid choking - Monitor for worsening symptoms -would recommend GI referral for possible endoscopy if symptoms worsen

## 2023-08-19 LAB — COMPREHENSIVE METABOLIC PANEL
ALT: 13 [IU]/L (ref 0–32)
AST: 19 [IU]/L (ref 0–40)
Albumin: 4.2 g/dL (ref 3.7–4.7)
Alkaline Phosphatase: 90 [IU]/L (ref 44–121)
BUN/Creatinine Ratio: 21 (ref 12–28)
BUN: 18 mg/dL (ref 8–27)
Bilirubin Total: 0.8 mg/dL (ref 0.0–1.2)
CO2: 24 mmol/L (ref 20–29)
Calcium: 9.7 mg/dL (ref 8.7–10.3)
Chloride: 107 mmol/L — ABNORMAL HIGH (ref 96–106)
Creatinine, Ser: 0.84 mg/dL (ref 0.57–1.00)
Globulin, Total: 2 g/dL (ref 1.5–4.5)
Glucose: 90 mg/dL (ref 70–99)
Potassium: 4.2 mmol/L (ref 3.5–5.2)
Sodium: 144 mmol/L (ref 134–144)
Total Protein: 6.2 g/dL (ref 6.0–8.5)
eGFR: 67 mL/min/{1.73_m2} (ref >59)

## 2023-08-19 LAB — CBC
Hematocrit: 42.2 % (ref 34.0–46.6)
Hemoglobin: 13.2 g/dL (ref 11.1–15.9)
MCH: 29.2 pg (ref 26.6–33.0)
MCHC: 31.3 g/dL — ABNORMAL LOW (ref 31.5–35.7)
MCV: 93 fL (ref 79–97)
Platelets: 208 10*3/uL (ref 150–450)
RBC: 4.52 x10E6/uL (ref 3.77–5.28)
RDW: 13.6 % (ref 11.7–15.4)
WBC: 7.1 10*3/uL (ref 3.4–10.8)

## 2023-08-24 ENCOUNTER — Encounter: Payer: Self-pay | Admitting: Family Medicine

## 2023-08-25 ENCOUNTER — Inpatient Hospital Stay: Payer: Medicare Other

## 2023-08-25 ENCOUNTER — Encounter: Payer: Self-pay | Admitting: Oncology

## 2023-08-25 ENCOUNTER — Inpatient Hospital Stay: Payer: Medicare Other | Attending: Oncology | Admitting: Oncology

## 2023-08-25 VITALS — BP 162/86 | HR 66 | Temp 96.6°F | Resp 16 | Ht 65.0 in | Wt 131.0 lb

## 2023-08-25 DIAGNOSIS — Z85828 Personal history of other malignant neoplasm of skin: Secondary | ICD-10-CM | POA: Insufficient documentation

## 2023-08-25 DIAGNOSIS — Z7901 Long term (current) use of anticoagulants: Secondary | ICD-10-CM | POA: Diagnosis not present

## 2023-08-25 DIAGNOSIS — I2699 Other pulmonary embolism without acute cor pulmonale: Secondary | ICD-10-CM | POA: Diagnosis present

## 2023-08-25 DIAGNOSIS — Z801 Family history of malignant neoplasm of trachea, bronchus and lung: Secondary | ICD-10-CM | POA: Diagnosis not present

## 2023-08-25 DIAGNOSIS — Z853 Personal history of malignant neoplasm of breast: Secondary | ICD-10-CM | POA: Diagnosis not present

## 2023-08-25 DIAGNOSIS — Z79899 Other long term (current) drug therapy: Secondary | ICD-10-CM | POA: Diagnosis not present

## 2023-08-25 DIAGNOSIS — Z86711 Personal history of pulmonary embolism: Secondary | ICD-10-CM | POA: Insufficient documentation

## 2023-08-25 DIAGNOSIS — Z803 Family history of malignant neoplasm of breast: Secondary | ICD-10-CM | POA: Insufficient documentation

## 2023-08-25 DIAGNOSIS — Z79890 Hormone replacement therapy (postmenopausal): Secondary | ICD-10-CM | POA: Diagnosis not present

## 2023-08-25 DIAGNOSIS — K56609 Unspecified intestinal obstruction, unspecified as to partial versus complete obstruction: Secondary | ICD-10-CM | POA: Insufficient documentation

## 2023-08-25 DIAGNOSIS — E785 Hyperlipidemia, unspecified: Secondary | ICD-10-CM | POA: Diagnosis not present

## 2023-08-25 NOTE — Progress Notes (Unsigned)
[facility]  Telephone:([phone] Fax:([phone]) [phone]  ID: Renee Powell OB: 05-Jan-1934  MR#: [id number]  AOZ#:[id number]  Patient Care Team: Ronnald Ramp, MD as PCP - General (Family Medicine) Patrecia Pace, Delsa Sale, MD as Attending Physician (Endocrinology) Nadara Mustard, MD as Referring Physician (Obstetrics and Gynecology) Lockie Mola, MD as Referring Physician (Ophthalmology)  CHIEF COMPLAINT: Bilateral pulmonary embolism.  INTERVAL HISTORY: Patient was last seen in clinic in July 2018 and has a distant history of breast cancer.  More recently she developed bilateral PE in September 2024 around the time she was also in the hospital for a small bowel obstruction.  She was subsequently placed on Eliquis.  Currently, she feels well and is back to her baseline.  She has no neurologic complaints.  She denies any recent fevers or illnesses.  She has a good appetite and denies weight loss.  She has no chest pain, shortness of breath, cough, or hemoptysis.  She denies any further abdominal pain.  She has no nausea, vomiting, constipation, or diarrhea.  She has no melena or hematochezia.  She has no urinary complaints.  Patient offers no specific complaints today.  REVIEW OF SYSTEMS:   Review of Systems  Constitutional: Negative.  Negative for fever, malaise/fatigue and weight loss.  Respiratory: Negative.  Negative for cough, hemoptysis and shortness of breath.   Cardiovascular: Negative.  Negative for chest pain and leg swelling.  Gastrointestinal: Negative.  Negative for abdominal pain.  Genitourinary: Negative.  Negative for dysuria.  Musculoskeletal: Negative.  Negative for back pain.  Skin: Negative.  Negative for rash.  Neurological: Negative.  Negative for dizziness, focal weakness, weakness and headaches.  Psychiatric/Behavioral: Negative.  The patient is not nervous/anxious.     As per HPI. Otherwise, a complete review of systems is  negative.  PAST MEDICAL HISTORY: Past Medical History:  Diagnosis Date   Actinic keratosis    Basal cell carcinoma 03/19/2022   right pretibia, [facility] 04/15/2022   Breast cancer (HCC) 2002   right lumpectomy with radiation   Cancer [facility]) 2002   breast   Hyperlipidemia    Personal history of radiation therapy 2002   Right breast   Shingles of eyelid    Squamous cell carcinoma of skin 03/18/2023   left lat neck - SCCIS, ED&C   Thyroid disease 09/2019   Pt had tumors treated with radiation therapy    PAST SURGICAL HISTORY: Past Surgical History:  Procedure Laterality Date   BREAST BIOPSY Right 2002   breast ca radation   BREAST SURGERY Right 2002   lumpectomy   COLONOSCOPY WITH PROPOFOL N/A 05/06/2017   Procedure: COLONOSCOPY WITH PROPOFOL;  Surgeon: Wyline Mood, MD;  Location: [facility] ENDOSCOPY;  Service: Gastroenterology;  Laterality: N/A;    FAMILY HISTORY: Family History  Problem Relation Age of Onset   Cancer Father        lung cancer   Cancer Maternal Uncle    Cancer Paternal Aunt        Breast   Cancer Paternal Uncle        Kidney   Cancer Paternal Uncle        Lung   Heart attack Mother    Cancer Paternal Grandmother        Breast   Breast cancer Maternal Aunt 60   Breast cancer Maternal Grandmother 78    ADVANCED DIRECTIVES (Y/N):  N  HEALTH MAINTENANCE: Social History   Tobacco Use   Smoking status: Never   Smokeless tobacco:  Never  Vaping Use   Vaping status: Never Used  Substance Use Topics   Alcohol use: No    Alcohol/week: 0.0 standard drinks of alcohol   Drug use: No     Colonoscopy:  PAP:  Bone density:  Lipid panel:  Allergies  Allergen Reactions   Latex     Current Outpatient Medications  Medication Sig Dispense Refill   amLODipine (NORVASC) 5 MG tablet Take 1 tablet (5 mg total) by mouth daily. 90 tablet 3   apixaban (ELIQUIS) 5 MG TABS tablet Take 1 tablet (5 mg total) by mouth 2 (two) times daily. 60 tablet 1   bimatoprost  (LUMIGAN) 0.01 % SOLN Place 1 drop into both eyes at bedtime.     brimonidine (ALPHAGAN) 0.2 % ophthalmic solution Place 1 drop into the left eye 3 (three) times daily.     carboxymethylcellulose (REFRESH PLUS) 0.5 % SOLN 1 drop 3 (three) times daily as needed.     dorzolamide-timolol (COSOPT) 2-0.5 % ophthalmic solution Place 1 drop into the left eye 2 (two) times daily.     levothyroxine (SYNTHROID, LEVOTHROID) 88 MCG tablet Take 88 mcg daily before breakfast by mouth.     PREDNISOLONE ACETATE P-F 1 % ophthalmic suspension Place 1 drop into the left eye 3 (three) times daily.     No current facility-administered medications for this visit.    OBJECTIVE: Vitals:   08/25/23 1133 08/25/23 1143  BP: (!) 162/86   Pulse: 66   Resp: 16   Temp: (!) 96.6 F (35.9 C) (!) 96.6 F (35.9 C)  SpO2: 100%      Body mass index is 21.8 kg/m.    ECOG FS:0 - Asymptomatic  General: Well-developed, well-nourished, no acute distress. Eyes: Pink conjunctiva, anicteric sclera. HEENT: Normocephalic, moist mucous membranes. Lungs: No audible wheezing or coughing. Heart: Regular rate and rhythm. Abdomen: Soft, nontender, no obvious distention. Musculoskeletal: No edema, cyanosis, or clubbing. Neuro: Alert, answering all questions appropriately. Cranial nerves grossly intact. Skin: No rashes or petechiae noted. Psych: Normal affect. Lymphatics: No cervical, calvicular, axillary or inguinal LAD.   LAB RESULTS:  Lab Results  Component Value Date   NA 144 08/18/2023   K 4.2 08/18/2023   CL 107 (H) 08/18/2023   CO2 24 08/18/2023   GLUCOSE 90 08/18/2023   BUN 18 08/18/2023   CREATININE 0.84 08/18/2023   CALCIUM 9.7 08/18/2023   PROT 6.2 08/18/2023   ALBUMIN 4.2 08/18/2023   AST 19 08/18/2023   ALT 13 08/18/2023   ALKPHOS 90 08/18/2023   BILITOT 0.8 08/18/2023   GFRNONAA 58 (L) 04/24/2020   GFRAA 67 04/24/2020    Lab Results  Component Value Date   WBC 7.1 08/18/2023   NEUTROABS 4.1  07/25/2021   HGB 13.2 08/18/2023   HCT 42.2 08/18/2023   MCV 93 08/18/2023   PLT 208 08/18/2023     STUDIES: No results found.  ASSESSMENT: Bilateral pulmonary embolism.  PLAN:    Bilateral pulmonary embolism: PE protocol done at an outside hospital and occurred surrounding a time patient was acutely ill and admitted for a small bowel obstruction.  PE was found incidentally.  She was placed on Eliquis and is tolerating treatment well.  Suspect PE was related to her acute illness at the time.  No need to do a full hypercoagulable workup.  Have recommended 6 months of Eliquis completing at the end of March 2025 and then patient can discontinue. Small bowel obstruction: Resolved.  Patient was treated  with conservative management. History of right breast cancer: Stage IIa, ER/PR positive.  Patient's most recent bilateral screening mammogram on August 25, 2022 was reported as BI-RADS 1.  Repeat in December 2024.  These are ordered by her primary care physician. Disposition: Continue Eliquis through March 2025 as above.  No follow-up has been scheduled.  I spent a total of 45 minutes reviewing chart data, face-to-face evaluation with the patient, counseling and coordination of care as detailed above.   Patient expressed understanding and was in agreement with this plan. She also understands that She can call clinic at any time with any questions, concerns, or complaints.    Cancer Staging  Cancer of right female breast [facility]) Staging form: Breast, AJCC 7th Edition - Clinical stage from 04/07/2015: Stage IIA (T2, N0, M0) - Signed by Jeralyn Ruths, MD on 04/07/2015 Laterality: Right Estrogen receptor status: Positive Progesterone receptor status: Positive HER2 status: Negative   Jeralyn Ruths, MD   08/25/2023 11:53 AM

## 2023-08-25 NOTE — Progress Notes (Unsigned)
[place] Was told that she had a blood clot in her lung. She wanted to be seen here to see if it is a blood clot and "what is causing it". Was in seen by Dr. Orlie Dakin in the past for breast cancer.

## 2023-09-08 ENCOUNTER — Ambulatory Visit: Payer: Medicare Other | Admitting: Obstetrics

## 2023-09-08 ENCOUNTER — Encounter: Payer: Self-pay | Admitting: Obstetrics

## 2023-09-08 VITALS — BP 151/50 | HR 60 | Ht 65.0 in | Wt 129.0 lb

## 2023-09-08 DIAGNOSIS — Z4689 Encounter for fitting and adjustment of other specified devices: Secondary | ICD-10-CM | POA: Diagnosis not present

## 2023-09-08 DIAGNOSIS — N8110 Cystocele, unspecified: Secondary | ICD-10-CM

## 2023-09-08 DIAGNOSIS — N814 Uterovaginal prolapse, unspecified: Secondary | ICD-10-CM

## 2023-09-08 NOTE — Progress Notes (Signed)
  HPI:      [name] is a 89 y.o. G2P2 who presents today for her pessary follow up and examination related to her pelvic floor weakening.  Pt reports tolerating the pessary well with no vaginal bleeding and no vaginal discharge.  Symptoms of pelvic floor weakening have greatly improved. She is voiding and defecating without difficulty. She currently has a #4 ring pessary.  PMHx: She  has a past medical history of Actinic keratosis, Basal cell carcinoma (03/19/2022), Breast cancer (HCC) (2002), Cancer (HCC) (2002), Hyperlipidemia, Personal history of radiation therapy (2002), Shingles of eyelid, Squamous cell carcinoma of skin (03/18/2023), and Thyroid  disease (09/2019). Also,  has a past surgical history that includes Breast surgery (Right, 2002); Colonoscopy with propofol  (N/A, 05/06/2017); and Breast biopsy (Right, 2002)., family history includes Breast cancer (age of onset: 76) in her maternal aunt and maternal grandmother; Cancer in her father, maternal uncle, paternal aunt, paternal grandmother, paternal uncle, and paternal uncle; Heart attack in her mother.,  reports that she has never smoked. She has never used smokeless tobacco. She reports that she does not drink alcohol and does not use drugs.  She has a current medication list which includes the following prescription(s): amlodipine , bimatoprost, brimonidine, carboxymethylcellulose, dorzolamide-timolol, latanoprost, levothyroxine , prednisolone acetate p-f, and apixaban . Also, is allergic to latex.  Review of Systems  All other systems reviewed and are negative.   Objective: BP (!) 151/50   Pulse 60   Ht 5' 5 (1.651 m)   Wt 129 lb (58.5 kg)   BMI 21.47 kg/m  Physical Exam Constitutional:      Appearance: Normal appearance.  Genitourinary:     No lesions in the vagina.     No vaginal discharge, erythema, tenderness, bleeding or ulceration.     Anterior vaginal prolapse present.    Uterus is prolapsed.  Neurological:      Mental Status: She is alert.  Vitals reviewed. Exam conducted with a chaperone present.    Pessary Care Pessary removed and cleaned.  Vagina checked - without erosions - pessary replaced.  A/P: #4 ring pessary was cleaned and replaced today. Instructions given for care. Concerning symptoms to observe for are counseled to patient. Follow up scheduled for 3 months.   Estil Mangle, DO [facility]

## 2023-09-08 NOTE — Patient Instructions (Signed)
 How to Use a Vaginal Pessary  A vaginal pessary is a removable device that is placed into your vagina to support pelvic organs that droop. These organs include your uterus, bladder, and rectum. When your pelvic organs drop down into your vagina, it causes a condition called pelvic organ prolapse (POP). A pessary may be an alternative to surgery for women with POP. It may help women who leak urine when they strain or exercise (stress incontinence). This is a symptom of POP. A vaginal pessary may also be a temporary treatment for stress incontinence during pregnancy. There are several types of pessaries. All types are usually made of silicone. You can insert and remove some on your own. Other types must be inserted and removed by your health care provider at office visits. The reason you are using a pessary and the severity of your condition will determine which one is best for you. It is also important to find the right size. A pessary that is too small may fall out. A pessary that is too large may cause pain or discomfort. Your health care provider will do a physical exam to find the correct size and fit for your pessary. It may take several appointments to find the best fit for you. If you can be fit with the type of pessary that you can insert, remove, and clean yourself, your health care provider will teach you how to use your pessary at home. You may have checkups every few months. If you have the type of pessary that needs to be inserted and removed by your health care provider, you will have appointments every few months to have the pessary removed, cleaned, and replaced. What are the risks? When properly fitted and cared for, risks of using a vaginal pessary can be small. However, there can be problems that may include: Vaginal discharge. Vaginal bleeding. A bad smell coming from your vagina. Scraping of the skin inside your vagina. How to use your pessary Follow your health care provider's  instructions for using a pessary. These instructions may vary, depending on the type of pessary you have. To insert a pessary: Wash your hands with soap and water for at least 20 seconds. Squeeze or fold the pessary in half and lubricate the tip with a water-based lubricant. Insert the pessary into your vagina. It will unfold and provide support. To remove the pessary, gently tug it out of your vagina. You can remove the pessary every night or after several days. You can also remove it to have sex. How to care for your pessary If you have a pessary that you can remove: Clean your pessary with soap and water. Rinse well. Dry it completely before inserting it back into your vagina. Follow these instructions at home: Take over-the-counter and prescription medicines only as told by your health care provider. Your health care provider may prescribe an estrogen cream to moisten your vagina. Keep all follow-up visits. This is important. Contact a health care provider if: You feel any pain or discomfort when your pessary is in place. You continue to have stress incontinence. You have trouble keeping your pessary from falling out. You have an unusual vaginal discharge that is blood-tinged or smells bad. Summary A vaginal pessary is a removable device that is placed into your vagina to support pelvic organs that droop. This condition is called pelvic organ prolapse (POP). There are several types of pessaries. Some you can insert and remove on your own. Others must be inserted and  removed by your health care provider. The best type for you depends on the reason you are using a pessary and the severity of your condition. It is also important to find the right size. If you can use the type that you insert and remove on your own, your health care provider will teach you how to use it and schedule checkups every few months. If you have the type that needs to be inserted and removed by your health care  provider, you will have regular appointments to have your pessary removed, cleaned, and replaced. This information is not intended to replace advice given to you by your health care provider. Make sure you discuss any questions you have with your health care provider. Document Revised: 02/23/2020 Document Reviewed: 02/23/2020 Elsevier Patient Education  2024 [employer].

## 2023-09-10 ENCOUNTER — Telehealth: Payer: Self-pay | Admitting: Family Medicine

## 2023-09-10 NOTE — Telephone Encounter (Signed)
 Patient called stated she is suppose to have a root canal but need to come off the apixaban [facility]) before she can do it. She also stated her hair has fallen out and need to know if she needs to come into the office to discuss. Please f/u with patient

## 2023-09-10 NOTE — Telephone Encounter (Signed)
 Pt called back and was hoping  to have gotten a call back by now. Pt would like to stop taking apixaban  (ELIQUIS ) 5 MG TABS tablet asap.  Pt needs to get a root canal and is holding off on getting this root canal until she finds out about the Eliquis .  Pt states her hair is falling out and she attributes this to the Eliquis .  Pt really would like to stop the Eliquis .  Pt declined an appt b/c the first thing Dr Lang had is next Thurs.  She wants to stop this med before then.  Pt will be back home after 2:30 pm today.

## 2023-09-10 NOTE — Telephone Encounter (Signed)
 Pt can hold eliquis 5mg  BID for 5 days prior to procedure   She was recommended to continue the eliquis until March 2025 for her embolism.   Please schedule a follow up visit to discuss additional details

## 2023-09-10 NOTE — Telephone Encounter (Signed)
 I spoke to patient and relayed Dr. Feliberto recommendation to hold Eliquis  for 5 days prior to procedure. She voiced understanding.   I also advised that it was recommended that she stay on Eliquis  until March 2025.   I encouraged her to schedule an appointment to discuss her hair loss and whether to continue the eliquis . She declines scheduling at this time.  She feels certain that the Eliquis  is the cause of her hair loss and she feels like she should be able to stop it now. I again advised it is NOT recommended by Dr. Lang to stop the Eliquis  althogether. She verbalized understanding.  She states she is going to consider her options and call other physicians and will call us  back if she wants to schedule an appointment.

## 2023-09-21 ENCOUNTER — Encounter: Payer: Medicare Other | Admitting: Family Medicine

## 2023-09-21 ENCOUNTER — Other Ambulatory Visit: Payer: Self-pay | Admitting: Family Medicine

## 2023-09-21 DIAGNOSIS — Z1231 Encounter for screening mammogram for malignant neoplasm of breast: Secondary | ICD-10-CM

## 2023-10-12 ENCOUNTER — Ambulatory Visit
Admission: RE | Admit: 2023-10-12 | Discharge: 2023-10-12 | Disposition: A | Discharge status: Home / Self Care | Payer: Medicare Other | Source: Ambulatory Visit | Attending: Family Medicine | Admitting: Family Medicine

## 2023-10-12 DIAGNOSIS — Z1231 Encounter for screening mammogram for malignant neoplasm of breast: Secondary | ICD-10-CM | POA: Diagnosis present

## 2023-10-19 ENCOUNTER — Ambulatory Visit (INDEPENDENT_AMBULATORY_CARE_PROVIDER_SITE_OTHER): Payer: Medicare Other | Admitting: Dermatology

## 2023-10-19 DIAGNOSIS — W908XXA Exposure to other nonionizing radiation, initial encounter: Secondary | ICD-10-CM

## 2023-10-19 DIAGNOSIS — I781 Nevus, non-neoplastic: Secondary | ICD-10-CM

## 2023-10-19 DIAGNOSIS — Z1283 Encounter for screening for malignant neoplasm of skin: Secondary | ICD-10-CM | POA: Diagnosis not present

## 2023-10-19 DIAGNOSIS — Z85828 Personal history of other malignant neoplasm of skin: Secondary | ICD-10-CM

## 2023-10-19 DIAGNOSIS — L821 Other seborrheic keratosis: Secondary | ICD-10-CM

## 2023-10-19 DIAGNOSIS — L820 Inflamed seborrheic keratosis: Secondary | ICD-10-CM

## 2023-10-19 DIAGNOSIS — Z86007 Personal history of in-situ neoplasm of skin: Secondary | ICD-10-CM

## 2023-10-19 DIAGNOSIS — D229 Melanocytic nevi, unspecified: Secondary | ICD-10-CM

## 2023-10-19 DIAGNOSIS — Z8589 Personal history of malignant neoplasm of other organs and systems: Secondary | ICD-10-CM

## 2023-10-19 DIAGNOSIS — L814 Other melanin hyperpigmentation: Secondary | ICD-10-CM

## 2023-10-19 DIAGNOSIS — I8393 Asymptomatic varicose veins of bilateral lower extremities: Secondary | ICD-10-CM

## 2023-10-19 DIAGNOSIS — D1801 Hemangioma of skin and subcutaneous tissue: Secondary | ICD-10-CM

## 2023-10-19 DIAGNOSIS — L578 Other skin changes due to chronic exposure to nonionizing radiation: Secondary | ICD-10-CM

## 2023-10-19 DIAGNOSIS — Z8619 Personal history of other infectious and parasitic diseases: Secondary | ICD-10-CM

## 2023-10-19 NOTE — Patient Instructions (Addendum)

## 2023-10-19 NOTE — Progress Notes (Signed)
Follow-Up Visit   Subjective  Renee Powell is a 89 y.o. female who presents for the following: Skin Cancer Screening and Full Body Skin Exam, hx of BCC, SCC IS, Aks, check spots face, check growths forehead, irritating  The patient presents for Total-Body Skin Exam (TBSE) for skin cancer screening and mole check. The patient has spots, moles and lesions to be evaluated, some may be new or changing and the patient may have concern these could be cancer.  The following portions of the chart were reviewed this encounter and updated as appropriate: medications, allergies, medical history  Review of Systems:  No other skin or systemic complaints except as noted in HPI or Assessment and Plan.  Objective  Well appearing patient in no apparent distress; mood and affect are within normal limits.  A full examination was performed including scalp, head, eyes, ears, nose, lips, neck, chest, axillae, abdomen, back, buttocks, bilateral upper extremities, bilateral lower extremities, hands, feet, fingers, toes, fingernails, and toenails. All findings within normal limits unless otherwise noted below.   Relevant physical exam findings are noted in the Assessment and Plan.  L temple x 1, forehead x 18 (19) Stuck on waxy paps with erythema  Assessment & Plan   SKIN CANCER SCREENING PERFORMED TODAY.  ACTINIC DAMAGE - Chronic condition, secondary to cumulative UV/sun exposure - diffuse scaly erythematous macules with underlying dyspigmentation - Recommend daily broad spectrum sunscreen SPF 30+ to sun-exposed areas, reapply every 2 hours as needed.  - Staying in the shade or wearing long sleeves, sun glasses (UVA+UVB protection) and wide brim hats (4-inch brim around the entire circumference of the hat) are also recommended for sun protection.  - Call for new or changing lesions.  LENTIGINES, SEBORRHEIC KERATOSES, HEMANGIOMAS - Benign normal skin lesions - Benign-appearing - Call for any  changes  MELANOCYTIC NEVI - Tan-brown and/or pink-flesh-colored symmetric macules and papules - Benign appearing on exam today - Observation - Call clinic for new or changing moles - Recommend daily use of broad spectrum spf 30+ sunscreen to sun-exposed areas.   HISTORY OF BASAL CELL CARCINOMA OF THE SKIN - No evidence of recurrence today - Recommend regular full body skin exams - Recommend daily broad spectrum sunscreen SPF 30+ to sun-exposed areas, reapply every 2 hours as needed.  - Call if any new or changing lesions are noted between office visits  - R pretibia  HISTORY OF SQUAMOUS CELL CARCINOMA IN SITU OF THE SKIN - No evidence of recurrence today - Recommend regular full body skin exams - Recommend daily broad spectrum sunscreen SPF 30+ to sun-exposed areas, reapply every 2 hours as needed.  - Call if any new or changing lesions are noted between office visits  - L lat neck  HISTORY of SHINGLES L eye Exam: L eye with surrounding erythema Treatment Plan: Recommend continue f/u with Ophthalmologist   Varicose Veins/Spider Veins legs - Dilated blue, purple or red veins at the lower extremities - Reassured - Smaller vessels can be treated by sclerotherapy (a procedure to inject a medicine into the veins to make them disappear) if desired, but the treatment is not covered by insurance. Larger vessels may be covered if symptomatic and we would refer to vascular surgeon if treatment desired.  HISTORY OF PRECANCEROUS ACTINIC KERATOSIS - site(s) of PreCancerous Actinic Keratosis clear today. - these may recur and new lesions may form requiring treatment to prevent transformation into skin cancer - observe for new or changing spots and contact [facility] for appointment if occur - photoprotection with sun protective clothing; sunglasses and broad spectrum sunscreen with SPF of at least 30 + and frequent self skin exams recommended - yearly exams by a dermatologist  recommended for persons with history of PreCancerous Actinic Keratoses  INFLAMED SEBORRHEIC KERATOSIS (19) L temple x 1, forehead x 18 (19) Symptomatic, irritating, patient would like treated. Destruction of lesion - L temple x 1, forehead x 18 (19) Complexity: simple   Destruction method: cryotherapy   Informed consent: discussed and consent obtained   Timeout:  patient name, date of birth, surgical site, and procedure verified Lesion destroyed using liquid nitrogen: Yes   Region frozen until ice ball extended beyond lesion: Yes   Outcome: patient tolerated procedure well with no complications   Post-procedure details: wound care instructions given   Return in about 1 year (around 10/18/2024) for TBSE, Hx of BCC, Hx of SCC, Hx of AKs.  I, Ardis Rowan, RMA, am acting as scribe for Armida Sans, MD .   Documentation: I have reviewed the above documentation for accuracy and completeness, and I agree with the above.  Armida Sans, MD

## 2023-10-20 ENCOUNTER — Encounter: Payer: Self-pay | Admitting: Dermatology

## 2023-10-21 ENCOUNTER — Ambulatory Visit: Payer: Medicare Other | Admitting: Dermatology

## 2023-10-22 ENCOUNTER — Telehealth: Payer: Self-pay | Admitting: Family Medicine

## 2023-10-22 NOTE — Telephone Encounter (Signed)
Called patient to notify her that appt is no longer available on 11/24/23 due to provider not being in office that day and she needs to reschedule. Stated she would call back because she was currently driving

## 2023-10-27 ENCOUNTER — Ambulatory Visit: Payer: Medicare Other | Admitting: Dermatology

## 2023-11-24 ENCOUNTER — Encounter: Payer: Self-pay | Admitting: Family Medicine

## 2023-12-21 ENCOUNTER — Ambulatory Visit: Payer: Medicare Other | Admitting: Family Medicine

## 2024-01-04 ENCOUNTER — Ambulatory Visit (INDEPENDENT_AMBULATORY_CARE_PROVIDER_SITE_OTHER): Admitting: Obstetrics & Gynecology

## 2024-01-04 ENCOUNTER — Encounter: Payer: Self-pay | Admitting: Obstetrics & Gynecology

## 2024-01-04 VITALS — BP 135/82 | HR 80 | Ht 65.0 in | Wt 132.7 lb

## 2024-01-04 DIAGNOSIS — N814 Uterovaginal prolapse, unspecified: Secondary | ICD-10-CM | POA: Diagnosis not present

## 2024-01-04 DIAGNOSIS — T8389XA Other specified complication of genitourinary prosthetic devices, implants and grafts, initial encounter: Secondary | ICD-10-CM | POA: Diagnosis not present

## 2024-01-04 DIAGNOSIS — Z4689 Encounter for fitting and adjustment of other specified devices: Secondary | ICD-10-CM

## 2024-01-04 DIAGNOSIS — N898 Other specified noninflammatory disorders of vagina: Secondary | ICD-10-CM | POA: Diagnosis not present

## 2024-01-04 DIAGNOSIS — N8110 Cystocele, unspecified: Secondary | ICD-10-CM

## 2024-01-04 NOTE — Progress Notes (Signed)
    GYNECOLOGY PROGRESS NOTE  Subjective:    Patient ID: Renee Powell, female    DOB: Dec 08, 1933, 89 y.o.   MRN: [id number]  HPI  Patient is a 89 y.o. G2P2 who presents today for her pessary follow up and examination related to her pelvic floor weakening.  Pt reports tolerating the pessary well with no vaginal bleeding and no vaginal discharge.  Symptoms of pelvic floor weakening have greatly improved. She is voiding and defecating without difficulty. She currently has a #5 ring pessary. She previously used a #4 but it was falling out.    The following portions of the patient's history were reviewed and updated as appropriate: allergies, current medications, past family history, past medical history, past social history, past surgical history, and problem list.  Review of Systems Pertinent items are noted in HPI.   Objective:   Blood pressure 135/82, pulse 80, height 5\' 5"  (1.651 m), weight 132 lb 11.2 oz (60.2 kg). Body mass index is 22.08 kg/m. Well nourished, well hydrated White female, no apparent distress She is ambulating and conversing normally. I removed the pessary and it was cleaned. I inspected the vagina with a Graves speculum and noted 2 erosions on the left upper vagina   Assessment:   1. Uterine prolapse   2. Baden-Walker grade 4 cystocele   3. Pessary maintenance      Plan:   1. Uterine prolapse (Primary)  2. Baden-Walker grade 4 cystocele  3. Pessary maintenance  4. Vaginal erosion - the pessary will be left out for 1-2 weeks and she will come back for its placement at her convenience

## 2024-01-19 ENCOUNTER — Ambulatory Visit (INDEPENDENT_AMBULATORY_CARE_PROVIDER_SITE_OTHER): Admitting: Obstetrics & Gynecology

## 2024-01-19 ENCOUNTER — Encounter: Payer: Self-pay | Admitting: Obstetrics & Gynecology

## 2024-01-19 VITALS — BP 141/80 | HR 62 | Ht 65.0 in | Wt 131.1 lb

## 2024-01-19 DIAGNOSIS — N8110 Cystocele, unspecified: Secondary | ICD-10-CM

## 2024-01-19 DIAGNOSIS — N814 Uterovaginal prolapse, unspecified: Secondary | ICD-10-CM | POA: Diagnosis not present

## 2024-01-19 NOTE — Progress Notes (Signed)
   Established Patient Office Visit  Subjective   Patient ID: Renee Powell, female    DOB: [birth year]  Age: 89 y.o. MRN: [id number]  Chief Complaint  Patient presents with   Pessary Placement    HPI   Patient is a 89 y.o. G2P2 who presents today for her pessary follow up and examination related to her pelvic floor weakening. I noted 2 vaginal erosions at her visit on 01/04/2024 and she has not used her pessary since then. Symptoms of pelvic floor weakening have greatly improved with the pessary. She is voiding very frequently without the pessary and is happy that she can get it placed today. She currently has a #5 ring pessary. She previously used a #4 but it was falling out.   ROS    Objective:     BP (!) 141/80   Pulse 62   Ht 5\' 5"  (1.651 m)   Wt 131 lb 1.6 oz (59.5 kg)   BMI 21.82 kg/m    Physical Exam   Well nourished, well hydrated White female, no apparent distress She is ambulating and conversing normally. I inspected the vagina with a Graves speculum  The previously seen erosions on the edge of the cervix are healed. I replaced the #5 ring with support.   Assessment & Plan:   Uterine prolapse Come back 3 months/prn sooner   Nimco Bivens C Ethelda Deangelo, MD

## 2024-03-17 ENCOUNTER — Encounter: Admitting: Nurse Practitioner

## 2024-04-25 ENCOUNTER — Ambulatory Visit: Admitting: Obstetrics & Gynecology

## 2024-04-25 VITALS — BP 128/67 | HR 76 | Ht 65.0 in | Wt 135.2 lb

## 2024-04-25 DIAGNOSIS — Z4689 Encounter for fitting and adjustment of other specified devices: Secondary | ICD-10-CM

## 2024-04-25 DIAGNOSIS — N814 Uterovaginal prolapse, unspecified: Secondary | ICD-10-CM | POA: Diagnosis not present

## 2024-04-25 DIAGNOSIS — N8110 Cystocele, unspecified: Secondary | ICD-10-CM

## 2024-04-25 NOTE — Progress Notes (Signed)
    GYNECOLOGY PROGRESS NOTE  Subjective:    Patient ID: Renee Powell, female    DOB: Jan 25, 1934, 89 y.o.   MRN: [id number]  HPI  Patient is a 89 y.o. G2P2 here for pessary maintenance. She denies troubles with her pessary. She does report some abdominal swelling. She denies pain, anorexia, difficulty with eating, having flatus or issues with BMs. She has a h/o bowel obstruction.  The following portions of the patient's history were reviewed and updated as appropriate: allergies, current medications, past family history, past medical history, past social history, past surgical history, and problem list.  Review of Systems Pertinent items are noted in HPI.   Objective:   Blood pressure 128/67, pulse 76, height 5' 5 (1.651 m), weight 135 lb 3.2 oz (61.3 kg). Body mass index is 22.5 kg/m. Well nourished, well hydrated White female, no apparent distress She is ambulating and conversing normally. Abd- benign Bimanual exam- no masses or pain I removed her #5 ring with support and it was cleaned with soap and water. I inspected the vagina with a Graves speculum. I replaced the pessary. She tolerated this well.  Assessment:   Uterine prolapse Come back 3 months/prn sooner  Plan:  Uterine prolapse Come back 3 months/prn sooner

## 2024-06-08 ENCOUNTER — Emergency Department

## 2024-06-08 ENCOUNTER — Other Ambulatory Visit: Payer: Self-pay

## 2024-06-08 ENCOUNTER — Emergency Department
Admission: EM | Admit: 2024-06-08 | Discharge: 2024-06-08 | Disposition: A | Discharge status: Home / Self Care | Source: Ambulatory Visit | Attending: Emergency Medicine | Admitting: Emergency Medicine

## 2024-06-08 DIAGNOSIS — I10 Essential (primary) hypertension: Secondary | ICD-10-CM | POA: Insufficient documentation

## 2024-06-08 DIAGNOSIS — E039 Hypothyroidism, unspecified: Secondary | ICD-10-CM | POA: Diagnosis not present

## 2024-06-08 DIAGNOSIS — M79604 Pain in right leg: Secondary | ICD-10-CM | POA: Insufficient documentation

## 2024-06-08 HISTORY — DX: Other pulmonary embolism without acute cor pulmonale: I26.99

## 2024-06-08 LAB — CBC
HCT: 42.8 % (ref 36.0–46.0)
Hemoglobin: 13.9 g/dL (ref 12.0–15.0)
MCH: 30 pg (ref 26.0–34.0)
MCHC: 32.5 g/dL (ref 30.0–36.0)
MCV: 92.4 fL (ref 80.0–100.0)
Platelets: 209 K/uL (ref 150–400)
RBC: 4.63 MIL/uL (ref 3.87–5.11)
RDW: 13.9 % (ref 11.5–15.5)
WBC: 8.1 K/uL (ref 4.0–10.5)
nRBC: 0 % (ref 0.0–0.2)

## 2024-06-08 LAB — BASIC METABOLIC PANEL WITH GFR
Anion gap: 11 (ref 5–15)
BUN: 16 mg/dL (ref 8–23)
CO2: 23 mmol/L (ref 22–32)
Calcium: 9.3 mg/dL (ref 8.9–10.3)
Chloride: 108 mmol/L (ref 98–111)
Creatinine, Ser: 1.04 mg/dL — ABNORMAL HIGH (ref 0.44–1.00)
GFR, Estimated: 52 mL/min — ABNORMAL LOW (ref >60)
Glucose, Bld: 138 mg/dL — ABNORMAL HIGH (ref 70–99)
Potassium: 3.6 mmol/L (ref 3.5–5.1)
Sodium: 142 mmol/L (ref 135–145)

## 2024-06-08 LAB — TROPONIN I (HIGH SENSITIVITY): Troponin I (High Sensitivity): 2 ng/L (ref <18)

## 2024-06-08 NOTE — ED Triage Notes (Signed)
 Patient states pain and tightness to right calf; also reports high blood pressure readings at home.

## 2024-06-08 NOTE — ED Provider Notes (Signed)
 [facility] Provider Note    Event Date/Time   First MD Initiated Contact with Patient 06/08/24 1805     (approximate)   History   Claudication   HPI  Renee Powell is a 89 y.o. female with a history of hypertension, hyperlipidemia, SBO, PE, and hypothyroidism who presents with right leg pain.  The patient states that she has had bilateral leg pain and swelling for approximately the last year.  She has had increased right leg pain for approximately 1 month.  It is mostly resolved when she is lying flat and then comes when she stands up or moves.  The pain is in the back of her calf and going to just proximal to the knee.  She denies any acute left leg pain.  She states that she came in today because it is interfering with her walking and she was worried that she might have a blood clot.  She denies any numbness but does report mild tingling to the foot.  She denies any acute trauma to the leg.  She was treated for PE last year.  I reviewed the past medical records.  The patient's most recent outpatient encounters were with internal medicine on 5/21 to establish care with new primary care provider, and on 8/18 with OB/GYN for follow-up of a pessary.  She has no recent ED visits or hospitalizations.   Physical Exam   Triage Vital Signs: ED Triage Vitals  Encounter Vitals Group     BP 06/08/24 1757 (!) 190/75     Girls Systolic BP Percentile --      Girls Diastolic BP Percentile --      Boys Systolic BP Percentile --      Boys Diastolic BP Percentile --      Pulse Rate 06/08/24 1757 92     Resp 06/08/24 1757 20     Temp 06/08/24 1758 98.5 F (36.9 C)     Temp Source 06/08/24 1758 Oral     SpO2 06/08/24 1757 100 %     Weight 06/08/24 1758 132 lb (59.9 kg)     Height 06/08/24 1758 5' 5 (1.651 m)     Head Circumference --      Peak Flow --      Pain Score --      Pain Loc --      Pain Education --      Exclude from Growth Chart --     Most recent  vital signs: Vitals:   06/08/24 1758 06/08/24 1852  BP:  (!) 117/115  Pulse:  80  Resp:    Temp: 98.5 F (36.9 C)   SpO2:       General: Alert, well-appearing for age, no distress.  CV:  Good peripheral perfusion.  Resp:  Normal effort.  Abd:  No distention.  Other:  Right calf with mild to moderate tenderness.  No visible swelling.  No pitting edema.  No erythema, induration, or abnormal warmth.  2+ DP pulse.  Normal cap refill.  Motor and sensory intact distally.  Full range of motion at the knee and ankle.   ED Results / Procedures / Treatments   Labs (all labs ordered are listed, but only abnormal results are displayed) Labs Reviewed  BASIC METABOLIC PANEL WITH GFR - Abnormal; Notable for the following components:      Result Value   Glucose, Bld 138 (*)    Creatinine, Ser 1.04 (*)    GFR, Estimated 52 (*)  All other components within normal limits  CBC  TROPONIN I (HIGH SENSITIVITY)     EKG  ED ECG REPORT I, Waylon Cassis, the attending physician, personally viewed and interpreted this ECG.  Date: 06/08/2024 EKG Time: 1803 Rate: 92 Rhythm: normal sinus rhythm QRS Axis: normal Intervals: normal ST/T Wave abnormalities: Nonspecific ST abnormality Narrative Interpretation: no evidence of acute ischemia    RADIOLOGY  US  venous RLE:   IMPRESSION:  Negative for deep venous thrombosis in the right leg.    PROCEDURES:  Critical Care performed: No  Procedures   MEDICATIONS ORDERED IN ED: Medications - No data to display   IMPRESSION / MDM / ASSESSMENT AND PLAN / ED COURSE  I reviewed the triage vital signs and the nursing notes.  89 year old female with PMH as noted above presents with acute on chronic right leg pain.  She denies any acute swelling but has had somewhat chronic swelling to both legs.  She has no trauma.  On exam she is hypertensive.  Other vital signs are normal.  Exam of the right leg is reassuring, with some calf tenderness  but normal DP pulse, no cutaneous findings, and appears neuro/vascular intact.  Differential diagnosis includes, but is not limited to, peripheral edema, DVT, Baker's cyst, muscle strain or spasm, claudication due to PAD.  There is no evidence of acute vascular occlusion.  We will obtain basic labs, ultrasound, and reassess.  Patient's presentation is most consistent with acute presentation with potential threat to life or bodily function.  The patient is on the cardiac monitor to evaluate for evidence of arrhythmia and/or significant heart rate changes.   ----------------------------------------- 7:15 PM on 06/08/2024 -----------------------------------------  Ultrasound is negative for DVT.  Lab workup is reassuring.  Creatinine is normal.  Troponin is negative.  EKG is nonischemic.  The blood pressure has improved to the 150s systolic without intervention.  I had an extensive discussion with the patient and her family members.  At this time she is stable for discharge.  The patient feels comfortable going home.  Overall I suspect that she may have either musculoskeletal pain versus possible claudication.  There is no evidence of an acute arterial occlusion but she may have PVD.  There is no indication for further emergent workup.  I have provided vascular surgery referral.  I gave strict return precautions, and the patient and family members expressed understanding.    FINAL CLINICAL IMPRESSION(S) / ED DIAGNOSES   Final diagnoses:  Right leg pain     Rx / DC Orders   ED Discharge Orders     None        Note:  This document was prepared using Dragon voice recognition software and may include unintentional dictation errors.    Cassis Waylon, MD 06/08/24 [phone]

## 2024-06-08 NOTE — Discharge Instructions (Addendum)
 You do not have any signs of a blood clot in the leg.  The pain when you walk could be due to decreased circulation more chronically.  You should follow-up with the vascular surgeon for further evaluation.  In the meantime, return to the ER immediately for new, worsening, or persistent severe pain, weakness or numbness in the leg, if you cannot bear weight on it, if you have any discoloration of the leg, or any other new or worsening symptoms that concern you.

## 2024-06-23 ENCOUNTER — Other Ambulatory Visit (INDEPENDENT_AMBULATORY_CARE_PROVIDER_SITE_OTHER): Payer: Self-pay | Admitting: Vascular Surgery

## 2024-06-23 DIAGNOSIS — I739 Peripheral vascular disease, unspecified: Secondary | ICD-10-CM

## 2024-06-24 ENCOUNTER — Ambulatory Visit (INDEPENDENT_AMBULATORY_CARE_PROVIDER_SITE_OTHER)

## 2024-06-24 ENCOUNTER — Ambulatory Visit (INDEPENDENT_AMBULATORY_CARE_PROVIDER_SITE_OTHER): Admitting: Vascular Surgery

## 2024-06-24 ENCOUNTER — Encounter (INDEPENDENT_AMBULATORY_CARE_PROVIDER_SITE_OTHER)

## 2024-06-24 ENCOUNTER — Encounter (INDEPENDENT_AMBULATORY_CARE_PROVIDER_SITE_OTHER): Payer: Self-pay | Admitting: Vascular Surgery

## 2024-06-24 VITALS — BP 177/84 | HR 61 | Resp 18 | Ht 65.5 in | Wt 133.8 lb

## 2024-06-24 DIAGNOSIS — I739 Peripheral vascular disease, unspecified: Secondary | ICD-10-CM

## 2024-06-24 DIAGNOSIS — I10 Essential (primary) hypertension: Secondary | ICD-10-CM | POA: Diagnosis not present

## 2024-06-24 DIAGNOSIS — M79605 Pain in left leg: Secondary | ICD-10-CM

## 2024-06-24 DIAGNOSIS — M79604 Pain in right leg: Secondary | ICD-10-CM

## 2024-06-24 NOTE — Progress Notes (Unsigned)
 Patient ID: Renee Powell, female   DOB: 1933/10/07, 89 y.o.   MRN: [id number]  No chief complaint on file.   HPI Renee Powell is a 89 y.o. female.  I am asked to see the patient by *** for evaluation of ***.     Past Medical History:  Diagnosis Date   Actinic keratosis    Basal cell carcinoma 03/19/2022   right pretibia, [facility] 04/15/2022   Breast cancer (HCC) 2002   right lumpectomy with radiation   Cancer [facility]) 2002   breast   Hyperlipidemia    Personal history of radiation therapy 2002   Right breast   Pulmonary embolism (HCC)    Shingles of eyelid    Squamous cell carcinoma of skin 03/18/2023   left lat neck - SCCIS, ED&C   Thyroid  disease 09/2019   Pt had tumors treated with radiation therapy    Past Surgical History:  Procedure Laterality Date   BREAST BIOPSY Right 2002   breast ca radation   BREAST SURGERY Right 2002   lumpectomy   COLONOSCOPY WITH PROPOFOL  N/A 05/06/2017   Procedure: COLONOSCOPY WITH PROPOFOL ;  Surgeon: Therisa Bi, MD;  Location: [facility] ENDOSCOPY;  Service: Gastroenterology;  Laterality: N/A;   COLPOSCOPY       Family History  Problem Relation Age of Onset   Cancer Father        lung cancer   Cancer Maternal Uncle    Cancer Paternal Aunt        Breast   Cancer Paternal Uncle        Kidney   Cancer Paternal Uncle        Lung   Heart attack Mother    Cancer Paternal Grandmother        Breast   Breast cancer Maternal Aunt 60   Breast cancer Maternal Grandmother 60   ***   Social History   Tobacco Use   Smoking status: Never   Smokeless tobacco: Never  Vaping Use   Vaping status: Never Used  Substance Use Topics   Alcohol use: No    Alcohol/week: 0.0 standard drinks of alcohol   Drug use: No   ***  Allergies  Allergen Reactions   Latex     Current Outpatient Medications  Medication Sig Dispense Refill   amLODipine  (NORVASC ) 5 MG tablet Take 1 tablet (5 mg total) by mouth daily. 90 tablet 3   apixaban  (ELIQUIS )  5 MG TABS tablet Take 1 tablet (5 mg total) by mouth 2 (two) times daily. (Patient not taking: Reported on 04/25/2024) 60 tablet 1   bimatoprost (LUMIGAN) 0.01 % SOLN Place 1 drop into both eyes at bedtime.     brimonidine (ALPHAGAN) 0.2 % ophthalmic solution Place 1 drop into the left eye 3 (three) times daily.     carboxymethylcellulose (REFRESH PLUS) 0.5 % SOLN 1 drop 3 (three) times daily as needed.     dorzolamide-timolol (COSOPT) 2-0.5 % ophthalmic solution Place 1 drop into the left eye 2 (two) times daily.     latanoprost (XALATAN) 0.005 % ophthalmic solution 1 drop at bedtime.     levothyroxine  (SYNTHROID , LEVOTHROID) 88 MCG tablet Take 88 mcg daily before breakfast by mouth.     PREDNISOLONE ACETATE P-F 1 % ophthalmic suspension Place 1 drop into the left eye 3 (three) times daily.     No current facility-administered medications for this visit.      REVIEW OF SYSTEMS (Negative unless checked)  Constitutional: [] Weight loss  []   Fever  [] Chills Cardiac: [] Chest pain   [] Chest pressure   [] Palpitations   [] Shortness of breath when laying flat   [] Shortness of breath at rest   [] Shortness of breath with exertion. Vascular:  [] Pain in legs with walking   [] Pain in legs at rest   [] Pain in legs when laying flat   [] Claudication   [] Pain in feet when walking  [] Pain in feet at rest  [] Pain in feet when laying flat   [] History of DVT   [] Phlebitis   [] Swelling in legs   [] Varicose veins   [] Non-healing ulcers Pulmonary:   [] Uses home oxygen   [] Productive cough   [] Hemoptysis   [] Wheeze  [] COPD   [] Asthma Neurologic:  [] Dizziness  [] Blackouts   [] Seizures   [] History of stroke   [] History of TIA  [] Aphasia   [] Temporary blindness   [] Dysphagia   [] Weakness or numbness in arms   [] Weakness or numbness in legs Musculoskeletal:  [] Arthritis   [] Joint swelling   [] Joint pain   [] Low back pain Hematologic:  [] Easy bruising  [] Easy bleeding   [] Hypercoagulable state   [] Anemic   [] Hepatitis Gastrointestinal:  [] Blood in stool   [] Vomiting blood  [] Gastroesophageal reflux/heartburn   [] Abdominal pain Genitourinary:  [] Chronic kidney disease   [] Difficult urination  [] Frequent urination  [] Burning with urination   [] Hematuria Skin:  [] Rashes   [] Ulcers   [] Wounds Psychological:  [] History of anxiety   []  History of major depression.    Physical Exam There were no vitals taken for this visit. Gen:  WD/WN, NAD Head: /AT, No temporalis wasting. Prominent temp pulse not noted. Ear/Nose/Throat: Hearing grossly intact, nares w/o erythema or drainage, oropharynx w/o Erythema/Exudate Eyes: Conjunctiva clear, sclera non-icteric  Neck: trachea midline.  No JVD.  Pulmonary:  Good air movement, respirations not labored, no use of accessory muscles  Cardiac: RRR, no JVD Vascular: *** Vessel Right Left  Radial Palpable Palpable                                   Gastrointestinal:. No masses, surgical incisions, or scars. Musculoskeletal: M/S 5/5 throughout.  Extremities without ischemic changes.  No deformity or atrophy. *** edema. Neurologic: Sensation grossly intact in extremities.  Symmetrical.  Speech is fluent. Motor exam as listed above. Psychiatric: Judgment intact, Mood & affect appropriate for pt's clinical situation. Dermatologic: No rashes or ulcers noted.  No cellulitis or open wounds.    Radiology US  Venous Img Lower Unilateral Right Result Date: 06/08/2024 CLINICAL DATA:  leg pain EXAM: RIGHT LOWER EXTREMITY VENOUS DOPPLER ULTRASOUND TECHNIQUE: Gray-scale sonography with graded compression, as well as color Doppler and duplex ultrasound were performed to evaluate the lower extremity deep venous systems from the level of the common femoral vein and including the common femoral, femoral, profunda femoral, popliteal and calf veins including the posterior tibial, peroneal and gastrocnemius veins when visible. The superficial great saphenous vein was also  interrogated. Spectral Doppler was utilized to evaluate flow at rest and with distal augmentation maneuvers in the common femoral, femoral and popliteal veins. COMPARISON:  None Available. FINDINGS: Contralateral Common Femoral Vein: Respiratory phasicity is normal and symmetric with the symptomatic side. No evidence of thrombus. Normal compressibility. Common Femoral Vein: No evidence of thrombus. Normal compressibility, respiratory phasicity and response to augmentation. Saphenofemoral Junction: No evidence of thrombus. Normal compressibility and flow on color Doppler imaging. Profunda Femoral Vein: No evidence of thrombus. Normal  compressibility and flow on color Doppler imaging. Femoral Vein: No evidence of thrombus. Normal compressibility, respiratory phasicity and response to augmentation. Popliteal Vein: No evidence of thrombus. Normal compressibility, respiratory phasicity and response to augmentation. Calf Veins: No evidence of thrombus. Normal compressibility and flow on color Doppler imaging. Superficial Great Saphenous Vein: No evidence of thrombus. Normal compressibility. Other Findings: Anechoic fluid collection in the popliteal fossa, measuring 3.3 x 2 x 1.1 cm, likely a popliteal fossa Baker's cyst. IMPRESSION: Negative for deep venous thrombosis in the right leg. Electronically Signed   By: Rogelia Myers M.D.   On: 06/08/2024 18:51    Labs Recent Results (from the past 2160 hours)  Basic metabolic panel     Status: Abnormal   Collection Time: 06/08/24  6:00 PM  Result Value Ref Range   Sodium 142 135 - 145 mmol/L   Potassium 3.6 3.5 - 5.1 mmol/L   Chloride 108 98 - 111 mmol/L   CO2 23 22 - 32 mmol/L   Glucose, Bld 138 (H) 70 - 99 mg/dL    Comment: Glucose reference range applies only to samples taken after fasting for at least 8 hours.   BUN 16 8 - 23 mg/dL   Creatinine, Ser 8.95 (H) 0.44 - 1.00 mg/dL   Calcium  9.3 8.9 - 10.3 mg/dL   GFR, Estimated 52 (L) >60 mL/min    Comment:  (NOTE) Calculated using the CKD-EPI Creatinine Equation (2021)    Anion gap 11 5 - 15    Comment: Performed at [facility], [address] [place], [place] [address]  CBC     Status: None   Collection Time: 06/08/24  6:00 PM  Result Value Ref Range   WBC 8.1 4.0 - 10.5 K/uL   RBC 4.63 3.87 - 5.11 MIL/uL   Hemoglobin 13.9 12.0 - 15.0 g/dL   HCT 57.1 63.9 - 53.9 %   MCV 92.4 80.0 - 100.0 fL   MCH 30.0 26.0 - 34.0 pg   MCHC 32.5 30.0 - 36.0 g/dL   RDW 86.0 88.4 - 84.4 %   Platelets 209 150 - 400 K/uL   nRBC 0.0 0.0 - 0.2 %    Comment: Performed at [facility], [address], [place], [place] [address]  Troponin I (High Sensitivity)     Status: None   Collection Time: 06/08/24  6:00 PM  Result Value Ref Range   Troponin I (High Sensitivity) 2 <18 ng/L    Comment: (NOTE) Elevated high sensitivity troponin I (hsTnI) values and significant  changes across serial measurements may suggest ACS but many other  chronic and acute conditions are known to elevate hsTnI results.  Refer to the Links section for chest pain algorithms and additional  guidance. Performed at [facility], [address] [place], [place] [address]     Assessment/Plan:  No problem-specific Assessment & Plan notes found for this encounter.      Selinda Gu 06/24/2024, 8:31 AM   This note was created with Dragon medical transcription system.  Any errors from dictation are unintentional.

## 2024-06-26 DIAGNOSIS — M79609 Pain in unspecified limb: Secondary | ICD-10-CM | POA: Insufficient documentation

## 2024-06-26 NOTE — Assessment & Plan Note (Signed)
 We performed ABIs today to evaluate for possible arterial insufficiency.  These demonstrated normal ABIs of 1.03 on the right and 1.08 on the left with triphasic waveforms and normal digital pressures consistent with no arterial insufficiency.   She had a negative DVT study earlier this month. I do not believe her lower extremity symptoms are likely related to vascular disease.  Neurogenic or musculoskeletal causes are much more likely.  I will see her back as needed.

## 2024-06-26 NOTE — Assessment & Plan Note (Signed)
 blood pressure control important in reducing the progression of atherosclerotic disease. On appropriate oral medications.

## 2024-07-28 ENCOUNTER — Ambulatory Visit (INDEPENDENT_AMBULATORY_CARE_PROVIDER_SITE_OTHER)

## 2024-07-28 DIAGNOSIS — D692 Other nonthrombocytopenic purpura: Secondary | ICD-10-CM | POA: Diagnosis not present

## 2024-07-28 DIAGNOSIS — C44629 Squamous cell carcinoma of skin of left upper limb, including shoulder: Secondary | ICD-10-CM

## 2024-07-28 DIAGNOSIS — D485 Neoplasm of uncertain behavior of skin: Secondary | ICD-10-CM | POA: Diagnosis not present

## 2024-07-28 NOTE — Patient Instructions (Addendum)

## 2024-07-28 NOTE — Progress Notes (Signed)
    Subjective   Renee Powell is a 89 y.o. female who presents for the following: Lesion(s) of concern . Patient is established patient   Today patient reports: Irregular skin lesion on the hand x 2 weeks, pt concerned and would like it checked today.  Review of Systems:    No other skin or systemic complaints except as noted in HPI or Assessment and Plan.  The following portions of the chart were reviewed this encounter and updated as appropriate: medications, allergies, medical history  Relevant Medical History:  Personal history of non melanoma skin cancer - see medical history for full details   Objective  (SKPE) Well appearing patient in no apparent distress; mood and affect are within normal limits. Examination was performed of the: Focused Exam of: the hands   Examination notable for: ---1.0 cm pink scaly plaque  Solar purpura  Examination limited by: n/a   L dorsal hand 1.0 cm pink scaly plaque.   Assessment & Plan  (SKAP)   Solar purpura - Chronic; persistent and recurrent.  Treatable, but not curable. - Violaceous macules and patches - Benign - Related to trauma, age, sun damage and/or use of blood thinners, chronic use of topical and/or oral steroids - Observe - Can use OTC arnica containing moisturizer such as Dermend Bruise Formula if desired - Call for worsening or other concerns  Level of service outlined above   Patient instructions (SKPI)   Procedures, orders, diagnosis for this visit:  NEOPLASM OF UNCERTAIN BEHAVIOR OF SKIN L dorsal hand Skin / nail biopsy Type of biopsy: tangential   Informed consent: discussed and consent obtained   Timeout: patient name, date of birth, surgical site, and procedure verified   Procedure prep:  Patient was prepped and draped in usual sterile fashion Prep type:  Isopropyl alcohol Anesthesia: the lesion was anesthetized in a standard fashion   Anesthetic:  1% lidocaine  w/ epinephrine 1-100,000 buffered w/ 8.4%  NaHCO3 Instrument used: flexible razor blade   Hemostasis achieved with: pressure, aluminum chloride and electrodesiccation   Outcome: patient tolerated procedure well   Post-procedure details: sterile dressing applied and wound care instructions given   Dressing type: bandage (Mupirocin  2% ointment)    Specimen 1 - Surgical pathology Differential Diagnosis: D48.5 r/o ISK vs SCC vs AK Check Margins: No  Neoplasm of uncertain behavior of skin -     Skin / nail biopsy -     Surgical pathology; Standing   Return to clinic: Return for appointment as scheduled.  LILLETTE Rosina Mayans, CMA, am acting as scribe for Lauraine JAYSON Kanaris, MD .  Documentation: I have reviewed the above documentation for accuracy and completeness, and I agree with the above.  Lauraine JAYSON Kanaris, MD

## 2024-07-29 LAB — SURGICAL PATHOLOGY

## 2024-08-02 ENCOUNTER — Ambulatory Visit: Payer: Self-pay

## 2024-08-02 DIAGNOSIS — C44629 Squamous cell carcinoma of skin of left upper limb, including shoulder: Secondary | ICD-10-CM

## 2024-08-02 NOTE — Telephone Encounter (Signed)
 Lft pt msg to call for bx results/sh

## 2024-08-02 NOTE — Telephone Encounter (Signed)
-----   Message from Lauraine JAYSON Kanaris sent at 08/02/2024 11:14 AM EST -----    1. Skin, left dorsal hand :       WELL DIFFERENTIATED SQUAMOUS CELL CARCINOMA WITH SUPERFICIAL INFILTRATION   Please notify patient with below plan: Mohs  ----- Message ----- From: Interface, Lab In Three [facility] Seven Sent: 07/29/2024   3:35 PM EST To: Lauraine JAYSON Kanaris, MD

## 2024-08-08 NOTE — Addendum Note (Signed)
 Addended by: TANDA SETTER A on: 08/08/2024 09:54 AM   Modules accepted: Orders

## 2024-08-08 NOTE — Telephone Encounter (Addendum)
 Patient returned call regarding message left. Discussed results with patient. She prefers Mohs at Dr. Core[facility] in [place].  ----- Message from Lauraine JAYSON Kanaris sent at 08/02/2024 11:14 AM EST -----    1. Skin, left dorsal hand :       WELL DIFFERENTIATED SQUAMOUS CELL CARCINOMA WITH SUPERFICIAL INFILTRATION   Please notify patient with below plan: Mohs  ----- Message ----- From: Interface, Lab In Three [facility] Seven Sent: 07/29/2024   3:35 PM EST To: Lauraine JAYSON Kanaris, MD

## 2024-08-09 ENCOUNTER — Ambulatory Visit: Admitting: Obstetrics & Gynecology

## 2024-08-09 ENCOUNTER — Encounter: Payer: Self-pay | Admitting: Obstetrics & Gynecology

## 2024-08-09 VITALS — BP 142/79 | HR 79 | Ht 65.5 in | Wt 135.8 lb

## 2024-08-09 DIAGNOSIS — N814 Uterovaginal prolapse, unspecified: Secondary | ICD-10-CM | POA: Diagnosis not present

## 2024-08-09 DIAGNOSIS — Z4689 Encounter for fitting and adjustment of other specified devices: Secondary | ICD-10-CM | POA: Diagnosis not present

## 2024-08-09 DIAGNOSIS — N8110 Cystocele, unspecified: Secondary | ICD-10-CM

## 2024-08-09 NOTE — Progress Notes (Signed)
    GYNECOLOGY PROGRESS NOTE  Subjective:    Patient ID: Renee Powell, female    DOB: 1934/04/25, 89 y.o.   MRN: [id number]  HPI  Patient is a 89 y.o. [id number] here for pessary maintenance. She was last seen here 04/2024. She is happy with her pessary. She reports normal bowel and bladder function.  The following portions of the patient's history were reviewed and updated as appropriate: allergies, current medications, past family history, past medical history, past social history, past surgical history, and problem list.  Review of Systems Pertinent items are noted in HPI.   Objective:   Blood pressure (!) 142/79, pulse 79, height 5' 5.5 (1.664 m), weight 135 lb 12.8 oz (61.6 kg). Body mass index is 22.25 kg/m. Well nourished, well hydrated White female, no apparent distress She is ambulating and conversing normally. EG and vagina with expected atrophy. I removed her #5 ring with support and it was cleaned with soap and water. I inspected the vagina with a Graves speculum. There were no vaginal erosions or other abnormalities. I replaced the pessary. She tolerated this well.  Assessment:   1. Baden-Walker grade 4 cystocele   2. Uterine prolapse   3. Pessary maintenance      Plan:   1. Baden-Walker grade 4 cystocele (Primary)   2. Uterine prolapse   3. Pessary maintenance - every 3 months/as necessary sooner

## 2024-08-12 ENCOUNTER — Other Ambulatory Visit: Payer: Self-pay | Admitting: Internal Medicine

## 2024-08-12 DIAGNOSIS — Z1231 Encounter for screening mammogram for malignant neoplasm of breast: Secondary | ICD-10-CM

## 2024-10-03 ENCOUNTER — Encounter: Admitting: Dermatology

## 2024-10-18 ENCOUNTER — Encounter: Payer: Medicare Other | Admitting: Dermatology

## 2024-10-20 ENCOUNTER — Encounter: Admitting: Dermatology

## 2024-11-01 ENCOUNTER — Encounter: Admitting: Dermatology

## 2024-11-15 ENCOUNTER — Ambulatory Visit: Admitting: Obstetrics & Gynecology
